# Patient Record
Sex: Male | Born: 1935
Health system: Southern US, Community
[De-identification: ages and names within clinical notes are randomized; demographics above are authoritative.]

## PROBLEM LIST (undated history)

## (undated) DIAGNOSIS — D759 Disease of blood and blood-forming organs, unspecified: Secondary | ICD-10-CM

## (undated) DIAGNOSIS — K219 Gastro-esophageal reflux disease without esophagitis: Secondary | ICD-10-CM

## (undated) DIAGNOSIS — R310 Gross hematuria: Secondary | ICD-10-CM

## (undated) DIAGNOSIS — I370 Nonrheumatic pulmonary valve stenosis: Secondary | ICD-10-CM

## (undated) DIAGNOSIS — I10 Essential (primary) hypertension: Secondary | ICD-10-CM

## (undated) DIAGNOSIS — N529 Male erectile dysfunction, unspecified: Secondary | ICD-10-CM

## (undated) DIAGNOSIS — Z87442 Personal history of urinary calculi: Secondary | ICD-10-CM

## (undated) DIAGNOSIS — N486 Induration penis plastica: Secondary | ICD-10-CM

## (undated) DIAGNOSIS — I480 Paroxysmal atrial fibrillation: Secondary | ICD-10-CM

## (undated) DIAGNOSIS — E785 Hyperlipidemia, unspecified: Secondary | ICD-10-CM

## (undated) HISTORY — PX: OTHER SURGICAL HISTORY: SHX169

## (undated) HISTORY — DX: Paroxysmal atrial fibrillation: I48.0

## (undated) HISTORY — DX: Induration penis plastica: N48.6

## (undated) HISTORY — PX: APPENDECTOMY: SHX54

## (undated) HISTORY — DX: Male erectile dysfunction, unspecified: N52.9

## (undated) HISTORY — DX: Hyperlipidemia, unspecified: E78.5

## (undated) HISTORY — DX: Gross hematuria: R31.0

## (undated) HISTORY — PX: CHOLECYSTECTOMY: SHX55

## (undated) HISTORY — PX: EYE SURGERY: SHX253

## (undated) HISTORY — DX: Nonrheumatic pulmonary valve stenosis: I37.0

---

## 1999-04-10 ENCOUNTER — Ambulatory Visit (HOSPITAL_COMMUNITY): Admission: RE | Admit: 1999-04-10 | Discharge: 1999-04-10 | Payer: Self-pay | Admitting: Internal Medicine

## 2003-12-23 ENCOUNTER — Ambulatory Visit (HOSPITAL_COMMUNITY): Admission: RE | Admit: 2003-12-23 | Discharge: 2003-12-23 | Payer: Self-pay | Admitting: Internal Medicine

## 2004-08-04 ENCOUNTER — Encounter (HOSPITAL_COMMUNITY): Admission: RE | Admit: 2004-08-04 | Discharge: 2004-11-02 | Payer: Self-pay | Admitting: Internal Medicine

## 2005-09-19 ENCOUNTER — Ambulatory Visit: Payer: Self-pay | Admitting: Oncology

## 2006-04-12 ENCOUNTER — Ambulatory Visit: Payer: Self-pay | Admitting: Oncology

## 2006-07-07 ENCOUNTER — Ambulatory Visit: Payer: Self-pay | Admitting: Oncology

## 2007-08-03 ENCOUNTER — Ambulatory Visit: Payer: Self-pay | Admitting: Oncology

## 2008-03-15 ENCOUNTER — Ambulatory Visit: Payer: Self-pay | Admitting: Oncology

## 2008-05-23 ENCOUNTER — Ambulatory Visit: Payer: Self-pay | Admitting: Oncology

## 2009-08-07 ENCOUNTER — Ambulatory Visit: Payer: Self-pay | Admitting: Oncology

## 2010-01-08 ENCOUNTER — Ambulatory Visit: Payer: Self-pay | Admitting: Oncology

## 2010-06-24 ENCOUNTER — Encounter (HOSPITAL_COMMUNITY): Payer: Self-pay

## 2010-06-25 ENCOUNTER — Encounter (HOSPITAL_COMMUNITY): Payer: Self-pay

## 2010-06-25 ENCOUNTER — Ambulatory Visit (HOSPITAL_COMMUNITY): Payer: Medicare Other | Attending: Internal Medicine

## 2010-07-07 ENCOUNTER — Ambulatory Visit (HOSPITAL_COMMUNITY): Payer: Medicare Other | Attending: Internal Medicine

## 2010-11-24 ENCOUNTER — Ambulatory Visit (HOSPITAL_COMMUNITY): Payer: Medicare Other | Attending: Internal Medicine

## 2011-06-14 DIAGNOSIS — H401390 Pigmentary glaucoma, unspecified eye, stage unspecified: Secondary | ICD-10-CM | POA: Diagnosis not present

## 2011-06-23 DIAGNOSIS — Z125 Encounter for screening for malignant neoplasm of prostate: Secondary | ICD-10-CM | POA: Diagnosis not present

## 2011-06-23 DIAGNOSIS — K4090 Unilateral inguinal hernia, without obstruction or gangrene, not specified as recurrent: Secondary | ICD-10-CM | POA: Diagnosis not present

## 2011-06-23 DIAGNOSIS — I10 Essential (primary) hypertension: Secondary | ICD-10-CM | POA: Diagnosis not present

## 2011-06-23 DIAGNOSIS — Z0000 Encounter for general adult medical examination without abnormal findings: Secondary | ICD-10-CM | POA: Diagnosis not present

## 2011-06-23 DIAGNOSIS — E785 Hyperlipidemia, unspecified: Secondary | ICD-10-CM | POA: Diagnosis not present

## 2011-07-07 ENCOUNTER — Other Ambulatory Visit (HOSPITAL_COMMUNITY): Payer: Self-pay | Admitting: *Deleted

## 2011-07-09 ENCOUNTER — Encounter (HOSPITAL_COMMUNITY)
Admission: RE | Admit: 2011-07-09 | Discharge: 2011-07-09 | Disposition: A | Discharge status: Home / Self Care | Payer: Medicare Other | Source: Ambulatory Visit | Attending: Internal Medicine | Admitting: Internal Medicine

## 2011-07-09 ENCOUNTER — Encounter (HOSPITAL_COMMUNITY): Payer: Self-pay

## 2011-07-09 HISTORY — DX: Essential (primary) hypertension: I10

## 2011-07-09 HISTORY — DX: Disease of blood and blood-forming organs, unspecified: D75.9

## 2011-07-09 NOTE — Procedures (Signed)
Phlebotomized for 500cc whole blood over 15 minutes. Pt tolerated well.

## 2011-07-09 NOTE — Discharge Instructions (Signed)

## 2011-07-20 ENCOUNTER — Encounter (INDEPENDENT_AMBULATORY_CARE_PROVIDER_SITE_OTHER): Payer: Self-pay

## 2011-07-20 DIAGNOSIS — K4090 Unilateral inguinal hernia, without obstruction or gangrene, not specified as recurrent: Secondary | ICD-10-CM | POA: Insufficient documentation

## 2011-07-21 ENCOUNTER — Ambulatory Visit (INDEPENDENT_AMBULATORY_CARE_PROVIDER_SITE_OTHER): Payer: Medicare Other | Admitting: Surgery

## 2011-07-21 ENCOUNTER — Encounter (INDEPENDENT_AMBULATORY_CARE_PROVIDER_SITE_OTHER): Payer: Self-pay | Admitting: Surgery

## 2011-07-21 DIAGNOSIS — K4090 Unilateral inguinal hernia, without obstruction or gangrene, not specified as recurrent: Secondary | ICD-10-CM

## 2011-07-21 NOTE — Progress Notes (Signed)
Re:   [name] DOB:   [birth year] MRN:   [id number]  ASSESSMENT AND PLAN: 1.  Left inguinal hernia.  Reducible.  I discussed the indications and complications of hernia surgery with the patient.  I discussed both the laparoscopic and open approach to hernia repair..  The potential risks of hernia surgery include, but are not limited to, bleeding, infection, open surgery, nerve injury, and recurrence of the hernia.  I provided the patient literature about hernia surgery.  He is undecided about laparoscopic vs open hernia repair - he has literature for both.  He is accompanied with his wife.  He is [place] trip [place] in about one month and he is trying to decide the timing of the hernia repair.  2.  HTN 3.  Hemochromatosis.  He gets phlebotomies every 6 months. 4.  Hyperlipidemia. 5.  Nephrolithiasis.  But has not had trouble in some time. 6.  Left groin mass - lipoma vs node - benign 7.  Small hernia at RLQ incision site.  Chief Complaint  Patient presents with  . Inguinal Hernia    left   REFERRING PHYSICIAN: Lillia Mountain, MD, MD  HISTORY OF PRESENT ILLNESS: Brian Horn is a 76 y.o. (DOB: [birth year])  white male whose primary care physician is Lillia Mountain, MD, MD and comes to me today for left inguinal hernia.  He noticed the hernia about 6 months ago.  It bothers him a little, but not a lot.  It is reducible.  It does not influence his activity. He has had no prior hernia repair.  No history of stomach disease.  No history of liver disease.  Open cholecystectomy - [id number].  Open appendectomy -  1970's. No pancreas disease.  No history of colon disease.  Had colonoscopy about 5 years ago, but cannot remember the GI doctor who did the procedure.   Past Medical History  Diagnosis Date  . Blood dyscrasia     hemachromatosis  . Hypertension   . Glaucoma   . ED (erectile dysfunction)   . Hyperlipidemia   . Pulmonic stenosis     mild  .  Paroxysmal atrial fibrillation   . Nephrolithiasis   . Tinnitus   . Hematuria, gross   . Renal cyst, left   . Peyronie's disease   . Inguinal hernia       Past Surgical History  Procedure Date  . Appendectomy   . Cholecystectomy   . Eye surgery       Current Outpatient Prescriptions  Medication Sig Dispense Refill  . amLODipine-benazepril (LOTREL) 10-20 MG per capsule Take 1 capsule by mouth daily.      . Aspirin 81 MG EC tablet Take 81 mg by mouth daily.      . lansoprazole (PREVACID) 15 MG capsule Take 15 mg by mouth daily before breakfast.      . latanoprost (XALATAN) 0.005 % ophthalmic solution 1 drop at bedtime.      . Multiple Vitamins-Minerals (MULTIVITAMIN PO) Take by mouth as directed.      . simvastatin (ZOCOR) 40 MG tablet Take 40 mg by mouth every evening.      . vardenafil (LEVITRA) 20 MG tablet Take 20 mg by mouth daily as needed.          Allergies  Allergen Reactions  . Lipitor (Atorvastatin Calcium)     cramping    REVIEW OF SYSTEMS: Skin:  No history of rash.  No history of abnormal moles.  Infection:  No history of hepatitis or HIV.  No history of MRSA. Neurologic:  No history of stroke.  No history of seizure.  No history of headaches. Cardiac:  Hpertension. No history of heart disease.  No history of prior cardiac catheterization.  Pulmonary:  Does not smoke cigarettes.  No asthma or bronchitis.  No OSA/CPAP.  Endocrine:  No diabetes. No thyroid disease.  Hypercholesterolemia. Gastrointestinal:  See HPI. Urologic:  Nephrolithiasis. Musculoskeletal:  No history of joint or back disease. Hematologic:  Hemochromatosis - does phlebotomy about twice a year. Psycho-social:  The patient is oriented.   The patient has no obvious psychologic or social impairment to understanding our conversation and plan.  SOCIAL and FAMILY HISTORY: Married.  Wife, Mitzi Davenport, with patient.  PHYSICAL EXAM: BP 122/82  Pulse 72  Temp(Src) 98 F (36.7 C) (Temporal)  Resp  18  Ht 6' (1.829 m)  Wt 184 lb (83.462 kg)  BMI 24.95 kg/m2  General: WN older WM who is alert and generally healthy appearing.  HEENT: Normal. Pupils equal. Good dentition. Neck: Supple. No mass.  No thyroid mass.  Carotid pulse okay with no bruit. Lymph Nodes:  No supraclavicular or cervical nodes. Lungs: Clear to auscultation and symmetric breath sounds. Heart:  RRR. No murmur or rub.  Abdomen: Soft. No mass. No tenderness. Small to medium left inguinal hernia.  Left inguinal mass he has had for 15 years (lipoma vs lymph node) - feels benign.  RUQ incision and RLQ incision (appendectomy) with small hernia.  Rectal: Not done. Extremities:  Good strength and ROM  in upper and lower extremities. Neurologic:  Grossly intact to motor and sensory function. Psychiatric: Has normal mood and affect. Behavior is normal.   DATA REVIEWED: Dr. Jone Baseman notes.  Ovidio Kin, MD,  [facility] [address]  [address]   [place], [place]    [address] Phone:  [phone] FAX:  [phone]

## 2011-08-06 DIAGNOSIS — K4090 Unilateral inguinal hernia, without obstruction or gangrene, not specified as recurrent: Secondary | ICD-10-CM | POA: Diagnosis not present

## 2011-08-09 ENCOUNTER — Encounter (INDEPENDENT_AMBULATORY_CARE_PROVIDER_SITE_OTHER): Payer: Self-pay | Admitting: Surgery

## 2011-08-19 ENCOUNTER — Ambulatory Visit (INDEPENDENT_AMBULATORY_CARE_PROVIDER_SITE_OTHER): Payer: Medicare Other | Admitting: Surgery

## 2011-08-19 DIAGNOSIS — K4090 Unilateral inguinal hernia, without obstruction or gangrene, not specified as recurrent: Secondary | ICD-10-CM

## 2011-08-19 NOTE — Progress Notes (Signed)
Post op visit  Laparoscopic left inguinal hernia repair at [facility] on 08/06/2011.  BP 130/82  Pulse 72  Temp(Src) 97.1 F (36.2 C) (Temporal)  Resp 12  Ht 6' (1.829 m)  Wt 181 lb 8 oz (82.328 kg)  BMI 24.62 kg/m2  Had bruising of scrotum/penis early.  That is better.  He is other wise doing well.  Return to office PRN.  Ovidio Kin, MD, [facility] Pager: [phone] Office phone:  [phone]

## 2011-11-12 DIAGNOSIS — H401390 Pigmentary glaucoma, unspecified eye, stage unspecified: Secondary | ICD-10-CM | POA: Diagnosis not present

## 2011-11-12 DIAGNOSIS — Z961 Presence of intraocular lens: Secondary | ICD-10-CM | POA: Diagnosis not present

## 2011-11-24 DIAGNOSIS — N486 Induration penis plastica: Secondary | ICD-10-CM | POA: Diagnosis not present

## 2011-11-24 DIAGNOSIS — N529 Male erectile dysfunction, unspecified: Secondary | ICD-10-CM | POA: Diagnosis not present

## 2011-12-21 DIAGNOSIS — IMO0001 Reserved for inherently not codable concepts without codable children: Secondary | ICD-10-CM | POA: Diagnosis not present

## 2011-12-21 DIAGNOSIS — I10 Essential (primary) hypertension: Secondary | ICD-10-CM | POA: Diagnosis not present

## 2011-12-21 DIAGNOSIS — K219 Gastro-esophageal reflux disease without esophagitis: Secondary | ICD-10-CM | POA: Diagnosis not present

## 2011-12-21 DIAGNOSIS — N529 Male erectile dysfunction, unspecified: Secondary | ICD-10-CM | POA: Diagnosis not present

## 2012-02-05 DIAGNOSIS — Z23 Encounter for immunization: Secondary | ICD-10-CM | POA: Diagnosis not present

## 2012-02-29 DIAGNOSIS — M25569 Pain in unspecified knee: Secondary | ICD-10-CM | POA: Diagnosis not present

## 2012-04-14 ENCOUNTER — Encounter (HOSPITAL_COMMUNITY): Payer: Medicare Other

## 2012-04-27 ENCOUNTER — Encounter (HOSPITAL_COMMUNITY): Payer: Medicare Other

## 2012-05-02 ENCOUNTER — Other Ambulatory Visit (HOSPITAL_COMMUNITY): Payer: Self-pay | Admitting: Internal Medicine

## 2012-05-04 ENCOUNTER — Encounter (HOSPITAL_COMMUNITY)
Admission: RE | Admit: 2012-05-04 | Discharge: 2012-05-04 | Disposition: A | Discharge status: Home / Self Care | Payer: Medicare Other | Source: Ambulatory Visit | Attending: Internal Medicine | Admitting: Internal Medicine

## 2012-05-04 NOTE — Procedures (Addendum)
After 2 attempts at peripheral access was able to only withdraw 200 cc of blood. Pt wishes to leave at this time because he is feeling fine and has an upcomming appointment with Dr Valentina Lucks to have his ferritin level checked . Placed call to Dr Valentina Lucks and is OK for patient to leave with only obtaining 200 cc blood. Peripheral catheter removed with cath intact and dressing placed at site

## 2012-05-10 DIAGNOSIS — H401390 Pigmentary glaucoma, unspecified eye, stage unspecified: Secondary | ICD-10-CM | POA: Diagnosis not present

## 2012-05-10 DIAGNOSIS — H409 Unspecified glaucoma: Secondary | ICD-10-CM | POA: Diagnosis not present

## 2012-06-20 DIAGNOSIS — E785 Hyperlipidemia, unspecified: Secondary | ICD-10-CM | POA: Diagnosis not present

## 2012-06-20 DIAGNOSIS — I10 Essential (primary) hypertension: Secondary | ICD-10-CM | POA: Diagnosis not present

## 2012-06-20 DIAGNOSIS — N529 Male erectile dysfunction, unspecified: Secondary | ICD-10-CM | POA: Diagnosis not present

## 2012-06-20 DIAGNOSIS — K219 Gastro-esophageal reflux disease without esophagitis: Secondary | ICD-10-CM | POA: Diagnosis not present

## 2012-07-18 DIAGNOSIS — I10 Essential (primary) hypertension: Secondary | ICD-10-CM | POA: Diagnosis not present

## 2012-11-14 DIAGNOSIS — H409 Unspecified glaucoma: Secondary | ICD-10-CM | POA: Diagnosis not present

## 2012-11-14 DIAGNOSIS — H26499 Other secondary cataract, unspecified eye: Secondary | ICD-10-CM | POA: Diagnosis not present

## 2012-11-14 DIAGNOSIS — H401390 Pigmentary glaucoma, unspecified eye, stage unspecified: Secondary | ICD-10-CM | POA: Diagnosis not present

## 2012-11-21 DIAGNOSIS — H26499 Other secondary cataract, unspecified eye: Secondary | ICD-10-CM | POA: Diagnosis not present

## 2012-11-21 DIAGNOSIS — H2640 Unspecified secondary cataract: Secondary | ICD-10-CM | POA: Diagnosis not present

## 2012-12-07 DIAGNOSIS — Z1331 Encounter for screening for depression: Secondary | ICD-10-CM | POA: Diagnosis not present

## 2012-12-07 DIAGNOSIS — I10 Essential (primary) hypertension: Secondary | ICD-10-CM | POA: Diagnosis not present

## 2013-02-12 DIAGNOSIS — Z23 Encounter for immunization: Secondary | ICD-10-CM | POA: Diagnosis not present

## 2013-02-13 DIAGNOSIS — N401 Enlarged prostate with lower urinary tract symptoms: Secondary | ICD-10-CM | POA: Diagnosis not present

## 2013-02-13 DIAGNOSIS — R3129 Other microscopic hematuria: Secondary | ICD-10-CM | POA: Diagnosis not present

## 2013-02-13 DIAGNOSIS — N529 Male erectile dysfunction, unspecified: Secondary | ICD-10-CM | POA: Diagnosis not present

## 2013-02-13 DIAGNOSIS — N138 Other obstructive and reflux uropathy: Secondary | ICD-10-CM | POA: Diagnosis not present

## 2013-03-28 DIAGNOSIS — N139 Obstructive and reflux uropathy, unspecified: Secondary | ICD-10-CM | POA: Diagnosis not present

## 2013-03-28 DIAGNOSIS — N529 Male erectile dysfunction, unspecified: Secondary | ICD-10-CM | POA: Diagnosis not present

## 2013-03-28 DIAGNOSIS — N401 Enlarged prostate with lower urinary tract symptoms: Secondary | ICD-10-CM | POA: Diagnosis not present

## 2013-03-28 DIAGNOSIS — N138 Other obstructive and reflux uropathy: Secondary | ICD-10-CM | POA: Diagnosis not present

## 2013-06-29 DIAGNOSIS — I10 Essential (primary) hypertension: Secondary | ICD-10-CM | POA: Diagnosis not present

## 2013-06-29 DIAGNOSIS — L989 Disorder of the skin and subcutaneous tissue, unspecified: Secondary | ICD-10-CM | POA: Diagnosis not present

## 2013-06-29 DIAGNOSIS — Z125 Encounter for screening for malignant neoplasm of prostate: Secondary | ICD-10-CM | POA: Diagnosis not present

## 2013-06-29 DIAGNOSIS — E785 Hyperlipidemia, unspecified: Secondary | ICD-10-CM | POA: Diagnosis not present

## 2013-06-29 DIAGNOSIS — Z23 Encounter for immunization: Secondary | ICD-10-CM | POA: Diagnosis not present

## 2013-06-29 DIAGNOSIS — Z0000 Encounter for general adult medical examination without abnormal findings: Secondary | ICD-10-CM | POA: Diagnosis not present

## 2013-06-29 DIAGNOSIS — K219 Gastro-esophageal reflux disease without esophagitis: Secondary | ICD-10-CM | POA: Diagnosis not present

## 2013-06-29 DIAGNOSIS — R972 Elevated prostate specific antigen [PSA]: Secondary | ICD-10-CM | POA: Diagnosis not present

## 2013-06-29 DIAGNOSIS — Z1331 Encounter for screening for depression: Secondary | ICD-10-CM | POA: Diagnosis not present

## 2013-07-02 DIAGNOSIS — D239 Other benign neoplasm of skin, unspecified: Secondary | ICD-10-CM | POA: Diagnosis not present

## 2013-07-02 DIAGNOSIS — D1801 Hemangioma of skin and subcutaneous tissue: Secondary | ICD-10-CM | POA: Diagnosis not present

## 2013-07-02 DIAGNOSIS — L821 Other seborrheic keratosis: Secondary | ICD-10-CM | POA: Diagnosis not present

## 2013-07-02 DIAGNOSIS — C4359 Malignant melanoma of other part of trunk: Secondary | ICD-10-CM | POA: Diagnosis not present

## 2013-07-10 DIAGNOSIS — H409 Unspecified glaucoma: Secondary | ICD-10-CM | POA: Diagnosis not present

## 2013-07-10 DIAGNOSIS — Z961 Presence of intraocular lens: Secondary | ICD-10-CM | POA: Diagnosis not present

## 2013-07-10 DIAGNOSIS — H2640 Unspecified secondary cataract: Secondary | ICD-10-CM | POA: Diagnosis not present

## 2013-07-10 DIAGNOSIS — H401390 Pigmentary glaucoma, unspecified eye, stage unspecified: Secondary | ICD-10-CM | POA: Diagnosis not present

## 2013-07-17 DIAGNOSIS — C4359 Malignant melanoma of other part of trunk: Secondary | ICD-10-CM | POA: Diagnosis not present

## 2013-07-24 ENCOUNTER — Other Ambulatory Visit (HOSPITAL_COMMUNITY): Payer: Self-pay | Admitting: *Deleted

## 2013-07-24 ENCOUNTER — Encounter (HOSPITAL_COMMUNITY)
Admission: RE | Admit: 2013-07-24 | Discharge: 2013-07-24 | Disposition: A | Discharge status: Home / Self Care | Payer: Medicare Other | Source: Ambulatory Visit | Attending: Internal Medicine | Admitting: Internal Medicine

## 2013-07-24 ENCOUNTER — Encounter (HOSPITAL_COMMUNITY): Payer: Self-pay

## 2013-07-24 ENCOUNTER — Other Ambulatory Visit (HOSPITAL_COMMUNITY): Payer: Self-pay | Admitting: Internal Medicine

## 2013-07-24 NOTE — Procedures (Signed)
Therapeutic phlebotomy started at 1306, 500 cc of blood removed, completed at 1320, pt. Tolerated procedure well.

## 2013-07-24 NOTE — Discharge Instructions (Signed)
Therapeutic Phlebotomy Therapeutic phlebotomy is the controlled removal of blood from your body for the purpose of treating a medical condition. It is similar to donating blood. Usually, about a pint (470 mL) of blood is removed. The average adult has 9 to 12 pints (4.3 to 5.7 L) of blood. Therapeutic phlebotomy may be used to treat the following medical conditions:  Hemochromatosis. This is a condition in which there is too much iron in the blood.  Polycythemia vera. This is a condition in which there are too many red cells in the blood.  Porphyria cutanea tarda. This is a disease usually passed from one generation to the next (inherited). It is a condition in which an important part of hemoglobin is not made properly. This results in the build up of abnormal amounts of porphyrins in the body.  Sickle cell disease. This is an inherited disease. It is a condition in which the red blood cells form an abnormal crescent shape rather than a round shape. LET YOUR CAREGIVER KNOW ABOUT:  Allergies.  Medicines taken including herbs, eyedrops, over-the-counter medicines, and creams.  Use of steroids (by mouth or creams).  Previous problems with anesthetics or numbing medicine.  History of blood clots.  History of bleeding or blood problems.  Previous surgery.  Possibility of pregnancy, if this applies. RISKS AND COMPLICATIONS This is a simple and safe procedure. Problems are unlikely. However, problems can occur and may include:  Nausea or lightheadedness.  Low blood pressure.  Soreness, bleeding, swelling, or bruising at the needle insertion site.  Infection. BEFORE THE PROCEDURE  This is a procedure that can be done as an outpatient. Confirm the time that you need to arrive for your procedure. Confirm whether there is a need to fast or withhold any medications. It is helpful to wear clothing with sleeves that can be raised above the elbow. A blood sample may be done to determine the  amount of red blood cells or iron in your blood. Plan ahead of time to have someone drive you home after the procedure. PROCEDURE The entire procedure from preparation through recovery takes about 1 hour. The actual collection takes about 10 to 15 minutes.  A needle will be inserted into your vein.  Tubing and a collection bag will be attached to that needle.  Blood will flow through the needle and tubing into the collection bag.  You may be asked to open and close your hand slowly and continuously during the entire collection.  Once the specified amount of blood has been removed from your body, the collection bag and tubing will be clamped.  The needle will be removed.  Pressure will be held on the site of the needle insertion to stop the bleeding. Then a bandage will be placed over the needle insertion site. AFTER THE PROCEDURE  Your recovery will be assessed and monitored. If there are no problems, as an outpatient, you should be able to go home shortly after the procedure.  Document Released: 09/14/2010 Document Revised: 07/05/2011 Document Reviewed: 09/14/2010 ExitCare Patient Information 2014 [employer].  

## 2013-08-05 ENCOUNTER — Encounter: Payer: Self-pay | Admitting: *Deleted

## 2013-10-22 ENCOUNTER — Other Ambulatory Visit (HOSPITAL_COMMUNITY): Payer: Self-pay | Admitting: Internal Medicine

## 2013-10-23 ENCOUNTER — Encounter (HOSPITAL_COMMUNITY)
Admission: RE | Admit: 2013-10-23 | Discharge: 2013-10-23 | Disposition: A | Discharge status: Home / Self Care | Payer: Medicare Other | Source: Ambulatory Visit | Attending: Internal Medicine | Admitting: Internal Medicine

## 2013-10-23 ENCOUNTER — Encounter (HOSPITAL_COMMUNITY): Payer: Self-pay

## 2013-10-23 NOTE — Discharge Instructions (Signed)
Therapeutic Phlebotomy Care After Refer to this sheet in the next few weeks. These instructions provide you with information on caring for yourself after your procedure. Your caregiver may also give you more specific instructions. Your treatment has been planned according to current medical practices, but problems sometimes occur. Call your caregiver if you have any problems or questions after your procedure. HOME CARE INSTRUCTIONS Most people can go back to their normal activities right away. Before you leave, be sure to ask if there is anything you should or should not do. In general, it would be wise to:  Keep the bandage dry. You can remove the bandage after about 5 hours.  Eat well-balanced meals for the next 24 hours.  Drink enough fluids to keep your urine clear or pale yellow.  Avoid drinking alcohol minimally until after eating.  Avoid smoking for at least 30 minutes after the procedure.  Avoid strenous physical activity or heavy lifting or pulling for about 5 hours after the procedure.  Athletes should avoid strenous exercise for 12 hours or more.  Change positions slowly for the remainder of the day to prevent lightheadedness or fainting.  If you feel lightheaded, lie down until the feeling subsides.  If you have bleeding from the needle insertion site, elevate your arm and press firmly on the site until the bleeding stops.  If bruising or bleeding appears under the skin, apply ice to the area for 15 to 20 minutes, 3 to 4 times per day. Put the ice in a plastic bag and place a towel between the bag of ice and your skin. Do this while you are awake for the first 24 hours. The ice packs can be stopped before 24 hours if the swelling goes away. If swelling persists after 24 hours, a warm, moist washcloth can be applied to the area for 15 to 20 minutes, 3 to 4 times per day. The warm, moist treatments can be stopped when the swelling goes away.  It is important to continue further  therapeutic phlebotomy as directed by your caregiver. SEEK MEDICAL CARE IF:  There is bleeding or fluid leaking from the needle insertion site.  The needle insertion site becomes swollen, red, or sore.  You feel lightheaded, dizzy or nauseated, and the feeling does not go away.  You notice new bruising at the needle insertion site.  You feel more weak or tired than normal.  You develop a fever. SEEK IMMEDIATE MEDICAL CARE IF:   There is increased bleeding, pain, or swelling from the needle insertion site.  You have severe nausea or vomiting.  You have chest pain.  You have trouble breathing. MAKE SURE YOU:  Understand these instructions.  Will watch your condition.  Will get help right away if you are not doing well or get worse. Document Released: 09/14/2010 Document Revised: 07/05/2011 Document Reviewed: 09/14/2010 ExitCare Patient Information 2015 [employer]. This information is not intended to replace advice given to you by your health care provider. Make sure you discuss any questions you have with your health care provider.  

## 2013-10-23 NOTE — Procedures (Signed)
Therapeutic Phlebotomy start at 1307, 545ml of blood removed, completed at 1331. Pt. Tolerated procedure well.

## 2014-01-02 DIAGNOSIS — I10 Essential (primary) hypertension: Secondary | ICD-10-CM | POA: Diagnosis not present

## 2014-01-02 DIAGNOSIS — K4090 Unilateral inguinal hernia, without obstruction or gangrene, not specified as recurrent: Secondary | ICD-10-CM | POA: Diagnosis not present

## 2014-01-02 DIAGNOSIS — K219 Gastro-esophageal reflux disease without esophagitis: Secondary | ICD-10-CM | POA: Diagnosis not present

## 2014-01-02 DIAGNOSIS — R7301 Impaired fasting glucose: Secondary | ICD-10-CM | POA: Diagnosis not present

## 2014-01-02 DIAGNOSIS — R972 Elevated prostate specific antigen [PSA]: Secondary | ICD-10-CM | POA: Diagnosis not present

## 2014-01-02 DIAGNOSIS — Z23 Encounter for immunization: Secondary | ICD-10-CM | POA: Diagnosis not present

## 2014-01-09 DIAGNOSIS — L821 Other seborrheic keratosis: Secondary | ICD-10-CM | POA: Diagnosis not present

## 2014-01-09 DIAGNOSIS — D1801 Hemangioma of skin and subcutaneous tissue: Secondary | ICD-10-CM | POA: Diagnosis not present

## 2014-01-09 DIAGNOSIS — L819 Disorder of pigmentation, unspecified: Secondary | ICD-10-CM | POA: Diagnosis not present

## 2014-01-09 DIAGNOSIS — L570 Actinic keratosis: Secondary | ICD-10-CM | POA: Diagnosis not present

## 2014-01-09 DIAGNOSIS — Z85820 Personal history of malignant melanoma of skin: Secondary | ICD-10-CM | POA: Diagnosis not present

## 2014-01-09 DIAGNOSIS — D235 Other benign neoplasm of skin of trunk: Secondary | ICD-10-CM | POA: Diagnosis not present

## 2014-01-09 DIAGNOSIS — D239 Other benign neoplasm of skin, unspecified: Secondary | ICD-10-CM | POA: Diagnosis not present

## 2014-01-14 DIAGNOSIS — H401390 Pigmentary glaucoma, unspecified eye, stage unspecified: Secondary | ICD-10-CM | POA: Diagnosis not present

## 2014-01-14 DIAGNOSIS — H409 Unspecified glaucoma: Secondary | ICD-10-CM | POA: Diagnosis not present

## 2014-01-14 DIAGNOSIS — Z961 Presence of intraocular lens: Secondary | ICD-10-CM | POA: Diagnosis not present

## 2014-03-07 DIAGNOSIS — K4090 Unilateral inguinal hernia, without obstruction or gangrene, not specified as recurrent: Secondary | ICD-10-CM | POA: Diagnosis not present

## 2014-04-01 DIAGNOSIS — K4090 Unilateral inguinal hernia, without obstruction or gangrene, not specified as recurrent: Secondary | ICD-10-CM | POA: Diagnosis not present

## 2014-04-01 DIAGNOSIS — K469 Unspecified abdominal hernia without obstruction or gangrene: Secondary | ICD-10-CM | POA: Diagnosis not present

## 2014-07-10 DIAGNOSIS — K219 Gastro-esophageal reflux disease without esophagitis: Secondary | ICD-10-CM | POA: Diagnosis not present

## 2014-07-10 DIAGNOSIS — N529 Male erectile dysfunction, unspecified: Secondary | ICD-10-CM | POA: Diagnosis not present

## 2014-07-10 DIAGNOSIS — I10 Essential (primary) hypertension: Secondary | ICD-10-CM | POA: Diagnosis not present

## 2014-07-10 DIAGNOSIS — Z1389 Encounter for screening for other disorder: Secondary | ICD-10-CM | POA: Diagnosis not present

## 2014-07-10 DIAGNOSIS — R972 Elevated prostate specific antigen [PSA]: Secondary | ICD-10-CM | POA: Diagnosis not present

## 2014-07-10 DIAGNOSIS — E780 Pure hypercholesterolemia: Secondary | ICD-10-CM | POA: Diagnosis not present

## 2014-07-25 DIAGNOSIS — L821 Other seborrheic keratosis: Secondary | ICD-10-CM | POA: Diagnosis not present

## 2014-07-25 DIAGNOSIS — D1801 Hemangioma of skin and subcutaneous tissue: Secondary | ICD-10-CM | POA: Diagnosis not present

## 2014-07-25 DIAGNOSIS — Z85820 Personal history of malignant melanoma of skin: Secondary | ICD-10-CM | POA: Diagnosis not present

## 2014-07-29 DIAGNOSIS — H26492 Other secondary cataract, left eye: Secondary | ICD-10-CM | POA: Diagnosis not present

## 2014-07-29 DIAGNOSIS — H401333 Pigmentary glaucoma, bilateral, severe stage: Secondary | ICD-10-CM | POA: Diagnosis not present

## 2014-08-22 DIAGNOSIS — H26492 Other secondary cataract, left eye: Secondary | ICD-10-CM | POA: Diagnosis not present

## 2014-09-03 DIAGNOSIS — N138 Other obstructive and reflux uropathy: Secondary | ICD-10-CM | POA: Diagnosis not present

## 2014-09-03 DIAGNOSIS — N401 Enlarged prostate with lower urinary tract symptoms: Secondary | ICD-10-CM | POA: Diagnosis not present

## 2014-09-03 DIAGNOSIS — N5201 Erectile dysfunction due to arterial insufficiency: Secondary | ICD-10-CM | POA: Diagnosis not present

## 2015-01-21 DIAGNOSIS — I10 Essential (primary) hypertension: Secondary | ICD-10-CM | POA: Diagnosis not present

## 2015-01-21 DIAGNOSIS — Z23 Encounter for immunization: Secondary | ICD-10-CM | POA: Diagnosis not present

## 2015-01-30 DIAGNOSIS — Q828 Other specified congenital malformations of skin: Secondary | ICD-10-CM | POA: Diagnosis not present

## 2015-01-30 DIAGNOSIS — L821 Other seborrheic keratosis: Secondary | ICD-10-CM | POA: Diagnosis not present

## 2015-01-30 DIAGNOSIS — D1801 Hemangioma of skin and subcutaneous tissue: Secondary | ICD-10-CM | POA: Diagnosis not present

## 2015-01-30 DIAGNOSIS — D225 Melanocytic nevi of trunk: Secondary | ICD-10-CM | POA: Diagnosis not present

## 2015-01-30 DIAGNOSIS — L814 Other melanin hyperpigmentation: Secondary | ICD-10-CM | POA: Diagnosis not present

## 2015-01-30 DIAGNOSIS — Z85820 Personal history of malignant melanoma of skin: Secondary | ICD-10-CM | POA: Diagnosis not present

## 2015-05-05 DIAGNOSIS — H401313 Pigmentary glaucoma, right eye, severe stage: Secondary | ICD-10-CM | POA: Diagnosis not present

## 2015-05-05 DIAGNOSIS — H401323 Pigmentary glaucoma, left eye, severe stage: Secondary | ICD-10-CM | POA: Diagnosis not present

## 2015-08-01 DIAGNOSIS — Z125 Encounter for screening for malignant neoplasm of prostate: Secondary | ICD-10-CM | POA: Diagnosis not present

## 2015-08-01 DIAGNOSIS — N529 Male erectile dysfunction, unspecified: Secondary | ICD-10-CM | POA: Diagnosis not present

## 2015-08-01 DIAGNOSIS — I10 Essential (primary) hypertension: Secondary | ICD-10-CM | POA: Diagnosis not present

## 2015-08-01 DIAGNOSIS — N401 Enlarged prostate with lower urinary tract symptoms: Secondary | ICD-10-CM | POA: Diagnosis not present

## 2015-08-01 DIAGNOSIS — K219 Gastro-esophageal reflux disease without esophagitis: Secondary | ICD-10-CM | POA: Diagnosis not present

## 2015-08-01 DIAGNOSIS — E7800 Pure hypercholesterolemia, unspecified: Secondary | ICD-10-CM | POA: Diagnosis not present

## 2015-08-01 DIAGNOSIS — Z0000 Encounter for general adult medical examination without abnormal findings: Secondary | ICD-10-CM | POA: Diagnosis not present

## 2015-08-01 DIAGNOSIS — I370 Nonrheumatic pulmonary valve stenosis: Secondary | ICD-10-CM | POA: Diagnosis not present

## 2015-08-01 DIAGNOSIS — Z1389 Encounter for screening for other disorder: Secondary | ICD-10-CM | POA: Diagnosis not present

## 2015-08-04 ENCOUNTER — Other Ambulatory Visit (HOSPITAL_COMMUNITY): Payer: Self-pay | Admitting: Internal Medicine

## 2015-08-04 DIAGNOSIS — I370 Nonrheumatic pulmonary valve stenosis: Secondary | ICD-10-CM

## 2015-08-27 ENCOUNTER — Ambulatory Visit (HOSPITAL_COMMUNITY): Payer: Medicare Other | Attending: Internal Medicine

## 2015-08-27 ENCOUNTER — Other Ambulatory Visit: Payer: Self-pay

## 2015-08-27 DIAGNOSIS — I5189 Other ill-defined heart diseases: Secondary | ICD-10-CM | POA: Diagnosis not present

## 2015-08-27 DIAGNOSIS — I517 Cardiomegaly: Secondary | ICD-10-CM | POA: Insufficient documentation

## 2015-08-27 DIAGNOSIS — I370 Nonrheumatic pulmonary valve stenosis: Secondary | ICD-10-CM

## 2015-08-27 DIAGNOSIS — I071 Rheumatic tricuspid insufficiency: Secondary | ICD-10-CM | POA: Diagnosis not present

## 2015-08-27 DIAGNOSIS — I358 Other nonrheumatic aortic valve disorders: Secondary | ICD-10-CM | POA: Insufficient documentation

## 2015-09-02 DIAGNOSIS — H401323 Pigmentary glaucoma, left eye, severe stage: Secondary | ICD-10-CM | POA: Diagnosis not present

## 2015-09-02 DIAGNOSIS — H401313 Pigmentary glaucoma, right eye, severe stage: Secondary | ICD-10-CM | POA: Diagnosis not present

## 2015-09-02 DIAGNOSIS — H52203 Unspecified astigmatism, bilateral: Secondary | ICD-10-CM | POA: Diagnosis not present

## 2015-09-10 DIAGNOSIS — H401113 Primary open-angle glaucoma, right eye, severe stage: Secondary | ICD-10-CM | POA: Diagnosis not present

## 2015-09-17 DIAGNOSIS — H401113 Primary open-angle glaucoma, right eye, severe stage: Secondary | ICD-10-CM | POA: Diagnosis not present

## 2015-09-18 ENCOUNTER — Encounter (HOSPITAL_COMMUNITY)
Admission: RE | Admit: 2015-09-18 | Discharge: 2015-09-18 | Disposition: A | Discharge status: Home / Self Care | Payer: Medicare Other | Source: Ambulatory Visit | Attending: Internal Medicine | Admitting: Internal Medicine

## 2015-09-18 ENCOUNTER — Encounter (HOSPITAL_COMMUNITY): Payer: Self-pay

## 2015-09-18 NOTE — Progress Notes (Signed)
Brian Horn presents today for phlebotomy per MD orders.  Phlebotomy procedure started at 1310 and ended at 1345.  500 cc removed. Patient tolerated procedure well. IV removed intact. Informed Pt of August appointment.

## 2015-12-18 ENCOUNTER — Encounter (HOSPITAL_COMMUNITY): Payer: Self-pay

## 2015-12-18 ENCOUNTER — Encounter (HOSPITAL_COMMUNITY)
Admission: RE | Admit: 2015-12-18 | Discharge: 2015-12-18 | Disposition: A | Discharge status: Home / Self Care | Payer: Medicare Other | Source: Ambulatory Visit | Attending: Internal Medicine | Admitting: Internal Medicine

## 2015-12-18 NOTE — Discharge Instructions (Signed)

## 2015-12-18 NOTE — Progress Notes (Signed)
Brian Horn presents today for phlebotomy per MD orders.  Phlebotomy procedure started at 1310 and ended at 1340.  500cc removed per order.  Patient tolerated procedure well. IV needle removed intact. Observed patient for 30 minutes afterwards.  VSS. D/c to home.

## 2016-01-01 DIAGNOSIS — R351 Nocturia: Secondary | ICD-10-CM | POA: Diagnosis not present

## 2016-01-01 DIAGNOSIS — N5201 Erectile dysfunction due to arterial insufficiency: Secondary | ICD-10-CM | POA: Diagnosis not present

## 2016-02-04 DIAGNOSIS — L570 Actinic keratosis: Secondary | ICD-10-CM | POA: Diagnosis not present

## 2016-02-04 DIAGNOSIS — Z85820 Personal history of malignant melanoma of skin: Secondary | ICD-10-CM | POA: Diagnosis not present

## 2016-02-04 DIAGNOSIS — K219 Gastro-esophageal reflux disease without esophagitis: Secondary | ICD-10-CM | POA: Diagnosis not present

## 2016-02-04 DIAGNOSIS — L820 Inflamed seborrheic keratosis: Secondary | ICD-10-CM | POA: Diagnosis not present

## 2016-02-04 DIAGNOSIS — L821 Other seborrheic keratosis: Secondary | ICD-10-CM | POA: Diagnosis not present

## 2016-02-04 DIAGNOSIS — D225 Melanocytic nevi of trunk: Secondary | ICD-10-CM | POA: Diagnosis not present

## 2016-02-04 DIAGNOSIS — N401 Enlarged prostate with lower urinary tract symptoms: Secondary | ICD-10-CM | POA: Diagnosis not present

## 2016-02-04 DIAGNOSIS — Z23 Encounter for immunization: Secondary | ICD-10-CM | POA: Diagnosis not present

## 2016-02-04 DIAGNOSIS — L814 Other melanin hyperpigmentation: Secondary | ICD-10-CM | POA: Diagnosis not present

## 2016-02-04 DIAGNOSIS — I10 Essential (primary) hypertension: Secondary | ICD-10-CM | POA: Diagnosis not present

## 2016-02-04 DIAGNOSIS — D1801 Hemangioma of skin and subcutaneous tissue: Secondary | ICD-10-CM | POA: Diagnosis not present

## 2016-03-31 DIAGNOSIS — H401113 Primary open-angle glaucoma, right eye, severe stage: Secondary | ICD-10-CM | POA: Diagnosis not present

## 2016-03-31 DIAGNOSIS — H401123 Primary open-angle glaucoma, left eye, severe stage: Secondary | ICD-10-CM | POA: Diagnosis not present

## 2016-07-28 DIAGNOSIS — J018 Other acute sinusitis: Secondary | ICD-10-CM | POA: Diagnosis not present

## 2016-08-03 DIAGNOSIS — I10 Essential (primary) hypertension: Secondary | ICD-10-CM | POA: Diagnosis not present

## 2016-08-03 DIAGNOSIS — Z1389 Encounter for screening for other disorder: Secondary | ICD-10-CM | POA: Diagnosis not present

## 2016-08-03 DIAGNOSIS — Z0000 Encounter for general adult medical examination without abnormal findings: Secondary | ICD-10-CM | POA: Diagnosis not present

## 2016-08-03 DIAGNOSIS — E7800 Pure hypercholesterolemia, unspecified: Secondary | ICD-10-CM | POA: Diagnosis not present

## 2016-08-03 DIAGNOSIS — K219 Gastro-esophageal reflux disease without esophagitis: Secondary | ICD-10-CM | POA: Diagnosis not present

## 2016-08-03 DIAGNOSIS — N401 Enlarged prostate with lower urinary tract symptoms: Secondary | ICD-10-CM | POA: Diagnosis not present

## 2016-08-03 DIAGNOSIS — I480 Paroxysmal atrial fibrillation: Secondary | ICD-10-CM | POA: Diagnosis not present

## 2016-08-18 DIAGNOSIS — H401133 Primary open-angle glaucoma, bilateral, severe stage: Secondary | ICD-10-CM | POA: Diagnosis not present

## 2016-08-18 DIAGNOSIS — Z961 Presence of intraocular lens: Secondary | ICD-10-CM | POA: Diagnosis not present

## 2016-08-18 DIAGNOSIS — H52203 Unspecified astigmatism, bilateral: Secondary | ICD-10-CM | POA: Diagnosis not present

## 2016-10-04 DIAGNOSIS — T1512XA Foreign body in conjunctival sac, left eye, initial encounter: Secondary | ICD-10-CM | POA: Diagnosis not present

## 2017-01-02 DIAGNOSIS — N390 Urinary tract infection, site not specified: Secondary | ICD-10-CM | POA: Diagnosis not present

## 2017-01-02 DIAGNOSIS — S20471A Other superficial bite of right back wall of thorax, initial encounter: Secondary | ICD-10-CM | POA: Diagnosis not present

## 2017-01-02 DIAGNOSIS — R21 Rash and other nonspecific skin eruption: Secondary | ICD-10-CM | POA: Diagnosis not present

## 2017-01-03 DIAGNOSIS — R338 Other retention of urine: Secondary | ICD-10-CM | POA: Diagnosis not present

## 2017-01-03 DIAGNOSIS — R339 Retention of urine, unspecified: Secondary | ICD-10-CM | POA: Diagnosis not present

## 2017-01-03 DIAGNOSIS — N3001 Acute cystitis with hematuria: Secondary | ICD-10-CM | POA: Diagnosis not present

## 2017-01-03 DIAGNOSIS — N410 Acute prostatitis: Secondary | ICD-10-CM | POA: Diagnosis not present

## 2017-01-03 DIAGNOSIS — N401 Enlarged prostate with lower urinary tract symptoms: Secondary | ICD-10-CM | POA: Diagnosis not present

## 2017-01-03 DIAGNOSIS — R3912 Poor urinary stream: Secondary | ICD-10-CM | POA: Diagnosis not present

## 2017-01-05 DIAGNOSIS — R3982 Chronic bladder pain: Secondary | ICD-10-CM | POA: Diagnosis not present

## 2017-01-10 DIAGNOSIS — N410 Acute prostatitis: Secondary | ICD-10-CM | POA: Diagnosis not present

## 2017-01-10 DIAGNOSIS — R3982 Chronic bladder pain: Secondary | ICD-10-CM | POA: Diagnosis not present

## 2017-01-10 DIAGNOSIS — R338 Other retention of urine: Secondary | ICD-10-CM | POA: Diagnosis not present

## 2017-01-24 DIAGNOSIS — R338 Other retention of urine: Secondary | ICD-10-CM | POA: Diagnosis not present

## 2017-01-25 ENCOUNTER — Encounter (HOSPITAL_COMMUNITY): Payer: Medicare Other

## 2017-01-25 ENCOUNTER — Encounter: Payer: Self-pay | Admitting: Emergency Medicine

## 2017-01-25 DIAGNOSIS — Z7982 Long term (current) use of aspirin: Secondary | ICD-10-CM | POA: Diagnosis not present

## 2017-01-25 DIAGNOSIS — R339 Retention of urine, unspecified: Secondary | ICD-10-CM | POA: Diagnosis not present

## 2017-01-25 DIAGNOSIS — Z87891 Personal history of nicotine dependence: Secondary | ICD-10-CM | POA: Diagnosis not present

## 2017-01-25 DIAGNOSIS — Z87438 Personal history of other diseases of male genital organs: Secondary | ICD-10-CM | POA: Insufficient documentation

## 2017-01-25 DIAGNOSIS — I10 Essential (primary) hypertension: Secondary | ICD-10-CM | POA: Insufficient documentation

## 2017-01-25 DIAGNOSIS — Z79899 Other long term (current) drug therapy: Secondary | ICD-10-CM | POA: Insufficient documentation

## 2017-01-25 DIAGNOSIS — R338 Other retention of urine: Secondary | ICD-10-CM | POA: Diagnosis not present

## 2017-01-25 LAB — URINALYSIS, COMPLETE (UACMP) WITH MICROSCOPIC
Bilirubin Urine: NEGATIVE
Glucose, UA: NEGATIVE mg/dL
Ketones, ur: NEGATIVE mg/dL
Nitrite: POSITIVE — AB
Protein, ur: NEGATIVE mg/dL
Specific Gravity, Urine: 1.011 (ref 1.005–1.030)
Squamous Epithelial / LPF: NONE SEEN
pH: 6 (ref 5.0–8.0)

## 2017-01-25 NOTE — ED Triage Notes (Signed)
Patient states that he had a foley catheter removed this morning. Patient states that he was able to void some this afternoon but has not been able to void any since.

## 2017-01-25 NOTE — ED Triage Notes (Signed)
Bladder scan 317 ml 

## 2017-01-26 ENCOUNTER — Emergency Department
Admission: EM | Admit: 2017-01-26 | Discharge: 2017-01-26 | Disposition: A | Discharge status: Home / Self Care | Payer: Medicare Other | Attending: Student in an Organized Health Care Education/Training Program | Admitting: Student in an Organized Health Care Education/Training Program

## 2017-01-26 DIAGNOSIS — R339 Retention of urine, unspecified: Secondary | ICD-10-CM

## 2017-01-26 MED ORDER — LEVOFLOXACIN 500 MG PO TABS: 500.0000 mg | ORAL_TABLET | Freq: Every day | ORAL | 0 refills | Status: AC

## 2017-01-26 NOTE — ED Provider Notes (Signed)
[facility] Emergency Department Provider Note    First MD Initiated Contact with Patient 01/26/17 0115     (approximate)  I have reviewed the triage vital signs and the nursing notes.   HISTORY  Chief Complaint Urinary Retention    HPI Brian Horn is a 81 y.o. male history of enlarged prostate presents with urinary retention today after having a Foley catheter removed this morning. Patient was having significant discomfort and inability to empty his bladder therefore told catheter was inserted after evaluation in triage. Patient feels much improved. Denies any fevers. No nausea or vomiting. Not currently on any antibiotics. He did just recently start Levitra. Is not on a blood thinners.   Past Medical History:  Diagnosis Date  . Blood dyscrasia    hemachromatosis  . ED (erectile dysfunction)   . Glaucoma   . Hematuria, gross   . Hyperlipidemia   . Hypertension   . Inguinal hernia   . Nephrolithiasis   . Paroxysmal atrial fibrillation (HCC)   . Peyronie's disease   . Pulmonic stenosis    mild  . Renal cyst, left   . Tinnitus    Family History  Problem Relation Age of Onset  . Heart disease Mother   . Heart disease Father    Past Surgical History:  Procedure Laterality Date  . APPENDECTOMY    . CHOLECYSTECTOMY    . EYE SURGERY     Patient Active Problem List   Diagnosis Date Noted  . Inguinal hernia, left. 07/20/2011      Prior to Admission medications   Medication Sig Start Date End Date Taking? Authorizing Provider  amLODipine-benazepril (LOTREL) 10-20 MG per capsule Take 1 capsule by mouth daily.    [provider]  Aspirin 81 MG EC tablet Take 81 mg by mouth daily.    [provider]  Calcium-Vitamin D-Vitamin K (CHEWABLE CALCIUM PO) Take 1 tablet by mouth daily.    [provider]  Cinnamon 500 MG TABS Take 1 tablet by mouth daily.    [provider]  Coenzyme Q10 (CO Q-10) 100 MG CAPS  Take 2 tablets by mouth daily. chewable    [provider]  lansoprazole (PREVACID) 15 MG capsule Take 15 mg by mouth daily before breakfast.    [provider]  latanoprost (XALATAN) 0.005 % ophthalmic solution 1 drop at bedtime.    [provider]  levofloxacin (LEVAQUIN) 500 MG tablet Take 1 tablet (500 mg total) by mouth daily. 01/26/17 02/05/17  Merlyn Lot, MD  Multiple Vitamins-Minerals (MULTIVITAMIN PO) Take by mouth as directed.    [provider]  simvastatin (ZOCOR) 40 MG tablet Take 40 mg by mouth every evening.    [provider]  vardenafil (LEVITRA) 20 MG tablet Take 20 mg by mouth daily as needed.    [provider]    Allergies Lipitor [atorvastatin calcium]    Social History Social History  Substance Use Topics  . Smoking status: Former Smoker    Quit date: 07/21/1971  . Smokeless tobacco: Never Used  . Alcohol use Yes     Comment: occ    Review of Systems Patient denies headaches, rhinorrhea, blurry vision, numbness, shortness of breath, chest pain, edema, cough, abdominal pain, nausea, vomiting, diarrhea, dysuria, fevers, rashes or hallucinations unless otherwise stated above in HPI. ____________________________________________   PHYSICAL EXAM:  VITAL SIGNS: Vitals:   01/25/17 2228  BP: (!) 141/93  Pulse: (!) 56  Resp: 18  Temp: (!) 97.5 F (36.4 C)  SpO2: 96%    Constitutional: Alert and oriented. Well appearing and in no acute distress. Eyes: Conjunctivae are normal.  Head: Atraumatic. Nose: No congestion/rhinnorhea. Mouth/Throat: Mucous membranes are moist.   Neck: No stridor. Painless ROM.  Cardiovascular: Normal rate, regular rhythm. Grossly normal heart sounds.  Good peripheral circulation. Respiratory: Normal respiratory effort.  No retractions.  Gastrointestinal: Soft and nontender. No distention. No abdominal bruits. No CVA tenderness. Musculoskeletal: No lower extremity  tenderness nor edema.  No joint effusions. Neurologic:  Normal speech and language. No gross focal neurologic deficits are appreciated. No facial droop Skin:  Skin is warm, dry and intact. No rash noted. Psychiatric: Mood and affect are normal. Speech and behavior are normal.  ____________________________________________   LABS (all labs ordered are listed, but only abnormal results are displayed)  Results for orders placed or performed during the hospital encounter of 01/26/17 (from the past 24 hour(s))  Urinalysis, Complete w Microscopic     Status: Abnormal   Collection Time: 01/25/17 11:02 PM  Result Value Ref Range   Color, Urine YELLOW (A) YELLOW   APPearance CLEAR (A) CLEAR   Specific Gravity, Urine 1.011 1.005 - 1.030   pH 6.0 5.0 - 8.0   Glucose, UA NEGATIVE NEGATIVE mg/dL   Hgb urine dipstick MODERATE (A) NEGATIVE   Bilirubin Urine NEGATIVE NEGATIVE   Ketones, ur NEGATIVE NEGATIVE mg/dL   Protein, ur NEGATIVE NEGATIVE mg/dL   Nitrite POSITIVE (A) NEGATIVE   Leukocytes, UA LARGE (A) NEGATIVE   RBC / HPF TOO NUMEROUS TO COUNT 0 - 5 RBC/hpf   WBC, UA TOO NUMEROUS TO COUNT 0 - 5 WBC/hpf   Bacteria, UA RARE (A) NONE SEEN   Squamous Epithelial / LPF NONE SEEN NONE SEEN   WBC Clumps PRESENT    ____________________________________________  EKG____________________________________________  RADIOLOGY  ____________________________________________   PROCEDURES  Procedure(s) performed:  Procedures    Critical Care performed: no ____________________________________________   INITIAL IMPRESSION / ASSESSMENT AND PLAN / ED COURSE  Pertinent labs & imaging results that were available during my care of the patient were reviewed by me and considered in my medical decision making (see chart for details).  DDX: bladder obstruction, uti, prostatitis, urethral spasm  Brian Horn is a 81 y.o. who presents to the ED with acute urinary retention as described above now much  better after Foley catheter insertion. Denies any fevers or signs or symptoms of UTI just recently completed antibiotics. Urinalysis showed nitrate positive and leukocyte positive but with rare bacteria. I will send for culture. These findings may be secondary to recent trauma from in and out catheterization but I will go ahead and order prescription for Levaquin to cover for prostatitis as well. Patient will follow up with Dr. Junious Silk in urology clinic. He denies any other symptoms or discomfort at this time.      ____________________________________________   FINAL CLINICAL IMPRESSION(S) / ED DIAGNOSES  Final diagnoses:  Urinary retention      NEW MEDICATIONS STARTED DURING THIS VISIT:  New Prescriptions   LEVOFLOXACIN (LEVAQUIN) 500 MG TABLET    Take 1 tablet (500 mg total) by mouth daily.     Note:  This document was prepared using Dragon voice recognition software and may include unintentional dictation errors.    Merlyn Lot, MD 01/26/17 [phone]

## 2017-01-26 NOTE — ED Notes (Signed)

## 2017-01-28 LAB — URINE CULTURE: Culture: >=100000 — AB

## 2017-02-03 DIAGNOSIS — D72829 Elevated white blood cell count, unspecified: Secondary | ICD-10-CM | POA: Diagnosis not present

## 2017-02-03 DIAGNOSIS — K219 Gastro-esophageal reflux disease without esophagitis: Secondary | ICD-10-CM | POA: Diagnosis not present

## 2017-02-03 DIAGNOSIS — Z23 Encounter for immunization: Secondary | ICD-10-CM | POA: Diagnosis not present

## 2017-02-03 DIAGNOSIS — N401 Enlarged prostate with lower urinary tract symptoms: Secondary | ICD-10-CM | POA: Diagnosis not present

## 2017-02-03 DIAGNOSIS — I480 Paroxysmal atrial fibrillation: Secondary | ICD-10-CM | POA: Diagnosis not present

## 2017-02-03 DIAGNOSIS — I10 Essential (primary) hypertension: Secondary | ICD-10-CM | POA: Diagnosis not present

## 2017-02-08 DIAGNOSIS — I480 Paroxysmal atrial fibrillation: Secondary | ICD-10-CM | POA: Diagnosis not present

## 2017-02-08 DIAGNOSIS — R790 Abnormal level of blood mineral: Secondary | ICD-10-CM | POA: Diagnosis not present

## 2017-02-11 ENCOUNTER — Telehealth: Payer: Self-pay | Admitting: *Deleted

## 2017-02-11 NOTE — Telephone Encounter (Signed)
NOTES SENT TO SCHEDULING.  °

## 2017-02-16 DIAGNOSIS — R338 Other retention of urine: Secondary | ICD-10-CM | POA: Diagnosis not present

## 2017-02-16 DIAGNOSIS — N401 Enlarged prostate with lower urinary tract symptoms: Secondary | ICD-10-CM | POA: Diagnosis not present

## 2017-02-16 DIAGNOSIS — N5201 Erectile dysfunction due to arterial insufficiency: Secondary | ICD-10-CM | POA: Diagnosis not present

## 2017-02-16 DIAGNOSIS — R310 Gross hematuria: Secondary | ICD-10-CM | POA: Diagnosis not present

## 2017-02-21 ENCOUNTER — Ambulatory Visit (HOSPITAL_COMMUNITY): Payer: Medicare Other

## 2017-02-22 ENCOUNTER — Other Ambulatory Visit: Payer: Self-pay | Admitting: Urology

## 2017-02-22 ENCOUNTER — Telehealth: Payer: Self-pay

## 2017-02-22 NOTE — Telephone Encounter (Signed)
   [facility] Pre-operative Risk Assessment    Request for surgical clearance:  1. What type of surgery is being performed? urolift   2. When is this surgery scheduled? 03/15/2017  3. Are there any medications that need to be held prior to surgery and how long? Please advise regarding discontinuation of Aspirin or any blood thinners prior to surgery. We request an average of 5 days discontinuation of for aspirin prior to surgery. Discontinuation time for prescription blood thinners. Please list any precautions you feel necessary before, during or after surgery.  4. Practice name and name of physician performing surgery? [facility]. Dr. Leonidas Romberg   5. What is your office phone and fax number? Phone: [phone] Fax: [phone]  6. Anesthesia type (None, local, MAC, general) ? Not specified.   Brian Horn 02/22/2017, 4:00 PM  _________________________________________________________________   (provider comments below)

## 2017-02-23 DIAGNOSIS — H401133 Primary open-angle glaucoma, bilateral, severe stage: Secondary | ICD-10-CM | POA: Diagnosis not present

## 2017-02-23 NOTE — Telephone Encounter (Signed)
Pt has appt 02/24/17 for pre-op clearance, I do not see any records for this pt.

## 2017-02-24 ENCOUNTER — Ambulatory Visit (INDEPENDENT_AMBULATORY_CARE_PROVIDER_SITE_OTHER): Payer: Medicare Other | Admitting: Cardiology

## 2017-02-24 ENCOUNTER — Encounter: Payer: Self-pay | Admitting: Cardiology

## 2017-02-24 VITALS — BP 122/80 | HR 90 | Ht 72.0 in | Wt 175.6 lb

## 2017-02-24 DIAGNOSIS — I481 Persistent atrial fibrillation: Secondary | ICD-10-CM | POA: Diagnosis not present

## 2017-02-24 DIAGNOSIS — E785 Hyperlipidemia, unspecified: Secondary | ICD-10-CM | POA: Diagnosis not present

## 2017-02-24 DIAGNOSIS — I4819 Other persistent atrial fibrillation: Secondary | ICD-10-CM

## 2017-02-24 DIAGNOSIS — I10 Essential (primary) hypertension: Secondary | ICD-10-CM

## 2017-02-24 NOTE — Progress Notes (Signed)
Electrophysiology Office Note   Date:  02/24/2017   ID:  Brian Horn, DOB 10-16-1935, MRN [id number]  PCP:  [name], MD  Cardiologist:   Primary Electrophysiologist:  Caroll Cunnington Meredith [name], MD    Chief Complaint  Patient presents with  . Advice Only    Afib     History of Present Illness: Brian Horn is a 81 y.o. male who is being seen today for the evaluation of atrial fibrillation at the request of [name], MD. Presenting today for electrophysiology evaluation.  He has a history of persistent atrial fibrillation, hypertension, hyperlipidemia.  He presents today for workup of his atrial fibrillation and as a preop visit.  He is currently feeling well.  When he was initially diagnosed with atrial fibrillation, he had fatigue weakness and shortness of breath.  Currently, he says that he is asymptomatic.  Today, he denies symptoms of palpitations, chest pain, shortness of breath, orthopnea, PND, lower extremity edema, claudication, dizziness, presyncope, syncope, bleeding, or neurologic sequela. The patient is tolerating medications without difficulties.    Past Medical History:  Diagnosis Date  . Blood dyscrasia    hemachromatosis  . ED (erectile dysfunction)   . Glaucoma   . Hematuria, gross   . Hyperlipidemia   . Hypertension   . Inguinal hernia   . Nephrolithiasis   . Paroxysmal atrial fibrillation (HCC)   . Peyronie's disease   . Pulmonic stenosis    mild  . Renal cyst, left   . Tinnitus    Past Surgical History:  Procedure Laterality Date  . APPENDECTOMY    . CHOLECYSTECTOMY    . EYE SURGERY       Current Outpatient Prescriptions  Medication Sig Dispense Refill  . apixaban (ELIQUIS) 5 MG TABS tablet Take 5 mg by mouth 2 (two) times daily.    . benazepril (LOTENSIN) 20 MG tablet Take 20 mg by mouth daily.    . Cholecalciferol (VITAMIN D3) 1000 units CAPS Take 1 capsule by mouth daily.    . Cinnamon 500 MG TABS Take 1 tablet by mouth daily.     [name] DILT-XR 180 MG 24 hr capsule Take 180 mg by mouth daily.    . finasteride (PROSCAR) 5 MG tablet Take 5 mg by mouth daily.    . lansoprazole (PREVACID) 15 MG capsule Take 15 mg by mouth daily before breakfast.    . latanoprost (XALATAN) 0.005 % ophthalmic solution 1 drop at bedtime.    . Multiple Vitamins-Minerals (MULTIVITAMIN PO) Take by mouth as directed.    . simvastatin (ZOCOR) 20 MG tablet Take 20 mg by mouth daily.    . tamsulosin (FLOMAX) 0.4 MG CAPS capsule Take 0.4 mg by mouth daily.     No current facility-administered medications for this visit.     Allergies:   Lipitor [atorvastatin calcium]   Social History:  The patient  reports that he quit smoking about 45 years ago. He has never used smokeless tobacco. He reports that he drinks alcohol. He reports that he does not use drugs.   Family History:  The patient's family history includes Heart disease in his father and mother.    ROS:  Please see the history of present illness.   Otherwise, review of systems is positive for palpitations.   All other systems are reviewed and negative.    PHYSICAL EXAM: VS:  BP 122/80   Pulse 90   Ht 6' (1.829 m)   Wt 175 lb 9.6 oz (79.7  kg)   BMI 23.82 kg/m  , BMI Body mass index is 23.82 kg/m. GEN: Well nourished, well developed, in no acute distress  HEENT: normal  Neck: no JVD, carotid bruits, or masses Cardiac: iRRR; no murmurs, rubs, or gallops,no edema  Respiratory:  clear to auscultation bilaterally, normal work of breathing GI: soft, nontender, nondistended, + BS MS: no deformity or atrophy  Skin: warm and dry Neuro:  Strength and sensation are intact Psych: euthymic mood, full affect  EKG:  EKG is ordered today. Personal review of the ekg ordered shows atrial fibrillation, rate 90  Recent Labs: No results found for requested labs within last 8760 hours.    Lipid Panel  No results found for: CHOL, TRIG, HDL, CHOLHDL, VLDL, LDLCALC, LDLDIRECT   Wt Readings from  Last 3 Encounters:  02/24/17 175 lb 9.6 oz (79.7 kg)  01/25/17 175 lb (79.4 kg)  12/18/15 179 lb 9.6 oz (81.5 kg)      Other studies Reviewed: Additional studies/ records that were reviewed today include: TTE 08/27/15  Review of the above records today demonstrates:  - Left ventricle: The cavity size was normal. Wall thickness was   increased in a pattern of mild LVH. Systolic function was   vigorous. The estimated ejection fraction was in the range of 65%   to 70%. Wall motion was normal; there were no regional wall   motion abnormalities. Doppler parameters are consistent with   abnormal left ventricular relaxation (grade 1 diastolic   dysfunction). The E/e&' ratio is <8, suggesting normal LV filling   pressure. - Aortic valve: Sclerosis without stenosis. Transvalvular velocity   was minimally increased. Mean gradient (S): 8 mm Hg. Peak   gradient (S): 17 mm Hg. - Left atrium: The atrium was normal in size. - Atrial septum: No defect or patent foramen ovale was identified. - Tricuspid valve: There was trivial regurgitation. - Pulmonic valve: Poorly visualized. Mild pulmonic stenosis. Peak   gradient of 32 mmHg. - Pulmonary arteries: Poorly visualized. PA peak pressure: 11 mm Hg   (S). - Inferior vena cava: The vessel was normal in size. The   respirophasic diameter changes were in the normal range (= 50%),   consistent with normal central venous pressure.   ASSESSMENT AND PLAN:  1.  Persistent atrial fibrillation: Currently on Eliquis.  He is rate controlled on 180 of diltiazem currently.  He was previously having symptoms of fatigue and weakness, but those have improved.  It is unclear if he is simply gotten used to those symptoms.  I discussed with him the possibility of rhythm control as opposed to rate control.  He is planning to have a urologic surgery on November 20.  We Martie Muhlbauer plan to wait until after the surgery to discuss further rhythm control.  This patients CHA2DS2-VASc  Score and unadjusted Ischemic Stroke Rate (% per year) is equal to 3.2 % stroke rate/year from a score of 3  Above score calculated as 1 point each if present [CHF, HTN, DM, Vascular=MI/PAD/Aortic Plaque, Age if 65-74, or Male] Above score calculated as 2 points each if present [Age > 75, or Stroke/TIA/TE]  2.  Hypertension: Well-controlled on amlodipine and benazepril  3.  Hyperlipidemia: Continue simvastatin  4.  Preop evaluation: Currently not having chest pain or shortness of breath.  He is in atrial fibrillation.  He had an echo in 2017 that showed no major abnormalities.  Due to that, he would be at intermediate risk for intermediate risk procedure.  Would  hold Eliquis for 2 days prior to surgery. Would restart his Eliquis as soon as possible postop.  Current medicines are reviewed at length with the patient today.   The patient does not have concerns regarding his medicines.  The following changes were made today:  none  Labs/ tests ordered today include:  Orders Placed This Encounter  Procedures  . EKG 12-Lead     Disposition:   FU with Yanai Hobson 6 weeks  Signed, Kiyona Mcnall Meredith [name], MD  02/24/2017 2:32 PM     [facility] [address] [place] [address] [phone] (office) [phone] (fax)

## 2017-02-24 NOTE — Telephone Encounter (Signed)
See below. You are seeing today for pre-op clearance.  This request will be cleared out of pre-op box (never seen in our clinic before), so please forward your clearance decision to requesting MD. Thanks. Jameal Razzano PA-C

## 2017-02-24 NOTE — Patient Instructions (Signed)
Medication Instructions:  Your physician recommends that you continue on your current medications as directed. Please refer to the Current Medication list given to you today.  -- If you need a refill on your cardiac medications before your next appointment, please call your pharmacy. --  Labwork: None ordered  Testing/Procedures: None ordered  Follow-Up: Your physician recommends that you schedule a follow-up appointment in: 6 weeks with Dr. Curt Bears.  Thank you for choosing [facility]!!   Trinidad Curet, RN [phone]

## 2017-03-10 ENCOUNTER — Encounter (HOSPITAL_BASED_OUTPATIENT_CLINIC_OR_DEPARTMENT_OTHER): Payer: Self-pay | Admitting: *Deleted

## 2017-03-10 ENCOUNTER — Other Ambulatory Visit: Payer: Self-pay

## 2017-03-10 NOTE — Progress Notes (Addendum)
Npo after midnight take diltiazem xr and lansoprazole sip of water in am, patient instructed per dr Curt Bears to stop eliquis 2 days prior to surgery. Cardiac clearance note dr Curt Bears 02-24-17 epic and on chart  ekg 02-25-17 epic and on chartecho 08-27-15 epic and on chart, needs I stat 4

## 2017-03-14 NOTE — H&P (Signed)
Office Visit Report     02/16/2017   --------------------------------------------------------------------------------   Brian Horn  MRN: [id number]  PRIMARY CARE:  Delanna Ahmadi, MD  DOB: 1935-11-26, 81 year old Male  REFERRING:  Ardis Hughs, MD  SSN: [id number]  PROVIDER:  [name], M.D.    LOCATION:  [facility] [phone]   --------------------------------------------------------------------------------   CC: I have urinary retention.  HPI: Brian Horn is a 81 year-old male established patient who is here for urinary retention.  His problem was diagnosed 01/03/2017. His current symptoms did not begin after he had a surgical procedure. His urinary retention is being treated with foley catheter. Patient denies suprapubic tube, intemittent catheterization, flomax, hytrin, cardura, uroxatrol, rapaflo, avodart, and proscar.   He does have an abnormal sensation when needing to urinate. He does have to strain or bear down to start his urinary stream.   Sep 2018 pt developed urine odor, urgency, frequency, weak stream. He noted blood in urine. Foley placed today and drained 575 ml. Urine bloody (they tried at PCP and couldn't get a foley in).   He failed a voiding trial 2 and continues to Foley catheter today.     CC: I have symptoms of an enlarged prostate.  HPI: He first noticed the symptoms approximately 12/26/2006. His symptoms have gotten worse over the last year. He has not been treated with medications in the past for his lower urinary tract symptoms. The patient has never had a surgical procedure for bladder outlet obstruction to his prostate.   Prostate was about 60 g on CT in 2007. On surveillance since then. PSA was 4 in Apr 2017.   Prostate was about 80 g on recent imaging. He was started on finasteride and tamsulosin September 2018. This was during an episode of urinary retention.     CC: I have blood in my urine.  HPI: He did see the  blood in his urine. He first noticed the symptoms approximately 12/29/2016. He has seen blood clots.   He does have a burning sensation when he urinates. He is currently having trouble urinating.   He returns for continued management of gross hematuria associated with UTI and retention. Renal ultrasound today showed benign findings. Postvoid is 0 as he has a Foley catheter.   He had some gross hematuria after he walked the other day with the catheter in place.     CC: I am having trouble with my erections.  HPI: He first stated noticing pain on approximately 01/24/1997. His symptoms did begin gradually. His symptoms have been stable over the last year.   He does have difficulties achieving an erection. He does have problems maintaining his erections. He has tried Viagra. It did not work. He has tried penile injection therapy. He used Trimix.[name]   impotence and Peyronie's disease. Tried 10 mcg Prostin which worked well. He started Viagra in 1998.   -Oct 2014 - prostin not as effective, tried Trimix and sildenafil 20 mg.   He needs a refill of tri-mix. It is working well.     ALLERGIES: Lipitor TABS - Other Reaction, muscle pain Pecan Extract - Other Reaction, mouth sores    MEDICATIONS: Finasteride 5 mg tablet 1 tablet PO Daily  Levitra 20 mg tablet  Tamsulosin Hcl 0.4 mg capsule, ext release 24 hr 1 capsule PO Q HS  Aspir 81 81 mg tablet, delayed release  Cinnamon 500 mg capsule  Coenzyme Q-10  Diltiazem 24Hr Cd 180 mg  capsule, ext release 24 hr  Eliquis  Levsin-Sl 0.125 mg tablet, sublingual 1 tablet Sublingual Q 6 H PRN  Multivitamin  Prevacid 15 mg capsule,delayed release  Xalatan 0.005 % drops  Zocor 40 mg tablet     GU PSH: None   NON-GU PSH: Appendectomy - 2008 Cholecystectomy (laparoscopic) Hernia Repair - 2016    GU PMH: Urinary Retention - 01/10/2017, - 01/03/2017 Suprapubic pain (Acute), Ketorolac 30 mg IM today. May continue with PRN Tylenol/NSAID. Instructed to  avoid bladder irritants and push po fluids. May use heat for bladder pain. Instructed to resolve constipation with LOC - 01/05/2017 Acute prostatitis - 01/03/2017 BPH w/LUTS - 01/03/2017, Benign prostatic hyperplasia with urinary obstruction, - 2016 Gross hematuria - 01/03/2017 Weak Urinary Stream - 01/03/2017 Nocturia - 01/01/2016 ED due to arterial insufficiency, Erectile dysfunction due to arterial insufficiency - 2016 Other microscopic hematuria, Microscopic hematuria - 2014 Peyronies Disease, Peyronie's disease - 2014 Renal calculus, Bilateral kidney stones - 2014 Renal cyst, Renal cyst, acquired - 2014 Urinary Tract Inf, Unspec site, Pyuria - 2014      PMH Notes:  2006-06-03 10:18:31 - Note: Hemochromatosis   NON-GU PMH: Encounter for general adult medical examination without abnormal findings, Encounter for preventive health examination - 2014 Atrial Fibrillation Cardiac murmur, unspecified Glaucoma Hypercholesterolemia Hypertension    FAMILY HISTORY: None   SOCIAL HISTORY: Marital Status: Married Preferred Language: English; Ethnicity: Not Hispanic Or Latino; Race: White Current Smoking Status: Patient does not smoke anymore. Has not smoked since 12/25/1965.  Drinks 1 drink per day. Types of alcohol consumed: Wine. Light Drinker.  Drinks 1 caffeinated drink per day.    REVIEW OF SYSTEMS:    GU Review Male:   gross hematuria. Patient denies frequent urination, hard to postpone urination, burning/ pain with urination, get up at night to urinate, leakage of urine, stream starts and stops, trouble starting your stream, have to strain to urinate , erection problems, and penile pain.  Gastrointestinal (Upper):   Patient denies nausea, vomiting, and indigestion/ heartburn.  Gastrointestinal (Lower):   Patient denies diarrhea and constipation.  Constitutional:   Patient denies fatigue, night sweats, weight loss, and fever.  Skin:   Patient denies skin rash/ lesion and itching.  Eyes:    Patient denies blurred vision and double vision.  Ears/ Nose/ Throat:   Patient denies sore throat and sinus problems.  Hematologic/Lymphatic:   Patient denies swollen glands and easy bruising.  Cardiovascular:   Patient denies leg swelling and chest pains.  Respiratory:   Patient denies cough and shortness of breath.  Endocrine:   Patient denies excessive thirst.  Musculoskeletal:   Patient reports back pain. Patient denies joint pain.  Neurological:   Patient denies headaches and dizziness.  Psychologic:   Patient denies depression and anxiety.   VITAL SIGNS:      02/16/2017 10:10 AM  BP 123/80 mmHg  Pulse 118 /min  Temperature 97.9 F / 36.6 C   GU PHYSICAL EXAMINATION:    Scrotum: No lesions. No edema. No cysts. No warts.  Urethral Meatus: Normal size. No lesion, no wart, no discharge, no polyp. Normal location.  Penis: Penis uncircumcised. No foreskin warts, no cracks. No dorsal peyronie's plaques, no left corporal peyronie's plaques, no right corporal peyronie's plaques, no scarring, no shaft warts. No balanitis, no meatal stenosis.    MULTI-SYSTEM PHYSICAL EXAMINATION:    Constitutional: Well-nourished. No physical deformities. Normally developed. Good grooming.  Neck: Neck symmetrical, not swollen. Normal tracheal position.  Respiratory: No labored  breathing, no use of accessory muscles.   Cardiovascular: Normal temperature, normal extremity pulses, no swelling, no varicosities.  Skin: No paleness, no jaundice, no cyanosis. No lesion, no ulcer, no rash.  Neurologic / Psychiatric: Oriented to time, oriented to place, oriented to person. No depression, no anxiety, no agitation.  Gastrointestinal: No mass, no tenderness, no rigidity, non obese abdomen.    PAST DATA REVIEWED:  Source Of History:  Patient  X-Ray Review: C.T. Abdomen/Pelvis: Reviewed Films. 2007    PROCEDURES:         Flexible Cystoscopy - 52000  Risks, benefits, and some of the potential complications of the  procedure were discussed with the patient. All questions were answered. Informed consent was obtained. Antibiotic prophylaxis was given -- Cephalexin. Sterile technique and intraurethral analgesia were used.  Meatus:  Normal size. Normal location. Normal condition.  Urethra:  No strictures.  External Sphincter:  Normal.  Verumontanum:  Normal.  Prostate:  Borderline obstructing. Moderate hyperplasia - lateral lobes.   Bladder Neck:  Non-obstructing.  Ureteral Orifices:  Normal location. Normal size. Normal shape. Effluxed clear urine.  Bladder:  Moderate trabeculation. Erythematous mucosa - posteriorly. No tumors. No stones.     The lower urinary tract was carefully examined. Filled to 300 ml but voided around scope. Voided about 50 ml pink urine. The procedure was well-tolerated and without complications. Antibiotic instructions were given. Instructions were given to call the office immediately for bloody urine, difficulty urinating, painful urination, fever, chills, nausea, vomiting or other illness. The patient stated that he understood these instructions and would comply with them.        Renal Ultrasound - 33295  Right Kidney: Length: 11.2 cm Depth:5.4 cm Cortical Width: 1.3 cm Width:5.9 cm  Left Kidney: Length: 11.5 cm Depth: 5.5 cm Cortical Width: 1.7 cm Width: 5.4 cm  Left Kidney/Ureter:  ? Multiple echogenic foci , largest= 0.27cm. Multiple cystic areas, largest UP= 3.6x3.6x3.8cm  Right Kidney/Ureter:  ? Multiple echogenic foci, largest= 0.30cm MP  Bladder:  Not seen. ( foley)     I reviewed all the images. Comparison made to CT scan from 2007.   ASSESSMENT:      ICD-10 Details  1 GU:   BPH w/LUTS - N40.1   2   Urinary Retention - R33.8   3   Gross hematuria - R31.0   4   ED due to arterial insufficiency - N52.01    PLAN:           Medications Refill Meds: Trimix (PGE 20, PAP 30, PHE 0.5); USE AS DIRECTED use as directed. inject no more than 3 x per week   #10  5  Refill(s)          Schedule Return Visit/Planned Activity: Return PRN - Office Visit, Follow up MD        Document Letter(s):  Created for Patient: Clinical Summary        Notes:   Gross hematuria-benign evaluation. He has some posterior erythema from the catheter being present. No worrisome findings. This area could be biopsied if he goes to the operating room. We discussed the role of UDS.  BPH-he'll continue combination therapy. Prostate with moderate hyperplasia, he would do well with a TURP, thulium or urolift. We discussed with procedures typically flow rate and retention resolve with relatively good success. We discussed risks of bleeding, infection, iRGE, ncontinence and stricture among others. Also, discussed UDS. Also failure to reach treatment goal and continued retention.  Urinary retention-void trial  today. We discussed other options such as learning CIC.  cc: Dr. Laurann Montana    * Signed by [name], M.D. on 02/16/17 at 4:57 PM (EDT)*    The information contained in this medical record document is considered private and confidential patient information. This information can only be used for the medical diagnosis and/or medical services that are being provided by the patient's selected caregivers. This information can only be distributed outside of the patient's care if the patient agrees and signs waivers of authorization for this information to be sent to an outside source or route.

## 2017-03-15 ENCOUNTER — Encounter (HOSPITAL_BASED_OUTPATIENT_CLINIC_OR_DEPARTMENT_OTHER): Payer: Self-pay | Admitting: *Deleted

## 2017-03-15 ENCOUNTER — Ambulatory Visit (HOSPITAL_BASED_OUTPATIENT_CLINIC_OR_DEPARTMENT_OTHER)
Admission: RE | Admit: 2017-03-15 | Discharge: 2017-03-15 | Disposition: A | Discharge status: Home / Self Care | Payer: Medicare Other | Source: Ambulatory Visit | Attending: Urology | Admitting: Urology

## 2017-03-15 ENCOUNTER — Ambulatory Visit (HOSPITAL_BASED_OUTPATIENT_CLINIC_OR_DEPARTMENT_OTHER): Payer: Medicare Other | Admitting: Anesthesiology

## 2017-03-15 ENCOUNTER — Encounter (HOSPITAL_BASED_OUTPATIENT_CLINIC_OR_DEPARTMENT_OTHER)
Admission: RE | Disposition: A | Discharge status: Home / Self Care | Payer: Self-pay | Source: Ambulatory Visit | Attending: Urology

## 2017-03-15 ENCOUNTER — Other Ambulatory Visit: Payer: Self-pay

## 2017-03-15 DIAGNOSIS — Z79899 Other long term (current) drug therapy: Secondary | ICD-10-CM | POA: Insufficient documentation

## 2017-03-15 DIAGNOSIS — I10 Essential (primary) hypertension: Secondary | ICD-10-CM | POA: Insufficient documentation

## 2017-03-15 DIAGNOSIS — N529 Male erectile dysfunction, unspecified: Secondary | ICD-10-CM | POA: Insufficient documentation

## 2017-03-15 DIAGNOSIS — K219 Gastro-esophageal reflux disease without esophagitis: Secondary | ICD-10-CM | POA: Insufficient documentation

## 2017-03-15 DIAGNOSIS — I4891 Unspecified atrial fibrillation: Secondary | ICD-10-CM | POA: Diagnosis not present

## 2017-03-15 DIAGNOSIS — N401 Enlarged prostate with lower urinary tract symptoms: Secondary | ICD-10-CM | POA: Insufficient documentation

## 2017-03-15 DIAGNOSIS — R310 Gross hematuria: Secondary | ICD-10-CM | POA: Insufficient documentation

## 2017-03-15 DIAGNOSIS — I481 Persistent atrial fibrillation: Secondary | ICD-10-CM | POA: Diagnosis not present

## 2017-03-15 DIAGNOSIS — Z7901 Long term (current) use of anticoagulants: Secondary | ICD-10-CM | POA: Insufficient documentation

## 2017-03-15 DIAGNOSIS — R3912 Poor urinary stream: Secondary | ICD-10-CM | POA: Diagnosis not present

## 2017-03-15 DIAGNOSIS — R338 Other retention of urine: Secondary | ICD-10-CM | POA: Diagnosis not present

## 2017-03-15 DIAGNOSIS — Z87891 Personal history of nicotine dependence: Secondary | ICD-10-CM | POA: Diagnosis not present

## 2017-03-15 DIAGNOSIS — N138 Other obstructive and reflux uropathy: Secondary | ICD-10-CM

## 2017-03-15 HISTORY — DX: Gastro-esophageal reflux disease without esophagitis: K21.9

## 2017-03-15 HISTORY — PX: CYSTOSCOPY WITH INSERTION OF UROLIFT: SHX6678

## 2017-03-15 HISTORY — DX: Personal history of urinary calculi: Z87.442

## 2017-03-15 LAB — POCT I-STAT, CHEM 8
BUN: 13 mg/dL (ref 6–20)
CALCIUM ION: 1.28 mmol/L (ref 1.15–1.40)
CREATININE: 0.9 mg/dL (ref 0.61–1.24)
Chloride: 107 mmol/L (ref 101–111)
Glucose, Bld: 121 mg/dL — ABNORMAL HIGH (ref 65–99)
HEMATOCRIT: 39 % (ref 39.0–52.0)
Hemoglobin: 13.3 g/dL (ref 13.0–17.0)
Potassium: 3.9 mmol/L (ref 3.5–5.1)
Sodium: 143 mmol/L (ref 135–145)
TCO2: 23 mmol/L (ref 22–32)

## 2017-03-15 SURGERY — CYSTOSCOPY WITH INSERTION OF UROLIFT
Anesthesia: General | Site: Prostate

## 2017-03-15 MED ORDER — FENTANYL CITRATE (PF) 100 MCG/2ML IJ SOLN
25.0000 ug | INTRAMUSCULAR | Status: DC | PRN
  Filled 2017-03-15: qty 1

## 2017-03-15 MED ORDER — CEFAZOLIN SODIUM-DEXTROSE 2-4 GM/100ML-% IV SOLN
INTRAVENOUS | Status: AC
  Filled 2017-03-15: qty 100

## 2017-03-15 MED ORDER — EPHEDRINE SULFATE 50 MG/ML IJ SOLN
INTRAMUSCULAR | Status: DC | PRN
  Administered 2017-03-15 (×4): 10 mg via INTRAVENOUS

## 2017-03-15 MED ORDER — PROPOFOL 10 MG/ML IV BOLUS
INTRAVENOUS | Status: DC | PRN
  Administered 2017-03-15: 180 mg via INTRAVENOUS
  Administered 2017-03-15: 20 mg via INTRAVENOUS
  Administered 2017-03-15: 50 mg via INTRAVENOUS

## 2017-03-15 MED ORDER — ONDANSETRON HCL 4 MG/2ML IJ SOLN
INTRAMUSCULAR | Status: DC | PRN
  Administered 2017-03-15: 4 mg via INTRAVENOUS

## 2017-03-15 MED ORDER — ONDANSETRON HCL 4 MG/2ML IJ SOLN
INTRAMUSCULAR | Status: AC
  Filled 2017-03-15: qty 2

## 2017-03-15 MED ORDER — CEPHALEXIN 500 MG PO CAPS: 500.0000 mg | ORAL_CAPSULE | Freq: Two times a day (BID) | ORAL | 0 refills | Status: AC

## 2017-03-15 MED ORDER — LIDOCAINE 2% (20 MG/ML) 5 ML SYRINGE
INTRAMUSCULAR | Status: AC
  Filled 2017-03-15: qty 5

## 2017-03-15 MED ORDER — DEXAMETHASONE SODIUM PHOSPHATE 10 MG/ML IJ SOLN
INTRAMUSCULAR | Status: DC | PRN
  Administered 2017-03-15: 10 mg via INTRAVENOUS

## 2017-03-15 MED ORDER — DEXAMETHASONE SODIUM PHOSPHATE 10 MG/ML IJ SOLN
INTRAMUSCULAR | Status: AC
  Filled 2017-03-15: qty 1

## 2017-03-15 MED ORDER — CEFAZOLIN SODIUM-DEXTROSE 2-4 GM/100ML-% IV SOLN
2.0000 g | Freq: Once | INTRAVENOUS | Status: AC
  Administered 2017-03-15: 2 g via INTRAVENOUS
  Filled 2017-03-15: qty 100

## 2017-03-15 MED ORDER — PROPOFOL 10 MG/ML IV BOLUS
INTRAVENOUS | Status: AC
  Filled 2017-03-15: qty 20

## 2017-03-15 MED ORDER — STERILE WATER FOR IRRIGATION IR SOLN
Status: DC | PRN
  Administered 2017-03-15: 6000 mL

## 2017-03-15 MED ORDER — FENTANYL CITRATE (PF) 100 MCG/2ML IJ SOLN
INTRAMUSCULAR | Status: DC | PRN
  Administered 2017-03-15 (×2): 50 ug via INTRAVENOUS

## 2017-03-15 MED ORDER — CEPHALEXIN 500 MG PO CAPS: 500.0000 mg | ORAL_CAPSULE | Freq: Two times a day (BID) | ORAL | 0 refills | Status: DC

## 2017-03-15 MED ORDER — LIDOCAINE 2% (20 MG/ML) 5 ML SYRINGE
INTRAMUSCULAR | Status: DC | PRN
  Administered 2017-03-15: 100 mg via INTRAVENOUS

## 2017-03-15 MED ORDER — LACTATED RINGERS IV SOLN
INTRAVENOUS | Status: DC
  Administered 2017-03-15: 08:00:00 via INTRAVENOUS
  Filled 2017-03-15: qty 1000

## 2017-03-15 MED ORDER — FENTANYL CITRATE (PF) 100 MCG/2ML IJ SOLN
INTRAMUSCULAR | Status: AC
  Filled 2017-03-15: qty 4

## 2017-03-15 MED ORDER — EPHEDRINE 5 MG/ML INJ
INTRAVENOUS | Status: AC
  Filled 2017-03-15: qty 10

## 2017-03-15 SURGICAL SUPPLY — 29 items
BAG DRAIN URO-CYSTO SKYTR STRL (DRAIN) ×2 IMPLANT
BAG DRN ANRFLXCHMBR STRAP LEK (BAG)
BAG DRN UROCATH (DRAIN) ×1
BAG URINE DRAINAGE (UROLOGICAL SUPPLIES) IMPLANT
BAG URINE LEG 19OZ MD ST LTX (BAG) IMPLANT
BAG URINE LEG 500ML (DRAIN) IMPLANT
CATH COUDE FOLEY 2W 5CC 18FR (CATHETERS) ×1 IMPLANT
CATH FOLEY 2WAY SLVR  5CC 16FR (CATHETERS)
CATH FOLEY 2WAY SLVR 5CC 16FR (CATHETERS) IMPLANT
CLOTH BEACON ORANGE TIMEOUT ST (SAFETY) ×2 IMPLANT
ELECT REM PT RETURN 9FT ADLT (ELECTROSURGICAL)
ELECTRODE REM PT RTRN 9FT ADLT (ELECTROSURGICAL) ×1 IMPLANT
GLOVE BIO SURGEON STRL SZ 6.5 (GLOVE) ×1 IMPLANT
GLOVE BIO SURGEON STRL SZ7.5 (GLOVE) ×2 IMPLANT
GLOVE BIOGEL PI IND STRL 6.5 (GLOVE) IMPLANT
GLOVE BIOGEL PI IND STRL 7.5 (GLOVE) IMPLANT
GLOVE BIOGEL PI INDICATOR 6.5 (GLOVE) ×1
GLOVE BIOGEL PI INDICATOR 7.5 (GLOVE) ×1
GOWN STRL REUS W/ TWL XL LVL3 (GOWN DISPOSABLE) ×1 IMPLANT
GOWN STRL REUS W/TWL LRG LVL3 (GOWN DISPOSABLE) ×2 IMPLANT
GOWN STRL REUS W/TWL XL LVL3 (GOWN DISPOSABLE) ×2
KIT RM TURNOVER CYSTO AR (KITS) ×2 IMPLANT
MANIFOLD NEPTUNE II (INSTRUMENTS) IMPLANT
NEEDLE HYPO 22GX1.5 SAFETY (NEEDLE) IMPLANT
NS IRRIG 500ML POUR BTL (IV SOLUTION) IMPLANT
PACK CYSTO (CUSTOM PROCEDURE TRAY) ×2 IMPLANT
SYSTEM UROLIFT (Male Continence) ×7 IMPLANT
TUBE CONNECTING 12X1/4 (SUCTIONS) ×1 IMPLANT
WATER STERILE IRR 3000ML UROMA (IV SOLUTION) ×3 IMPLANT

## 2017-03-15 NOTE — Discharge Instructions (Signed)
REMOVAL OF THE CATHETER: Remove the Foley catheter as instructed Wednesday morning March 16, 2017.    Prostatic Urethral Lift, Care After This sheet gives you information about how to care for yourself after your procedure. Your health care provider may also give you more specific instructions. If you have problems or questions, contact your health care provider. What can I expect after the procedure? After the procedure, it is common to have:  Discomfort or burning when urinating.  An increased urge to urinate.  More frequent urination.  Urine that is slightly blood-tinged.  These symptoms should go away after a few days. Follow these instructions at home:  Take over-the-counter and prescription medicines only as told by your health care provider.  Do not drive for 24 hours if you were given a medicine to help you relax (sedative).  Do not drive or use heavy machinery while taking prescription pain medicine.  Do not lift anything that is heavier than 10 lb (4.5 kg) until your health care provider says that this is safe.  Return to your normal activities as told by your health care provider. Ask your health care provider what activities are safe for you. Ask when you can return to sexual activity.  Drink enough fluid to keep your urine clear or pale yellow.  Keep all follow-up visits as told by your health care provider. This is important. Contact a health care provider if:  You have chills or a fever.  You have pain when passing urine.  You have bright red blood or blood clots in your urine.  You have difficulty passing urine.  You have leaking of urine (incontinence). Get help right away if:  You have chest pain or shortness of breath.  You have leg pain or swelling.  You cannot pass urine. Summary  After the procedure, it is common to have discomfort or burning when urinating, an increased urge to urinate, more frequent urination, and urine that is slightly  blood-tinged.  Do not drive for 24 hours if you were given a medicine to help you relax (sedative). Do not drive or use heavy machinery while taking prescription pain medicine.  Do not lift anything that is heavier than 10 lb (4.5 kg) until your health care provider says that this is safe.  Return to your normal activities as told by your health care provider. This information is not intended to replace advice given to you by your health care provider. Make sure you discuss any questions you have with your health care provider. Document Released: 06/01/2016 Document Revised: 06/01/2016 Document Reviewed: 06/01/2016 Elsevier Interactive Patient Education  2018 [place], Adult Take good care of your catheter to keep it working and to prevent problems. How to wear your catheter Attach your catheter to your leg with tape (adhesive tape) or a leg strap. Make sure it is not too tight. If you use tape, remove any bits of tape that are already on the catheter. How to wear a drainage bag You should have:  A large overnight bag.  A small leg bag.  Overnight Bag You may wear the overnight bag at any time. Always keep the bag below the level of your bladder but off the floor. When you sleep, put a clean plastic bag in a wastebasket. Then hang the bag inside the wastebasket. Leg Bag Never wear the leg bag at night. Always wear the leg bag below your knee. Keep the leg bag secure with a leg  strap or tape. How to care for your skin  Clean the skin around the catheter at least once every day.  Shower every day. Do not take baths.  Put creams, lotions, or ointments on your genital area only as told by your doctor.  Do not use powders, sprays, or lotions on your genital area. How to clean your catheter and your skin 1. Wash your hands with soap and water. 2. Wet a washcloth in warm water and gentle (mild) soap. 3. Use the washcloth to clean the skin where  the catheter enters your body. Clean downward and wipe away from the catheter in small circles. Do not wipe toward the catheter. 4. Pat the area dry with a clean towel. Make sure to clean off all soap. How to care for your drainage bags Empty your drainage bag when it is ?- full or at least 2-3 times a day. Replace your drainage bag once a month or sooner if it starts to smell bad or look dirty. Do not clean your drainage bag unless told by your doctor. Emptying a drainage bag  Supplies Needed  Rubbing alcohol.  Gauze pad or cotton ball.  Tape or a leg strap.  Steps 1. Wash your hands with soap and water. 2. Separate (detach) the bag from your leg. 3. Hold the bag over the toilet or a clean container. Keep the bag below your hips and bladder. This stops pee (urine) from going back into the tube. 4. Open the pour spout at the bottom of the bag. 5. Empty the pee into the toilet or container. Do not let the pour spout touch any surface. 6. Put rubbing alcohol on a gauze pad or cotton ball. 7. Use the gauze pad or cotton ball to clean the pour spout. 8. Close the pour spout. 9. Attach the bag to your leg with tape or a leg strap. 10. Wash your hands.  Changing a drainage bag Supplies Needed  Alcohol wipes.  A clean drainage bag.  Adhesive tape or a leg strap.  Steps 1. Wash your hands with soap and water. 2. Separate the dirty bag from your leg. 3. Pinch the rubber catheter with your fingers so that pee does not spill out. 4. Separate the catheter tube from the drainage tube where these tubes connect (at the connection valve). Do not let the tubes touch any surface. 5. Clean the end of the catheter tube with an alcohol wipe. Use a different alcohol wipe to clean the end of the drainage tube. 6. Connect the catheter tube to the drainage tube of the clean bag. 7. Attach the new bag to the leg with adhesive tape or a leg strap. 8. Wash your hands.  How to prevent infection and  other problems  Never pull on your catheter or try to remove it. Pulling can damage tissue in your body.  Always wash your hands before and after touching your catheter.  If a leg strap gets wet, replace it with a dry one.  Drink enough fluids to keep your pee clear or pale yellow, or as told by your doctor.  Do not let the drainage bag or tubing touch the floor.  Wear cotton underwear.  If you are male, wipe from front to back after you poop (have a bowel movement).  Check on the catheter often to make sure it works and the tubing is not twisted. Get help if:  Your pee is cloudy.  Your pee smells unusually bad.  Your  pee is not draining into the bag.  Your tube gets clogged.  Your catheter starts to leak.  Your bladder feels full. Get help right away if:  You have redness, swelling, or pain where the catheter enters your body.  You have fluid, pus, or a bad smell coming from the area where the catheter enters your body.  The area where the catheter enters your body feels warm.  You have a fever.  You have pain in your: ? Stomach (abdomen). ? Legs. ? Lower back. ? Bladder.  You see blood fill the catheter.  Your pee is pink or red.  You feel sick to your stomach (nauseous).  You throw up (vomit).  You have chills.  Your catheter gets pulled out. This information is not intended to replace advice given to you by your health care provider. Make sure you discuss any questions you have with your health care provider. Document Released: 08/07/2012 Document Revised: 03/10/2016 Document Reviewed: 09/25/2013 Elsevier Interactive Patient Education  2018 [facility] Instructions  Activity: Get plenty of rest for the remainder of the day. A responsible individual must stay with you for 24 hours following the procedure.  For the next 24 hours, DO NOT: -Drive a car -Paediatric nurse -Drink alcoholic beverages -Take any  medication unless instructed by your physician -Make any legal decisions or sign important papers.  Meals: Start with liquid foods such as gelatin or soup. Progress to regular foods as tolerated. Avoid greasy, spicy, heavy foods. If nausea and/or vomiting occur, drink only clear liquids until the nausea and/or vomiting subsides. Call your physician if vomiting continues.  Special Instructions/Symptoms: Your throat may feel dry or sore from the anesthesia or the breathing tube placed in your throat during surgery. If this causes discomfort, gargle with warm salt water. The discomfort should disappear within 24 hours.  If you had a scopolamine patch placed behind your ear for the management of post- operative nausea and/or vomiting:  1. The medication in the patch is effective for 72 hours, after which it should be removed.  Wrap patch in a tissue and discard in the trash. Wash hands thoroughly with soap and water. 2. You may remove the patch earlier than 72 hours if you experience unpleasant side effects which may include dry mouth, dizziness or visual disturbances. 3. Avoid touching the patch. Wash your hands with soap and water after contact with the patch.

## 2017-03-15 NOTE — Anesthesia Preprocedure Evaluation (Addendum)
Anesthesia Evaluation  Patient identified by MRN, date of birth, ID band Patient awake    Reviewed: Allergy & Precautions, NPO status , Patient's Chart, lab work & pertinent test results  Airway Mallampati: II  TM Distance: >3 FB     Dental  (+) Teeth Intact, Dental Advisory Given   Pulmonary neg pulmonary ROS, former smoker,    breath sounds clear to auscultation       Cardiovascular hypertension, Pt. on medications + dysrhythmias Atrial Fibrillation  Rhythm:Regular Rate:Normal  History noted CG   Neuro/Psych    GI/Hepatic Neg liver ROS, GERD  Controlled and Medicated,  Endo/Other  negative endocrine ROS  Renal/GU negative Renal ROS     Musculoskeletal   Abdominal   Peds  Hematology   Anesthesia Other Findings   Reproductive/Obstetrics                           Anesthesia Physical Anesthesia Plan  ASA: III  Anesthesia Plan: General   Post-op Pain Management:    Induction: Intravenous  PONV Risk Score and Plan: 2 and Treatment may vary due to age or medical condition, Ondansetron, Dexamethasone and Midazolam  Airway Management Planned: LMA  Additional Equipment:   Intra-op Plan:   Post-operative Plan: Extubation in OR  Informed Consent: I have reviewed the patients History and Physical, chart, labs and discussed the procedure including the risks, benefits and alternatives for the proposed anesthesia with the patient or authorized representative who has indicated his/her understanding and acceptance.   Dental advisory given  Plan Discussed with: CRNA and Anesthesiologist  Anesthesia Plan Comments:         Anesthesia Quick Evaluation

## 2017-03-15 NOTE — Op Note (Signed)
Preoperative diagnosis: BPH with lower urinary tract symptoms, weak stream Postoperative diagnosis: Same  Procedure: Cystoscopy with prostatic urethral lift x7  Surgeon: Junious Silk  Anesthesia: General  Indication for procedure: 81 year old white male with visually obstructing lateral prostatic lobes on cystoscopy.  He continued to have urinary symptoms after a bout of retention and elected to proceed with UroLift.  Findings: On cystoscopy the urethra was normal without stricture, the prostatic urethra was slightly elongated but no significant median lobe nor high bladder neck.  There were obstructing lateral lobes.  There was mild to moderate trabeculation of the bladder.  The previous findings from the Foley had near completely resolved.  There was possibly to slightest areas of erythema posteriorly about 5 mm each but no papillary tumors or significant areas of erythema. There was no stone or foreign body in the bladder.   Description of procedure: After consent was obtained patient brought to the operating room.  After adequate anesthesia he was placed in lithotomy position and prepped and draped in the usual sterile fashion.  A timeout was performed to confirm the patient and procedure.  The cystoscope was passed per urethra and the bladder inspected.  The UroLift device was passed and I placed a left proximal lift implant.  I then placed a second one in the left mid.  I noticed the tab the first 1 was protruding proximal to the bladder neck into the bladder and therefore with rigid graspers it was removed.  It did not need to be replaced as the second 1 was holding the tissue back appropriately.  I placed a third lift implant on the left slightly more distal and then a final fourth lift implant left distal just proximal to the veru.  This allowed 3 to remain in place on the left side attention was turned to the right and they placed a right proximal, right mid and right distal lift implant.  This  created an excellent channel.  At the apex the channel narrowed due to some anterior tissue but the overall channel was good and I stopped at a total of 6 implants. Hemostasis was good with typical mucosal bleeding noted. The scope was removed and an 18Fr foley catheter placed.  He was awakened and taken to the recovery room in stable condition.  Complications: None  Blood loss: Minimal  Specimens: None  Drains: 18Fr coude catheter

## 2017-03-15 NOTE — Anesthesia Procedure Notes (Signed)
Procedure Name: LMA Insertion Date/Time: 03/15/2017 9:52 AM Performed by: Wanita Chamberlain, CRNA Pre-anesthesia Checklist: Patient identified, Emergency Drugs available, Suction available, Patient being monitored and Timeout performed Patient Re-evaluated:Patient Re-evaluated prior to induction Oxygen Delivery Method: Circle system utilized Preoxygenation: Pre-oxygenation with 100% oxygen Induction Type: IV induction Ventilation: Mask ventilation without difficulty LMA: LMA inserted LMA Size: 4.0 Number of attempts: 1 Placement Confirmation: breath sounds checked- equal and bilateral and positive ETCO2 Tube secured with: Tape Dental Injury: Teeth and Oropharynx as per pre-operative assessment

## 2017-03-15 NOTE — Interval H&P Note (Signed)
History and Physical Interval Note:  03/15/2017 8:26 AM  Brian Horn  has presented today for surgery, with the diagnosis of BENIGN PROSTATICK HYPERPLASIA WITH URINARY RETENTION  The various methods of treatment have been discussed with the patient and family. After consideration of risks, benefits and other options for treatment, the patient has consented to  Procedure(s): CYSTOSCOPY WITH INSERTION OF UROLIFT (N/A) as a surgical intervention .  The patient's history has been reviewed, patient examined, no change in status, stable for surgery.  I have reviewed the patient's chart and labs.  Questions were answered to the patient's satisfaction.  He's been voiding without the foley. No dysuria or gross hematuria. No fever. Weak stream present with feeling of incomplete emptying, double voiding and frequency.   [name]

## 2017-03-15 NOTE — Anesthesia Postprocedure Evaluation (Signed)
Anesthesia Post Note  Patient: Brian Horn  Procedure(s) Performed: CYSTOSCOPY WITH INSERTION OF UROLIFT (N/A Prostate)     Patient location during evaluation: PACU Anesthesia Type: General Level of consciousness: awake Pain management: pain level controlled Vital Signs Assessment: post-procedure vital signs reviewed and stable Respiratory status: spontaneous breathing Cardiovascular status: stable Anesthetic complications: no    Last Vitals:  Vitals:   03/15/17 1115 03/15/17 1126  BP: 110/74 117/73  Pulse: 91 92  Resp: 18 13  Temp:    SpO2: 96% 96%    Last Pain:  Vitals:   03/15/17 0748  TempSrc: Oral                 Ladonne Sharples

## 2017-03-15 NOTE — Transfer of Care (Signed)
Immediate Anesthesia Transfer of Care Note  Patient: Brian Horn  Procedure(s) Performed: Procedure(s) (LRB): CYSTOSCOPY WITH INSERTION OF UROLIFT (N/A)  Patient Location: PACU  Anesthesia Type: General  Level of Consciousness: awake, sedated, patient cooperative and responds to stimulation  Airway & Oxygen Therapy: Patient Spontanous Breathing and Patient connected to [place] oxygen  Post-op Assessment: Report given to PACU RN, Post -op Vital signs reviewed and stable and Patient moving all extremities  Post vital signs: Reviewed and stable  Complications: No apparent anesthesia complications

## 2017-03-21 ENCOUNTER — Encounter (HOSPITAL_BASED_OUTPATIENT_CLINIC_OR_DEPARTMENT_OTHER): Payer: Self-pay | Admitting: Urology

## 2017-03-21 DIAGNOSIS — N401 Enlarged prostate with lower urinary tract symptoms: Secondary | ICD-10-CM | POA: Diagnosis not present

## 2017-03-21 DIAGNOSIS — R338 Other retention of urine: Secondary | ICD-10-CM | POA: Diagnosis not present

## 2017-03-29 NOTE — H&P (View-Only) (Signed)
Electrophysiology Office Note   Date:  03/30/2017   ID:  HARMAN LANGHANS, DOB 15-May-1935, MRN [id number]  PCP:  [name], MD  Cardiologist:   Primary Electrophysiologist:  Ethan Clayburn Meredith [name], MD    Chief Complaint  Patient presents with  . Follow-up    Persistent Afib     History of Present Illness: Brian Horn is a 81 y.o. male who is being seen today for the evaluation of atrial fibrillation at the request of [name], MD. Presenting today for electrophysiology evaluation.  He has a history of persistent atrial fibrillation, hypertension, hyperlipidemia.  He presents today for workup of his atrial fibrillation and as a preop visit.  He is currently feeling well.  When he was initially diagnosed with atrial fibrillation, he had fatigue weakness and shortness of breath.  Currently, he says that he is asymptomatic.  Today, denies symptoms of palpitations, chest pain, shortness of breath, orthopnea, PND, lower extremity edema, claudication, dizziness, presyncope, syncope, bleeding, or neurologic sequela. The patient is tolerating medications without difficulties.  His main symptoms today are of weakness and fatigue.  This has improved since he has been in atrial fibrillation, though he is certainly having symptoms.  He had urologic surgery on November 20 with restarting his Eliquis on November 21.  He has not missed any doses of this medication.  Past Medical History:  Diagnosis Date  . Blood dyscrasia    hemachromatosis, takes tx q year  . ED (erectile dysfunction)   . GERD (gastroesophageal reflux disease)   . Glaucoma    right eye  . Hematuria, gross   . History of kidney stones   . Hyperlipidemia   . Hypertension   . Inguinal hernia   . Paroxysmal atrial fibrillation (HCC)   . Peyronie's disease   . Pulmonic stenosis    mild  . Tinnitus    Past Surgical History:  Procedure Laterality Date  . APPENDECTOMY    . CHOLECYSTECTOMY    . colonscopy    .  CYSTOSCOPY WITH INSERTION OF UROLIFT N/A 03/15/2017   Procedure: CYSTOSCOPY WITH INSERTION OF UROLIFT;  Surgeon: [name], MD;  Location: [facility];  Service: Urology;  Laterality: N/A;  . EYE SURGERY     ioc for cataracts     Current Outpatient Medications  Medication Sig Dispense Refill  . apixaban (ELIQUIS) 5 MG TABS tablet Take 5 mg by mouth 2 (two) times daily.    . benazepril (LOTENSIN) 20 MG tablet Take 20 mg every evening by mouth.     . Cholecalciferol (VITAMIN D3) 1000 units CAPS Take 1 capsule every evening by mouth.     . Cinnamon 500 MG TABS Take 1 tablet by mouth daily.    [name] DILT-XR 180 MG 24 hr capsule Take 180 mg by mouth daily.    . finasteride (PROSCAR) 5 MG tablet Take 5 mg every evening by mouth.     . lansoprazole (PREVACID) 15 MG capsule Take 15 mg by mouth daily before breakfast.    . latanoprost (XALATAN) 0.005 % ophthalmic solution 1 drop at bedtime.    . Multiple Vitamins-Minerals (MULTIVITAMIN PO) Take every evening by mouth.     . simvastatin (ZOCOR) 20 MG tablet Take 20 mg by mouth daily.     No current facility-administered medications for this visit.     Allergies:   Lipitor [atorvastatin calcium]   Social History:  The patient  reports that he quit smoking about 45 years  ago. His smoking use included cigarettes. He has a 7.50 pack-year smoking history. he has never used smokeless tobacco. He reports that he drinks alcohol. He reports that he does not use drugs.   Family History:  The patient's family history includes Heart disease in his father and mother.    ROS:  Please see the history of present illness.   Otherwise, review of systems is positive for fatigue, weakness.   All other systems are reviewed and negative.   PHYSICAL EXAM: VS:  BP 120/72   Pulse 88   Ht 6' (1.829 m)   Wt 175 lb (79.4 kg)   BMI 23.73 kg/m  , BMI Body mass index is 23.73 kg/m. GEN: Well nourished, well developed, in no acute distress  HEENT:  normal  Neck: no JVD, carotid bruits, or masses Cardiac: iRRR; no murmurs, rubs, or gallops,no edema  Respiratory:  clear to auscultation bilaterally, normal work of breathing GI: soft, nontender, nondistended, + BS MS: no deformity or atrophy  Skin: warm and dry Neuro:  Strength and sensation are intact Psych: euthymic mood, full affect  EKG:  EKG is not ordered today. Personal review of the ekg ordered 02/24/17 shows atrial fibrillation   Recent Labs: 03/15/2017: BUN 13; Creatinine, Ser 0.90; Hemoglobin 13.3; Potassium 3.9; Sodium 143    Lipid Panel  No results found for: CHOL, TRIG, HDL, CHOLHDL, VLDL, LDLCALC, LDLDIRECT   Wt Readings from Last 3 Encounters:  03/30/17 175 lb (79.4 kg)  03/15/17 179 lb (81.2 kg)  02/24/17 175 lb 9.6 oz (79.7 kg)      Other studies Reviewed: Additional studies/ records that were reviewed today include: TTE 08/27/15  Review of the above records today demonstrates:  - Left ventricle: The cavity size was normal. Wall thickness was   increased in a pattern of mild LVH. Systolic function was   vigorous. The estimated ejection fraction was in the range of 65%   to 70%. Wall motion was normal; there were no regional wall   motion abnormalities. Doppler parameters are consistent with   abnormal left ventricular relaxation (grade 1 diastolic   dysfunction). The E/e&' ratio is <8, suggesting normal LV filling   pressure. - Aortic valve: Sclerosis without stenosis. Transvalvular velocity   was minimally increased. Mean gradient (S): 8 mm Hg. Peak   gradient (S): 17 mm Hg. - Left atrium: The atrium was normal in size. - Atrial septum: No defect or patent foramen ovale was identified. - Tricuspid valve: There was trivial regurgitation. - Pulmonic valve: Poorly visualized. Mild pulmonic stenosis. Peak   gradient of 32 mmHg. - Pulmonary arteries: Poorly visualized. PA peak pressure: 11 mm Hg   (S). - Inferior vena cava: The vessel was normal in size.  The   respirophasic diameter changes were in the normal range (= 50%),   consistent with normal central venous pressure.   ASSESSMENT AND PLAN:  1.  Persistent atrial fibrillation: Currently on Eliquis.  He continues to be in atrial fibrillation by auscultation today.  He is having weakness and fatigue, though this has improved since he has been in atrial fibrillation.  He would like a trial of sinus rhythm.  He had urologic surgery and started his Eliquis on November 21.  We Cordarrel Stiefel plan to start him on flecainide 100 mg twice a day 3 weeks after his dose of flecainide started.  We Dejana Pugsley plan for cardioversion 3 days later  This patients CHA2DS2-VASc Score and unadjusted Ischemic Stroke Rate (% per year)  is equal to 3.2 % stroke rate/year from a score of 3  Above score calculated as 1 point each if present [CHF, HTN, DM, Vascular=MI/PAD/Aortic Plaque, Age if 65-74, or Male] Above score calculated as 2 points each if present [Age > 75, or Stroke/TIA/TE]  2.  Hypertension: Well-controlled on amlodipine and benazepril.  3.  Hyperlipidemia: Continue simvastatin  Current medicines are reviewed at length with the patient today.   The patient does not have concerns regarding his medicines.  The following changes were made today: Start flecainide  Labs/ tests ordered today include:  No orders of the defined types were placed in this encounter.    Disposition:   FU with Zorian Gunderman 3 months  Signed, Younis Mathey Meredith [name], MD  03/30/2017 8:57 AM     [facility] [address] [place]  [address] [phone] (office) [phone] (fax)

## 2017-03-29 NOTE — Progress Notes (Signed)
Electrophysiology Office Note   Date:  03/30/2017   ID:  Brian Horn, DOB 05/01/1935, MRN [id number]  PCP:  [name], MD  Cardiologist:   Primary Electrophysiologist:  Kateena Degroote Meredith [name], MD    Chief Complaint  Patient presents with  . Follow-up    Persistent Afib     History of Present Illness: Brian Horn is a 81 y.o. male who is being seen today for the evaluation of atrial fibrillation at the request of [name], MD. Presenting today for electrophysiology evaluation.  He has a history of persistent atrial fibrillation, hypertension, hyperlipidemia.  He presents today for workup of his atrial fibrillation and as a preop visit.  He is currently feeling well.  When he was initially diagnosed with atrial fibrillation, he had fatigue weakness and shortness of breath.  Currently, he says that he is asymptomatic.  Today, denies symptoms of palpitations, chest pain, shortness of breath, orthopnea, PND, lower extremity edema, claudication, dizziness, presyncope, syncope, bleeding, or neurologic sequela. The patient is tolerating medications without difficulties.  His main symptoms today are of weakness and fatigue.  This has improved since he has been in atrial fibrillation, though he is certainly having symptoms.  He had urologic surgery on November 20 with restarting his Eliquis on November 21.  He has not missed any doses of this medication.  Past Medical History:  Diagnosis Date  . Blood dyscrasia    hemachromatosis, takes tx q year  . ED (erectile dysfunction)   . GERD (gastroesophageal reflux disease)   . Glaucoma    right eye  . Hematuria, gross   . History of kidney stones   . Hyperlipidemia   . Hypertension   . Inguinal hernia   . Paroxysmal atrial fibrillation (HCC)   . Peyronie's disease   . Pulmonic stenosis    mild  . Tinnitus    Past Surgical History:  Procedure Laterality Date  . APPENDECTOMY    . CHOLECYSTECTOMY    . colonscopy    .  CYSTOSCOPY WITH INSERTION OF UROLIFT N/A 03/15/2017   Procedure: CYSTOSCOPY WITH INSERTION OF UROLIFT;  Surgeon: [name], MD;  Location: [facility];  Service: Urology;  Laterality: N/A;  . EYE SURGERY     ioc for cataracts     Current Outpatient Medications  Medication Sig Dispense Refill  . apixaban (ELIQUIS) 5 MG TABS tablet Take 5 mg by mouth 2 (two) times daily.    . benazepril (LOTENSIN) 20 MG tablet Take 20 mg every evening by mouth.     . Cholecalciferol (VITAMIN D3) 1000 units CAPS Take 1 capsule every evening by mouth.     . Cinnamon 500 MG TABS Take 1 tablet by mouth daily.    [name] DILT-XR 180 MG 24 hr capsule Take 180 mg by mouth daily.    . finasteride (PROSCAR) 5 MG tablet Take 5 mg every evening by mouth.     . lansoprazole (PREVACID) 15 MG capsule Take 15 mg by mouth daily before breakfast.    . latanoprost (XALATAN) 0.005 % ophthalmic solution 1 drop at bedtime.    . Multiple Vitamins-Minerals (MULTIVITAMIN PO) Take every evening by mouth.     . simvastatin (ZOCOR) 20 MG tablet Take 20 mg by mouth daily.     No current facility-administered medications for this visit.     Allergies:   Lipitor [atorvastatin calcium]   Social History:  The patient  reports that he quit smoking about 45 years  ago. His smoking use included cigarettes. He has a 7.50 pack-year smoking history. he has never used smokeless tobacco. He reports that he drinks alcohol. He reports that he does not use drugs.   Family History:  The patient's family history includes Heart disease in his father and mother.    ROS:  Please see the history of present illness.   Otherwise, review of systems is positive for fatigue, weakness.   All other systems are reviewed and negative.   PHYSICAL EXAM: VS:  BP 120/72   Pulse 88   Ht 6' (1.829 m)   Wt 175 lb (79.4 kg)   BMI 23.73 kg/m  , BMI Body mass index is 23.73 kg/m. GEN: Well nourished, well developed, in no acute distress  HEENT:  normal  Neck: no JVD, carotid bruits, or masses Cardiac: iRRR; no murmurs, rubs, or gallops,no edema  Respiratory:  clear to auscultation bilaterally, normal work of breathing GI: soft, nontender, nondistended, + BS MS: no deformity or atrophy  Skin: warm and dry Neuro:  Strength and sensation are intact Psych: euthymic mood, full affect  EKG:  EKG is not ordered today. Personal review of the ekg ordered 02/24/17 shows atrial fibrillation   Recent Labs: 03/15/2017: BUN 13; Creatinine, Ser 0.90; Hemoglobin 13.3; Potassium 3.9; Sodium 143    Lipid Panel  No results found for: CHOL, TRIG, HDL, CHOLHDL, VLDL, LDLCALC, LDLDIRECT   Wt Readings from Last 3 Encounters:  03/30/17 175 lb (79.4 kg)  03/15/17 179 lb (81.2 kg)  02/24/17 175 lb 9.6 oz (79.7 kg)      Other studies Reviewed: Additional studies/ records that were reviewed today include: TTE 08/27/15  Review of the above records today demonstrates:  - Left ventricle: The cavity size was normal. Wall thickness was   increased in a pattern of mild LVH. Systolic function was   vigorous. The estimated ejection fraction was in the range of 65%   to 70%. Wall motion was normal; there were no regional wall   motion abnormalities. Doppler parameters are consistent with   abnormal left ventricular relaxation (grade 1 diastolic   dysfunction). The E/e&' ratio is <8, suggesting normal LV filling   pressure. - Aortic valve: Sclerosis without stenosis. Transvalvular velocity   was minimally increased. Mean gradient (S): 8 mm Hg. Peak   gradient (S): 17 mm Hg. - Left atrium: The atrium was normal in size. - Atrial septum: No defect or patent foramen ovale was identified. - Tricuspid valve: There was trivial regurgitation. - Pulmonic valve: Poorly visualized. Mild pulmonic stenosis. Peak   gradient of 32 mmHg. - Pulmonary arteries: Poorly visualized. PA peak pressure: 11 mm Hg   (S). - Inferior vena cava: The vessel was normal in size.  The   respirophasic diameter changes were in the normal range (= 50%),   consistent with normal central venous pressure.   ASSESSMENT AND PLAN:  1.  Persistent atrial fibrillation: Currently on Eliquis.  He continues to be in atrial fibrillation by auscultation today.  He is having weakness and fatigue, though this has improved since he has been in atrial fibrillation.  He would like a trial of sinus rhythm.  He had urologic surgery and started his Eliquis on November 21.  We Dedrick Heffner plan to start him on flecainide 100 mg twice a day 3 weeks after his dose of flecainide started.  We Lonya Johannesen plan for cardioversion 3 days later  This patients CHA2DS2-VASc Score and unadjusted Ischemic Stroke Rate (% per year)  is equal to 3.2 % stroke rate/year from a score of 3  Above score calculated as 1 point each if present [CHF, HTN, DM, Vascular=MI/PAD/Aortic Plaque, Age if 65-74, or Male] Above score calculated as 2 points each if present [Age > 75, or Stroke/TIA/TE]  2.  Hypertension: Well-controlled on amlodipine and benazepril.  3.  Hyperlipidemia: Continue simvastatin  Current medicines are reviewed at length with the patient today.   The patient does not have concerns regarding his medicines.  The following changes were made today: Start flecainide  Labs/ tests ordered today include:  No orders of the defined types were placed in this encounter.    Disposition:   FU with [place] Severs 3 months  Signed, Kambrey Hagger Meredith [name], MD  03/30/2017 8:57 AM     [facility] [address] [place] [address] [phone] (office) [phone] (fax)

## 2017-03-30 ENCOUNTER — Other Ambulatory Visit: Payer: Self-pay | Admitting: Cardiology

## 2017-03-30 ENCOUNTER — Encounter: Payer: Self-pay | Admitting: Cardiology

## 2017-03-30 ENCOUNTER — Ambulatory Visit (INDEPENDENT_AMBULATORY_CARE_PROVIDER_SITE_OTHER): Payer: Medicare Other | Admitting: Cardiology

## 2017-03-30 VITALS — BP 120/72 | HR 88 | Ht 72.0 in | Wt 175.0 lb

## 2017-03-30 DIAGNOSIS — I10 Essential (primary) hypertension: Secondary | ICD-10-CM | POA: Diagnosis not present

## 2017-03-30 DIAGNOSIS — E785 Hyperlipidemia, unspecified: Secondary | ICD-10-CM | POA: Diagnosis not present

## 2017-03-30 DIAGNOSIS — I4819 Other persistent atrial fibrillation: Secondary | ICD-10-CM

## 2017-03-30 DIAGNOSIS — I481 Persistent atrial fibrillation: Secondary | ICD-10-CM

## 2017-03-30 DIAGNOSIS — Z79899 Other long term (current) drug therapy: Secondary | ICD-10-CM | POA: Diagnosis not present

## 2017-03-30 NOTE — Patient Instructions (Addendum)
Medication Instructions:  Your physician recommends that you continue on your current medications as directed. Please refer to the Current Medication list given to you today.  * If you need a refill on your cardiac medications before your next appointment, please call your pharmacy. *  Labwork: None ordered  Testing/Procedures: Your physician has recommended that you have a Cardioversion (DCCV). Electrical Cardioversion uses a jolt of electricity to your heart either through paddles or wired patches attached to your chest. This is a controlled, usually prescheduled, procedure. Defibrillation is done under light anesthesia in the hospital, and you usually go home the day of the procedure. This is done to get your heart back into a normal rhythm. You are not awake for the procedure.   Nurse will call you to arrange this procedure.  MAKE SURE TO CONTINUE TAKING YOUR ELIQUIS TWICE DAILY.  IT IS   IMPORTANT NOT TO MISS A DOSE OF THIS MEDICATION.  Follow-Up: Your physician recommends that you schedule a follow-up appointment in: 3 months with Dr. Curt Bears.  Thank you for choosing [facility]!!   Trinidad Curet, RN [phone]  Any Other Special Instructions Will Be Listed Below (If Applicable).   Electrical Cardioversion Electrical cardioversion is the delivery of a jolt of electricity to restore a normal rhythm to the heart. A rhythm that is too fast or is not regular keeps the heart from pumping well. In this procedure, sticky patches or metal paddles are placed on the chest to deliver electricity to the heart from a device. This procedure may be done in an emergency if:  There is low or no blood pressure as a result of the heart rhythm.  Normal rhythm must be restored as fast as possible to protect the brain and heart from further damage.  It may save a life.  This procedure may also be done for irregular or fast heart rhythms that are not immediately life-threatening. Tell a  health care provider about:  Any allergies you have.  All medicines you are taking, including vitamins, herbs, eye drops, creams, and over-the-counter medicines.  Any problems you or family members have had with anesthetic medicines.  Any blood disorders you have.  Any surgeries you have had.  Any medical conditions you have.  Whether you are pregnant or may be pregnant. What are the risks? Generally, this is a safe procedure. However, problems may occur, including:  Allergic reactions to medicines.  A blood clot that breaks free and travels to other parts of your body.  The possible return of an abnormal heart rhythm within hours or days after the procedure.  Your heart stopping (cardiac arrest). This is rare.  What happens before the procedure? Medicines  Your health care provider may have you start taking: ? Blood-thinning medicines (anticoagulants) so your blood does not clot as easily. ? Medicines may be given to help stabilize your heart rate and rhythm.  Ask your health care provider about changing or stopping your regular medicines. This is especially important if you are taking diabetes medicines or blood thinners. General instructions  Plan to have someone take you home from the hospital or clinic.  If you will be going home right after the procedure, plan to have someone with you for 24 hours.  Follow instructions from your health care provider about eating or drinking restrictions. What happens during the procedure?  To lower your risk of infection: ? Your health care team will wash or sanitize their hands. ? Your skin will be  washed with soap.  An IV tube will be inserted into one of your veins.  You will be given a medicine to help you relax (sedative).  Sticky patches (electrodes) or metal paddles may be placed on your chest.  An electrical shock will be delivered. The procedure may vary among health care providers and hospitals. What happens  after the procedure?  Your blood pressure, heart rate, breathing rate, and blood oxygen level will be monitored until the medicines you were given have worn off.  Do not drive for 24 hours if you were given a sedative.  Your heart rhythm will be watched to make sure it does not change. This information is not intended to replace advice given to you by your health care provider. Make sure you discuss any questions you have with your health care provider. Document Released: 04/02/2002 Document Revised: 12/10/2015 Document Reviewed: 10/17/2015 Elsevier Interactive Patient Education  2017 [employer].

## 2017-04-12 NOTE — Addendum Note (Signed)
Addended by: Stanton Kidney on: 04/12/2017 04:01 PM   Modules accepted: Orders

## 2017-04-15 ENCOUNTER — Encounter: Payer: Self-pay | Admitting: *Deleted

## 2017-04-15 ENCOUNTER — Other Ambulatory Visit: Payer: Self-pay | Admitting: Cardiology

## 2017-04-15 ENCOUNTER — Telehealth: Payer: Self-pay | Admitting: *Deleted

## 2017-04-15 MED ORDER — FLECAINIDE ACETATE 100 MG PO TABS: 100.0000 mg | ORAL_TABLET | Freq: Two times a day (BID) | ORAL | 3 refills | Status: DC

## 2017-04-15 NOTE — Telephone Encounter (Signed)
Advised pt to start Flecainide 100 mg BID on January 1st, 2019. Advised of DCCV on 04/29/17.  Instructions reviewed w/ patient and letter mailed to home address.  Pt aware that if he is in NSR day of DCCV that he will be sent home.  Also reminded of importance of continuing taking Eliquis BID. Post Flecainide start GXT scheduled for 05/03/17. 45mo f/u w/ Camnitz scheduled for 07/01/17. Patient verbalized understanding and agreeable to plan.

## 2017-04-25 DIAGNOSIS — Z1211 Encounter for screening for malignant neoplasm of colon: Secondary | ICD-10-CM | POA: Diagnosis not present

## 2017-04-29 ENCOUNTER — Encounter (HOSPITAL_COMMUNITY): Payer: Self-pay | Admitting: Anesthesiology

## 2017-04-29 ENCOUNTER — Encounter (HOSPITAL_COMMUNITY)
Admission: RE | Disposition: A | Discharge status: Home / Self Care | Payer: Self-pay | Source: Ambulatory Visit | Attending: Cardiovascular Disease

## 2017-04-29 ENCOUNTER — Other Ambulatory Visit: Payer: Self-pay

## 2017-04-29 ENCOUNTER — Ambulatory Visit (HOSPITAL_COMMUNITY)
Admission: RE | Admit: 2017-04-29 | Discharge: 2017-04-29 | Disposition: A | Discharge status: Home / Self Care | Payer: Medicare Other | Source: Ambulatory Visit | Attending: Cardiovascular Disease | Admitting: Cardiovascular Disease

## 2017-04-29 ENCOUNTER — Ambulatory Visit (HOSPITAL_COMMUNITY): Payer: Medicare Other | Admitting: Anesthesiology

## 2017-04-29 DIAGNOSIS — Z79899 Other long term (current) drug therapy: Secondary | ICD-10-CM | POA: Diagnosis not present

## 2017-04-29 DIAGNOSIS — I10 Essential (primary) hypertension: Secondary | ICD-10-CM | POA: Diagnosis not present

## 2017-04-29 DIAGNOSIS — E785 Hyperlipidemia, unspecified: Secondary | ICD-10-CM | POA: Diagnosis not present

## 2017-04-29 DIAGNOSIS — Z87891 Personal history of nicotine dependence: Secondary | ICD-10-CM | POA: Diagnosis not present

## 2017-04-29 DIAGNOSIS — H409 Unspecified glaucoma: Secondary | ICD-10-CM | POA: Insufficient documentation

## 2017-04-29 DIAGNOSIS — Z7901 Long term (current) use of anticoagulants: Secondary | ICD-10-CM | POA: Diagnosis not present

## 2017-04-29 DIAGNOSIS — I480 Paroxysmal atrial fibrillation: Secondary | ICD-10-CM | POA: Diagnosis not present

## 2017-04-29 DIAGNOSIS — K4090 Unilateral inguinal hernia, without obstruction or gangrene, not specified as recurrent: Secondary | ICD-10-CM | POA: Diagnosis not present

## 2017-04-29 DIAGNOSIS — K219 Gastro-esophageal reflux disease without esophagitis: Secondary | ICD-10-CM | POA: Diagnosis not present

## 2017-04-29 DIAGNOSIS — I4819 Other persistent atrial fibrillation: Secondary | ICD-10-CM

## 2017-04-29 DIAGNOSIS — I481 Persistent atrial fibrillation: Secondary | ICD-10-CM | POA: Diagnosis not present

## 2017-04-29 HISTORY — PX: CARDIOVERSION: SHX1299

## 2017-04-29 LAB — POCT I-STAT, CHEM 8
BUN: 15 mg/dL (ref 6–20)
CREATININE: 0.9 mg/dL (ref 0.61–1.24)
Calcium, Ion: 1.04 mmol/L — ABNORMAL LOW (ref 1.15–1.40)
Chloride: 111 mmol/L (ref 101–111)
GLUCOSE: 99 mg/dL (ref 65–99)
HCT: 36 % — ABNORMAL LOW (ref 39.0–52.0)
Hemoglobin: 12.2 g/dL — ABNORMAL LOW (ref 13.0–17.0)
Potassium: 3.8 mmol/L (ref 3.5–5.1)
Sodium: 143 mmol/L (ref 135–145)
TCO2: 21 mmol/L — AB (ref 22–32)

## 2017-04-29 SURGERY — CARDIOVERSION
Anesthesia: General

## 2017-04-29 MED ORDER — PROPOFOL 10 MG/ML IV BOLUS
INTRAVENOUS | Status: DC | PRN
  Administered 2017-04-29: 30 mg via INTRAVENOUS
  Administered 2017-04-29: 50 mg via INTRAVENOUS
  Administered 2017-04-29: 20 mg via INTRAVENOUS
  Administered 2017-04-29: 30 mg via INTRAVENOUS

## 2017-04-29 MED ORDER — SODIUM CHLORIDE 0.9 % IV SOLN
INTRAVENOUS | Status: DC
Start: 2017-04-29 — End: 2017-04-29
  Administered 2017-04-29: 11:00:00 via INTRAVENOUS

## 2017-04-29 NOTE — Discharge Instructions (Signed)
Electrical Cardioversion, Care After °This sheet gives you information about how to care for yourself after your procedure. Your health care provider may also give you more specific instructions. If you have problems or questions, contact your health care provider. °What can I expect after the procedure? °After the procedure, it is common to have: °· Some redness on the skin where the shocks were given. ° °Follow these instructions at home: °· Do not drive for 24 hours if you were given a medicine to help you relax (sedative). °· Take over-the-counter and prescription medicines only as told by your health care provider. °· Ask your health care provider how to check your pulse. Check it often. °· Rest for 48 hours after the procedure or as told by your health care provider. °· Avoid or limit your caffeine use as told by your health care provider. °Contact a health care provider if: °· You feel like your heart is beating too quickly or your pulse is not regular. °· You have a serious muscle cramp that does not go away. °Get help right away if: °· You have discomfort in your chest. °· You are dizzy or you feel faint. °· You have trouble breathing or you are short of breath. °· Your speech is slurred. °· You have trouble moving an arm or leg on one side of your body. °· Your fingers or toes turn cold or blue. °This information is not intended to replace advice given to you by your health care provider. Make sure you discuss any questions you have with your health care provider. °Document Released: 01/31/2013 Document Revised: 11/14/2015 Document Reviewed: 10/17/2015 °Elsevier Interactive Patient Education © 2018 Elsevier Inc. ° °

## 2017-04-29 NOTE — Interval H&P Note (Signed)
History and Physical Interval Note:  04/29/2017 9:07 AM  Brian Horn  has presented today for surgery, with the diagnosis of afib  The various methods of treatment have been discussed with the patient and family. After consideration of risks, benefits and other options for treatment, the patient has consented to  Procedure(s): CARDIOVERSION (N/A) as a surgical intervention .  The patient's history has been reviewed, patient examined, no change in status, stable for surgery.  I have reviewed the patient's chart and labs.  Questions were answered to the patient's satisfaction.     Mell Mellott

## 2017-04-29 NOTE — Transfer of Care (Signed)
Immediate Anesthesia Transfer of Care Note  Patient: Brian Horn  Procedure(s) Performed: CARDIOVERSION (N/A )  Patient Location: Endoscopy Unit  Anesthesia Type:General  Level of Consciousness: awake, oriented and patient cooperative  Airway & Oxygen Therapy: Patient Spontanous Breathing and Patient connected to nasal cannula oxygen  Post-op Assessment: Report given to RN and Post -op Vital signs reviewed and stable  Post vital signs: Reviewed  Last Vitals:  Vitals:   04/29/17 0958  BP: (!) 166/100  Pulse: 83  Resp: 17  Temp: 36.4 C    Last Pain:  Vitals:   04/29/17 0958  TempSrc: Oral         Complications: No apparent anesthesia complications

## 2017-04-29 NOTE — Op Note (Signed)
Procedure: Electrical Cardioversion Indications:  Atrial Fibrillation  Procedure Details:  Consent: Risks of procedure as well as the alternatives and risks of each were explained to the (patient/caregiver).  Consent for procedure obtained.  Time Out: Verified patient identification, verified procedure, site/side was marked, verified correct patient position, special equipment/implants available, medications/allergies/relevent history reviewed, required imaging and test results available.  Performed  Patient placed on cardiac monitor, pulse oximetry, supplemental oxygen as necessary.  Sedation given: propofol 120 mg IV, Dr. Ermalene Postin Pacer pads placed anterior and posterior chest.  Cardioverted 1 time(s).  Cardioversion with synchronized biphasic 120J shock.  Evaluation: Findings: Post procedure EKG shows: NSR Complications: None Patient did tolerate procedure well.  Time Spent Directly with the Patient:  30 minutes   Brian Horn 04/29/2017, 11:06 AM

## 2017-04-29 NOTE — Anesthesia Preprocedure Evaluation (Signed)
Anesthesia Evaluation  Patient identified by MRN, date of birth, ID band Patient awake    Reviewed: Allergy & Precautions, NPO status , Patient's Chart, lab work & pertinent test results  Airway Mallampati: II  TM Distance: >3 FB Neck ROM: Full    Dental  (+) Teeth Intact, Dental Advisory Given   Pulmonary former smoker,    breath sounds clear to auscultation- rhonchi       Cardiovascular hypertension, Pt. on medications  Rhythm:Irregular Rate:Normal     Neuro/Psych negative neurological ROS  negative psych ROS   GI/Hepatic GERD  Medicated and Controlled,  Endo/Other    Renal/GU      Musculoskeletal   Abdominal   Peds  Hematology   Anesthesia Other Findings   Reproductive/Obstetrics                             Anesthesia Physical Anesthesia Plan  ASA: II  Anesthesia Plan: General   Post-op Pain Management:    Induction: Intravenous  PONV Risk Score and Plan: Treatment may vary due to age or medical condition  Airway Management Planned: Mask and Natural Airway  Additional Equipment:   Intra-op Plan:   Post-operative Plan:   Informed Consent: I have reviewed the patients History and Physical, chart, labs and discussed the procedure including the risks, benefits and alternatives for the proposed anesthesia with the patient or authorized representative who has indicated his/her understanding and acceptance.   Dental advisory given  Plan Discussed with: Anesthesiologist and CRNA  Anesthesia Plan Comments:         Anesthesia Quick Evaluation

## 2017-04-29 NOTE — Anesthesia Procedure Notes (Signed)
Procedure Name: General with mask airway Date/Time: 04/29/2017 10:50 AM Performed by: Jenne Campus, CRNA Pre-anesthesia Checklist: Patient identified, Emergency Drugs available, Timeout performed, Patient being monitored and Suction available Patient Re-evaluated:Patient Re-evaluated prior to induction Oxygen Delivery Method: Ambu bag Preoxygenation: Pre-oxygenation with 100% oxygen Induction Type: IV induction Ventilation: Mask ventilation without difficulty

## 2017-04-30 ENCOUNTER — Encounter (HOSPITAL_COMMUNITY): Payer: Self-pay | Admitting: Cardiovascular Disease

## 2017-05-02 DIAGNOSIS — D649 Anemia, unspecified: Secondary | ICD-10-CM | POA: Diagnosis not present

## 2017-05-02 DIAGNOSIS — R195 Other fecal abnormalities: Secondary | ICD-10-CM | POA: Diagnosis not present

## 2017-05-02 DIAGNOSIS — I480 Paroxysmal atrial fibrillation: Secondary | ICD-10-CM | POA: Diagnosis not present

## 2017-05-02 NOTE — Anesthesia Postprocedure Evaluation (Signed)
Anesthesia Post Note  Patient: Brian Horn  Procedure(s) Performed: CARDIOVERSION (N/A )     Patient location during evaluation: Endoscopy Anesthesia Type: General Level of consciousness: awake and alert Pain management: pain level controlled Vital Signs Assessment: post-procedure vital signs reviewed and stable Respiratory status: spontaneous breathing, nonlabored ventilation, respiratory function stable and patient connected to nasal cannula oxygen Cardiovascular status: blood pressure returned to baseline and stable Postop Assessment: no apparent nausea or vomiting Anesthetic complications: no    Last Vitals:  Vitals:   04/29/17 1120 04/29/17 1125  BP: 131/76 (!) 149/97  Pulse: 62 62  Resp: 12 18  Temp:    SpO2: 98% 99%    Last Pain:  Vitals:   04/29/17 1110  TempSrc: Oral                 Patric Buckhalter

## 2017-05-03 ENCOUNTER — Ambulatory Visit: Payer: Medicare Other

## 2017-05-03 ENCOUNTER — Telehealth: Payer: Self-pay | Admitting: *Deleted

## 2017-05-03 MED ORDER — FLECAINIDE ACETATE 50 MG PO TABS: 50.0000 mg | ORAL_TABLET | Freq: Two times a day (BID) | ORAL | 3 refills | Status: DC

## 2017-05-03 NOTE — Telephone Encounter (Signed)
Pt came in to office today for GXT post Flecainide initiation.  EKG abnormal, reviewed w/ Dr. Curt Bears.  Orders to decrease Flecainide to 50 mg BID and f/u GXT in a week/two. GXT scheduled for 1/22

## 2017-05-04 DIAGNOSIS — R3915 Urgency of urination: Secondary | ICD-10-CM | POA: Diagnosis not present

## 2017-05-04 DIAGNOSIS — N401 Enlarged prostate with lower urinary tract symptoms: Secondary | ICD-10-CM | POA: Diagnosis not present

## 2017-05-04 DIAGNOSIS — R972 Elevated prostate specific antigen [PSA]: Secondary | ICD-10-CM | POA: Diagnosis not present

## 2017-05-16 DIAGNOSIS — R195 Other fecal abnormalities: Secondary | ICD-10-CM | POA: Diagnosis not present

## 2017-05-17 ENCOUNTER — Ambulatory Visit (INDEPENDENT_AMBULATORY_CARE_PROVIDER_SITE_OTHER): Payer: Medicare Other

## 2017-05-17 DIAGNOSIS — D225 Melanocytic nevi of trunk: Secondary | ICD-10-CM | POA: Diagnosis not present

## 2017-05-17 DIAGNOSIS — L821 Other seborrheic keratosis: Secondary | ICD-10-CM | POA: Diagnosis not present

## 2017-05-17 DIAGNOSIS — Z85820 Personal history of malignant melanoma of skin: Secondary | ICD-10-CM | POA: Diagnosis not present

## 2017-05-17 DIAGNOSIS — I481 Persistent atrial fibrillation: Secondary | ICD-10-CM

## 2017-05-17 DIAGNOSIS — D1801 Hemangioma of skin and subcutaneous tissue: Secondary | ICD-10-CM | POA: Diagnosis not present

## 2017-05-17 DIAGNOSIS — Z79899 Other long term (current) drug therapy: Secondary | ICD-10-CM

## 2017-05-17 DIAGNOSIS — L570 Actinic keratosis: Secondary | ICD-10-CM | POA: Diagnosis not present

## 2017-05-17 LAB — EXERCISE TOLERANCE TEST
CHL RATE OF PERCEIVED EXERTION: 17
CSEPED: 6 min
CSEPEW: 7 METS
Exercise duration (sec): 0 s
MPHR: 141 {beats}/min
Peak HR: 122 {beats}/min
Percent HR: 86 %
Rest HR: 64 {beats}/min

## 2017-05-26 DIAGNOSIS — N401 Enlarged prostate with lower urinary tract symptoms: Secondary | ICD-10-CM | POA: Diagnosis not present

## 2017-05-26 DIAGNOSIS — I480 Paroxysmal atrial fibrillation: Secondary | ICD-10-CM | POA: Diagnosis not present

## 2017-05-26 DIAGNOSIS — I10 Essential (primary) hypertension: Secondary | ICD-10-CM | POA: Diagnosis not present

## 2017-05-26 DIAGNOSIS — H409 Unspecified glaucoma: Secondary | ICD-10-CM | POA: Diagnosis not present

## 2017-05-27 DIAGNOSIS — C180 Malignant neoplasm of cecum: Secondary | ICD-10-CM | POA: Diagnosis not present

## 2017-05-30 DIAGNOSIS — R195 Other fecal abnormalities: Secondary | ICD-10-CM | POA: Diagnosis not present

## 2017-05-30 DIAGNOSIS — D126 Benign neoplasm of colon, unspecified: Secondary | ICD-10-CM | POA: Diagnosis not present

## 2017-05-30 DIAGNOSIS — C189 Malignant neoplasm of colon, unspecified: Secondary | ICD-10-CM | POA: Diagnosis not present

## 2017-05-30 DIAGNOSIS — D509 Iron deficiency anemia, unspecified: Secondary | ICD-10-CM | POA: Diagnosis not present

## 2017-05-31 ENCOUNTER — Encounter: Payer: Self-pay | Admitting: Cardiology

## 2017-05-31 DIAGNOSIS — C189 Malignant neoplasm of colon, unspecified: Secondary | ICD-10-CM | POA: Diagnosis not present

## 2017-05-31 DIAGNOSIS — D126 Benign neoplasm of colon, unspecified: Secondary | ICD-10-CM | POA: Diagnosis not present

## 2017-06-06 DIAGNOSIS — C189 Malignant neoplasm of colon, unspecified: Secondary | ICD-10-CM | POA: Diagnosis not present

## 2017-06-06 DIAGNOSIS — D126 Benign neoplasm of colon, unspecified: Secondary | ICD-10-CM | POA: Diagnosis not present

## 2017-06-07 ENCOUNTER — Ambulatory Visit (INDEPENDENT_AMBULATORY_CARE_PROVIDER_SITE_OTHER): Payer: Medicare Other | Admitting: Cardiology

## 2017-06-07 ENCOUNTER — Encounter: Payer: Self-pay | Admitting: Cardiology

## 2017-06-07 VITALS — BP 140/82 | HR 64 | Ht 72.0 in | Wt 177.0 lb

## 2017-06-07 DIAGNOSIS — I481 Persistent atrial fibrillation: Secondary | ICD-10-CM | POA: Diagnosis not present

## 2017-06-07 DIAGNOSIS — E785 Hyperlipidemia, unspecified: Secondary | ICD-10-CM | POA: Diagnosis not present

## 2017-06-07 DIAGNOSIS — I10 Essential (primary) hypertension: Secondary | ICD-10-CM

## 2017-06-07 DIAGNOSIS — I4819 Other persistent atrial fibrillation: Secondary | ICD-10-CM

## 2017-06-07 NOTE — Progress Notes (Signed)
Electrophysiology Office Note   Date:  06/07/2017   ID:  Brian Horn, DOB 02/28/1936, MRN [id number]  PCP:  Brian Orn, MD  Cardiologist:   Primary Electrophysiologist:  Brian Boule Meredith [name], MD    Chief Complaint  Patient presents with  . Follow-up    Persistent Afib     History of Present Illness: Brian Horn is a 82 y.o. male who is being seen today for the evaluation of atrial fibrillation at the request of Brian Orn, MD. Presenting today for electrophysiology evaluation.  He has a history of persistent atrial fibrillation, hypertension, hyperlipidemia.  He was put on flecainide at his last visit.  He had QRS widening a left bundle branch pattern and thus flecainide was stopped.  He did have a cardioversion while on flecainide and remains in sinus rhythm.  He feels well without major complaint today.  Fortunately, he was recently diagnosed with cancer and is planning to have primary follow-up appointment coming up.  Today, denies symptoms of palpitations, chest pain, shortness of breath, orthopnea, PND, lower extremity edema, claudication, dizziness, presyncope, syncope, bleeding, or neurologic sequela. The patient is tolerating medications without difficulties.    Past Medical History:  Diagnosis Date  . Blood dyscrasia    hemachromatosis, takes tx q year  . ED (erectile dysfunction)   . GERD (gastroesophageal reflux disease)   . Glaucoma    right eye  . Hematuria, gross   . History of kidney stones   . Hyperlipidemia   . Hypertension   . Inguinal hernia   . Paroxysmal atrial fibrillation (HCC)   . Peyronie's disease   . Pulmonic stenosis    mild  . Tinnitus    Past Surgical History:  Procedure Laterality Date  . APPENDECTOMY    . CARDIOVERSION N/A 04/29/2017   Procedure: CARDIOVERSION;  Surgeon: [name], MD;  Location: [facility];  Service: Cardiovascular;  Laterality: N/A;  . CHOLECYSTECTOMY    . colonscopy    . CYSTOSCOPY WITH INSERTION  OF UROLIFT N/A 03/15/2017   Procedure: CYSTOSCOPY WITH INSERTION OF UROLIFT;  Surgeon: [name], MD;  Location: [facility];  Service: Urology;  Laterality: N/A;  . EYE SURGERY     ioc for cataracts     Current Outpatient Medications  Medication Sig Dispense Refill  . apixaban (ELIQUIS) 5 MG TABS tablet Take 5 mg by mouth 2 (two) times daily.    . benazepril (LOTENSIN) 20 MG tablet Take 20 mg every evening by mouth.     . Cholecalciferol (VITAMIN D3) 1000 units CAPS Take 1,000 Units by mouth every evening.     . Cinnamon 500 MG TABS Take 500 mg by mouth daily.     [name] DILT-XR 180 MG 24 hr capsule Take 180 mg by mouth daily.    . finasteride (PROSCAR) 5 MG tablet Take 5 mg every evening by mouth.     . lansoprazole (PREVACID) 15 MG capsule Take 15 mg by mouth daily before breakfast.    . latanoprost (XALATAN) 0.005 % ophthalmic solution Place 1 drop into both eyes at bedtime.     . Multiple Vitamins-Minerals (MULTIVITAMIN PO) Take 1 tablet by mouth every evening.     . simvastatin (ZOCOR) 10 MG tablet Take 10 mg by mouth every evening.      No current facility-administered medications for this visit.     Allergies:   Lipitor [atorvastatin calcium]   Social History:  The patient  reports that he quit smoking  about 45 years ago. His smoking use included cigarettes. He has a 7.50 pack-year smoking history. he has never used smokeless tobacco. He reports that he drinks alcohol. He reports that he does not use drugs.   Family History:  The patient's family history includes Heart disease in his father and mother.   ROS:  Please see the history of present illness.   Otherwise, review of systems is positive for none.   All other systems are reviewed and negative.   PHYSICAL EXAM: VS:  BP 140/82   Pulse 64   Ht 6' (1.829 m)   Wt 177 lb (80.3 kg)   BMI 24.01 kg/m  , BMI Body mass index is 24.01 kg/m. GEN: Well nourished, well developed, in no acute distress  HEENT:  normal  Neck: no JVD, carotid bruits, or masses Cardiac: RRR; no murmurs, rubs, or gallops,no edema  Respiratory:  clear to auscultation bilaterally, normal work of breathing GI: soft, nontender, nondistended, + BS MS: no deformity or atrophy  Skin: warm and dry Neuro:  Strength and sensation are intact Psych: euthymic mood, full affect  EKG:  EKG is ordered today. Personal review of the ekg ordered  shows sinus rhythm, rate 64   Recent Labs: 04/29/2017: BUN 15; Creatinine, Ser 0.90; Hemoglobin 12.2; Potassium 3.8; Sodium 143    Lipid Panel  No results found for: CHOL, TRIG, HDL, CHOLHDL, VLDL, LDLCALC, LDLDIRECT   Wt Readings from Last 3 Encounters:  06/07/17 177 lb (80.3 kg)  04/29/17 175 lb (79.4 kg)  03/30/17 175 lb (79.4 kg)      Other studies Reviewed: Additional studies/ records that were reviewed today include: TTE 08/27/15  Review of the above records today demonstrates:  - Left ventricle: The cavity size was normal. Wall thickness was   increased in a pattern of mild LVH. Systolic function was   vigorous. The estimated ejection fraction was in the range of 65%   to 70%. Wall motion was normal; there were no regional wall   motion abnormalities. Doppler parameters are consistent with   abnormal left ventricular relaxation (grade 1 diastolic   dysfunction). The E/e&' ratio is <8, suggesting normal LV filling   pressure. - Aortic valve: Sclerosis without stenosis. Transvalvular velocity   was minimally increased. Mean gradient (S): 8 mm Hg. Peak   gradient (S): 17 mm Hg. - Left atrium: The atrium was normal in size. - Atrial septum: No defect or patent foramen ovale was identified. - Tricuspid valve: There was trivial regurgitation. - Pulmonic valve: Poorly visualized. Mild pulmonic stenosis. Peak   gradient of 32 mmHg. - Pulmonary arteries: Poorly visualized. PA peak pressure: 11 mm Hg   (S). - Inferior vena cava: The vessel was normal in size. The    respirophasic diameter changes were in the normal range (= 50%),   consistent with normal central venous pressure.   ASSESSMENT AND PLAN:  1.  Persistent atrial fibrillation: Currently on Eliquis and diltiazem.  He was initially put on flecainide, but had QRS widening and a left bundle branch pattern.  Flecainide was since stopped.  He is not return to atrial fibrillation.  He is done well without major complaint.  No changes.  This patients CHA2DS2-VASc Score and unadjusted Ischemic Stroke Rate (% per year) is equal to 3.2 % stroke rate/year from a score of 3  Above score calculated as 1 point each if present [CHF, HTN, DM, Vascular=MI/PAD/Aortic Plaque, Age if 65-74, or Male] Above score calculated as 2 points  each if present [Age > 75, or Stroke/TIA/TE]  2.  Hypertension: well-controlled.  No changes.  3.  Hyperlipidemia: Continue simvastatin  Current medicines are reviewed at length with the patient today.   The patient does not have concerns regarding his medicines.  The following changes were made today: none  Labs/ tests ordered today include:  Orders Placed This Encounter  Procedures  . EKG 12-Lead     Disposition:   FU with Jadan Rouillard 6 months  Signed, Bashar Milam Meredith [name], MD  06/07/2017 9:03 AM     [facility] [address] [place] [address] [phone] (office) [phone] (fax)

## 2017-06-07 NOTE — Patient Instructions (Signed)
Medication Instructions:  Your physician recommends that you continue on your current medications as directed. Please refer to the Current Medication list given to you today.  If you need a refill on your cardiac medications before your next appointment, please call your pharmacy.   Labwork: None ordered  Testing/Procedures: None ordered  Follow-Up: Your physician wants you to follow-up in: 6 months with Dr. Camnitz.  You will receive a reminder letter in the mail two months in advance. If you don't receive a letter, please call our office to schedule the follow-up appointment.  Thank you for choosing [facility]!!   Lennox Leikam, RN [phone]         

## 2017-06-17 ENCOUNTER — Other Ambulatory Visit: Payer: Self-pay | Admitting: Surgery

## 2017-06-17 DIAGNOSIS — C182 Malignant neoplasm of ascending colon: Secondary | ICD-10-CM

## 2017-06-17 DIAGNOSIS — Z7901 Long term (current) use of anticoagulants: Secondary | ICD-10-CM | POA: Diagnosis not present

## 2017-06-22 ENCOUNTER — Telehealth: Payer: Self-pay

## 2017-06-22 NOTE — Telephone Encounter (Signed)
   [facility] Pre-operative Risk Assessment    Request for surgical clearance:  1. What type of surgery is being performed? Colectomy  2. When is this surgery scheduled? TBD  3. What type of clearance is required (medical clearance vs. Pharmacy clearance to hold med vs. Both)? Both  4. Are there any medications that need to be held prior to surgery and how long? Needs instructions from Dr. Curt Bears as to how the pt should hold Eliquis preoperatively    5. Practice name and name of physician performing surgery? [facility], Dr. Alphonsa Overall  6. What is your office phone and fax number? Phone:[phone] Fax:[phone]  7. Anesthesia type (None, local, MAC, general) ? General    Mendel Ryder 06/22/2017, 4:17 PM  _________________________________________________________________   (provider comments below)

## 2017-06-23 NOTE — Telephone Encounter (Signed)
Pt takes Eliquis for afib with CHADS2VASc score of 3 (age x2, HTN). Pt underwent DCCV on 04/29/17 and has completed 30 days of uninterrupted anticoagulation. Renal function is normal. Ok to hold Eliquis 2-3 days prior to procedure as required.

## 2017-06-23 NOTE — Telephone Encounter (Signed)
   Primary Cardiologist: Will Meredith [name], MD  Chart reviewed as part of pre-operative protocol coverage. You have seen this patient on 06/07/17. Please provide clearance. Will route pharmacy to review anticoagulation.     [place], [place] 06/23/2017, 3:13 PM

## 2017-06-28 NOTE — Telephone Encounter (Signed)
   Primary Cardiologist: Will Meredith [name], MD  Chart reviewed as part of pre-operative protocol coverage. Patient was contacted 06/28/2017 in reference to pre-operative risk assessment for pending surgery as outlined below.  Brian Horn was last seen on 06/07/17 by 06/07/17.  Since that day, Brian Horn has done well. He has not felt any afib. No chest discomfort or dyspnea. He is very active, > 4 Mets.   Therefore, based on ACC/AHA guidelines, the patient would be at acceptable risk for the planned procedure without further cardiovascular testing.   See attached pharmacy recommendations for hold anticoagulation.   I will route this recommendation to the requesting party via Epic fax function and remove from pre-op pool.  Please call with questions.  Daune Perch, NP 06/28/2017, 4:05 PM

## 2017-06-29 ENCOUNTER — Ambulatory Visit
Admission: RE | Admit: 2017-06-29 | Discharge: 2017-06-29 | Disposition: A | Discharge status: Home / Self Care | Payer: Medicare Other | Source: Ambulatory Visit | Attending: Surgery | Admitting: Surgery

## 2017-06-29 DIAGNOSIS — C182 Malignant neoplasm of ascending colon: Secondary | ICD-10-CM

## 2017-06-29 DIAGNOSIS — C189 Malignant neoplasm of colon, unspecified: Secondary | ICD-10-CM | POA: Diagnosis not present

## 2017-06-29 MED ORDER — IOPAMIDOL (ISOVUE-300) INJECTION 61%
100.0000 mL | Freq: Once | INTRAVENOUS | Status: AC | PRN
  Administered 2017-06-29: 100 mL via INTRAVENOUS

## 2017-06-30 ENCOUNTER — Ambulatory Visit: Payer: Self-pay | Admitting: Surgery

## 2017-07-01 ENCOUNTER — Ambulatory Visit: Payer: Medicare Other | Admitting: Cardiology

## 2017-07-07 NOTE — Patient Instructions (Addendum)
Brian Horn  07/07/2017   Your procedure is scheduled on: 07-11-17   Report to [facility] Main  Entrance Report to Admitting at 1200 PM    Call this number if you have problems the morning of surgery [phone]   Remember: DRINK 2 PRESURGERY ENSURE DRINKS THE NIGHT BEFORE SURGERY AT 1000 PM AND 1 PRESURGERY DRINK THE DAY OF THE PROCEDURE 3 HOURS PRIOR TO SCHEDULED SURGERY. NO SOLIDS AFTER MIDNIGHT THE DAY PRIOR TO THE SURGERY. NOTHING BY MOUTH EXCEPT CLEAR LIQUIDS UNTIL THREE HOURS PRIOR TO SCHEDULED SURGERY. PLEASE FINISH PRESURGERY ENSURE DRINK PER SURGEON ORDER 3 HOURS PRIOR TO SCHEDULED SURGERY TIME WHICH NEEDS TO BE COMPLETED AT 11:00 AM.  Please consume a Clear Liquid Diet The Day of Prep  CLEAR LIQUID DIET   Foods Allowed                                                                     Foods Excluded  Coffee and tea, regular and decaf                             liquids that you cannot  Plain Jell-O in any flavor                                             see through such as: Fruit ices (not with fruit pulp)                                     milk, soups, orange juice  Iced Popsicles                                    All solid food Carbonated beverages, regular and diet                                    Cranberry, grape and apple juices Sports drinks like Gatorade Lightly seasoned clear broth or consume(fat free) Sugar, honey syrup  Sample Menu Breakfast                                Lunch                                     Supper Cranberry juice                    Beef broth                            Chicken broth Jell-O  Grape juice                           Apple juice Coffee or tea                        Jell-O                                      Popsicle                                                Coffee or tea                        Coffee or  tea  _____________________________________________________________________    Take these medicines the morning of surgery with A SIP OF WATER:Lansoprazole (Prevacid)                                You may not have any metal on your body including hair pins and              piercings  Do not wear jewelry, lotions, powders or deodorant             Men may shave face and neck.   Do not bring valuables to the hospital. [place].  Contacts, dentures or bridgework may not be worn into surgery.  Leave suitcase in the car. After surgery it may be brought to your room.                 Please read over the following fact sheets you were given: _____________________________________________________________________           [facility] - Preparing for Surgery Before surgery, you can play an important role.  Because skin is not sterile, your skin needs to be as free of germs as possible.  You can reduce the number of germs on your skin by washing with CHG (chlorahexidine gluconate) soap before surgery.  CHG is an antiseptic cleaner which kills germs and bonds with the skin to continue killing germs even after washing. Please DO NOT use if you have an allergy to CHG or antibacterial soaps.  If your skin becomes reddened/irritated stop using the CHG and inform your nurse when you arrive at Short Stay. Do not shave (including legs and underarms) for at least 48 hours prior to the first CHG shower.  You may shave your face/neck. Please follow these instructions carefully:  1.  Shower with CHG Soap the night before surgery and the  morning of Surgery.  2.  If you choose to wash your hair, wash your hair first as usual with your  normal  shampoo.  3.  After you shampoo, rinse your hair and body thoroughly to remove the  shampoo.                           4.  Use CHG as you would any other liquid soap.  You can apply  chg directly  to the skin and wash                        Gently with a scrungie or clean washcloth.  5.  Apply the CHG Soap to your body ONLY FROM THE NECK DOWN.   Do not use on face/ open                           Wound or open sores. Avoid contact with eyes, ears mouth and genitals (private parts).                       Wash face,  Genitals (private parts) with your normal soap.             6.  Wash thoroughly, paying special attention to the area where your surgery  will be performed.  7.  Thoroughly rinse your body with warm water from the neck down.  8.  DO NOT shower/wash with your normal soap after using and rinsing off  the CHG Soap.                9.  Pat yourself dry with a clean towel.            10.  Wear clean pajamas.            11.  Place clean sheets on your bed the night of your first shower and do not  sleep with pets. Day of Surgery : Do not apply any lotions/deodorants the morning of surgery.  Please wear clean clothes to the hospital/surgery center.  FAILURE TO FOLLOW THESE INSTRUCTIONS MAY RESULT IN THE CANCELLATION OF YOUR SURGERY PATIENT SIGNATURE_________________________________  NURSE SIGNATURE__________________________________  ________________________________________________________________________  WHAT IS A BLOOD TRANSFUSION? Blood Transfusion Information  A transfusion is the replacement of blood or some of its parts. Blood is made up of multiple cells which provide different functions.  Red blood cells carry oxygen and are used for blood loss replacement.  White blood cells fight against infection.  Platelets control bleeding.  Plasma helps clot blood.  Other blood products are available for specialized needs, such as hemophilia or other clotting disorders. BEFORE THE TRANSFUSION  Who gives blood for transfusions?   Healthy volunteers who are fully evaluated to make sure their blood is safe. This is blood bank blood. Transfusion therapy is the safest it has ever been in the practice of  medicine. Before blood is taken from a donor, a complete history is taken to make sure that person has no history of diseases nor engages in risky social behavior (examples are intravenous drug use or sexual activity with multiple partners). The donor's travel history is screened to minimize risk of transmitting infections, such as malaria. The donated blood is tested for signs of infectious diseases, such as HIV and hepatitis. The blood is then tested to be sure it is compatible with you in order to minimize the chance of a transfusion reaction. If you or a relative donates blood, this is often done in anticipation of surgery and is not appropriate for emergency situations. It takes many days to process the donated blood. RISKS AND COMPLICATIONS Although transfusion therapy is very safe and saves many lives, the main dangers of transfusion include:   Getting an infectious disease.  Developing a transfusion reaction. This is an allergic reaction to something in the blood you were given. Every precaution  is taken to prevent this. The decision to have a blood transfusion has been considered carefully by your caregiver before blood is given. Blood is not given unless the benefits outweigh the risks. AFTER THE TRANSFUSION  Right after receiving a blood transfusion, you will usually feel much better and more energetic. This is especially true if your red blood cells have gotten low (anemic). The transfusion raises the level of the red blood cells which carry oxygen, and this usually causes an energy increase.  The nurse administering the transfusion will monitor you carefully for complications. HOME CARE INSTRUCTIONS  No special instructions are needed after a transfusion. You may find your energy is better. Speak with your caregiver about any limitations on activity for underlying diseases you may have. SEEK MEDICAL CARE IF:   Your condition is not improving after your transfusion.  You develop  redness or irritation at the intravenous (IV) site. SEEK IMMEDIATE MEDICAL CARE IF:  Any of the following symptoms occur over the next 12 hours:  Shaking chills.  You have a temperature by mouth above 102 F (38.9 C), not controlled by medicine.  Chest, back, or muscle pain.  People around you feel you are not acting correctly or are confused.  Shortness of breath or difficulty breathing.  Dizziness and fainting.  You get a rash or develop hives.  You have a decrease in urine output.  Your urine turns a dark color or changes to pink, red, or brown. Any of the following symptoms occur over the next 10 days:  You have a temperature by mouth above 102 F (38.9 C), not controlled by medicine.  Shortness of breath.  Weakness after normal activity.  The white part of the eye turns yellow (jaundice).  You have a decrease in the amount of urine or are urinating less often.  Your urine turns a dark color or changes to pink, red, or brown. Document Released: 04/09/2000 Document Revised: 07/05/2011 Document Reviewed: 11/27/2007 [facility] Patient Information 2014 [place], [place].  _______________________________________________________________________

## 2017-07-08 ENCOUNTER — Encounter (HOSPITAL_COMMUNITY)
Admission: RE | Admit: 2017-07-08 | Discharge: 2017-07-08 | Disposition: A | Discharge status: Home / Self Care | Payer: Medicare Other | Source: Ambulatory Visit | Attending: Surgery | Admitting: Surgery

## 2017-07-08 ENCOUNTER — Other Ambulatory Visit: Payer: Self-pay

## 2017-07-08 ENCOUNTER — Encounter (HOSPITAL_COMMUNITY): Payer: Self-pay

## 2017-07-08 ENCOUNTER — Encounter (INDEPENDENT_AMBULATORY_CARE_PROVIDER_SITE_OTHER): Payer: Self-pay

## 2017-07-08 LAB — COMPREHENSIVE METABOLIC PANEL
ALT: 15 U/L — AB (ref 17–63)
AST: 19 U/L (ref 15–41)
Albumin: 4.3 g/dL (ref 3.5–5.0)
Alkaline Phosphatase: 76 U/L (ref 38–126)
Anion gap: 10 (ref 5–15)
BILIRUBIN TOTAL: 0.6 mg/dL (ref 0.3–1.2)
BUN: 18 mg/dL (ref 6–20)
CALCIUM: 9.6 mg/dL (ref 8.9–10.3)
CHLORIDE: 107 mmol/L (ref 101–111)
CO2: 24 mmol/L (ref 22–32)
CREATININE: 1.03 mg/dL (ref 0.61–1.24)
Glucose, Bld: 104 mg/dL — ABNORMAL HIGH (ref 65–99)
Potassium: 5 mmol/L (ref 3.5–5.1)
Sodium: 141 mmol/L (ref 135–145)
TOTAL PROTEIN: 7.4 g/dL (ref 6.5–8.1)

## 2017-07-08 LAB — CBC WITH DIFFERENTIAL/PLATELET
Basophils Absolute: 0 10*3/uL (ref 0.0–0.1)
Basophils Relative: 0 %
Eosinophils Absolute: 0.1 10*3/uL (ref 0.0–0.7)
Eosinophils Relative: 1 %
HCT: 37.4 % — ABNORMAL LOW (ref 39.0–52.0)
Hemoglobin: 11.9 g/dL — ABNORMAL LOW (ref 13.0–17.0)
LYMPHS ABS: 1.8 10*3/uL (ref 0.7–4.0)
LYMPHS PCT: 22 %
MCH: 29.8 pg (ref 26.0–34.0)
MCHC: 31.8 g/dL (ref 30.0–36.0)
MCV: 93.5 fL (ref 78.0–100.0)
MONO ABS: 1 10*3/uL (ref 0.1–1.0)
Monocytes Relative: 11 %
Neutro Abs: 5.5 10*3/uL (ref 1.7–7.7)
Neutrophils Relative %: 66 %
Platelets: 231 10*3/uL (ref 150–400)
RBC: 4 MIL/uL — AB (ref 4.22–5.81)
RDW: 14.6 % (ref 11.5–15.5)
WBC: 8.4 10*3/uL (ref 4.0–10.5)

## 2017-07-08 LAB — HEMOGLOBIN A1C
HEMOGLOBIN A1C: 5.6 % (ref 4.8–5.6)
MEAN PLASMA GLUCOSE: 114.02 mg/dL

## 2017-07-08 LAB — ABO/RH: ABO/RH(D): O POS

## 2017-07-08 NOTE — Progress Notes (Signed)
06-22-17 (Epic) Cardiac Clearance  06-07-17 (Epic) EKG  05-17-17 (Epic) Stress  08-27-15 (Epic) ECHO

## 2017-07-10 NOTE — H&P (Signed)
Brian Horn  Location: [facility] Surgery Patient #: [id number] DOB: 1935-12-04 Married / Language: English / Race: White Male  History of Present Illness   The patient is a 82 year old male who presents with a complaint of colon cancer.  The PCP is Dr. Electa Sniff  The patient was referred by Dr. Estell Harpin.  He is accompanied by his wife.  He had guiac positive stools and his ferritin had sagged. His last colonosocpy was about 10 years ago. He has had no other GI symptoms. He underwent a colonscopy on 05/30/2017 which showed a cecal tumor. On biopsy ([id number]) this is an invasive adenoca. He also had a upper endoscopy at the same time. He has had an open cholecystectomy - 1980's and an open appendectomy - 1970's. I have repaired a right inguinal hernia on him in the past.  I reviewed with the patient the findings and need for colon surgery. I discussed both surgical and non surgical options. I discussed the role of laparoscopic and open surgery in colon surgery. I reviewed the risks of surgery, including, but not limited to, infection, bleeding, nerve injury, anastomotic leaks, and possibility of colostomy. I went over the preoperative mechanical and antibiotic bowel prep for colon surgery and provided prescriptions for these. I provided the patient literature on colon surgery. I tried to answer all questions for the patient.  Plan: 1) Cardiac clearance and instructions about stopping Eliquis from Dr. Curt Bears, 2) CT scan of abdomen and pelvis, 3) Laparoscopic right colectomy - we will give him the mechanical and antibiotic prep today.  Past Medical History: 1. A fib Cardioversion - Dr. Jerilynn Mages. Croitoru - 04/29/2017 Followed by Dr. Curt Bears 2. Anticoagulated on Eliquis 3. Lap right inguinal hernia - 08/06/2011 - D. Leveta Wahab at [facility] 4. HTN 5. Hemochromatosis. He gets phlebotomies every 6 months. 6. Hyperlipidemia. 7.  Nephrolithiasis. But has not had trouble in some time. 8. Left groin mass - lipoma vs node - benign 9. Small hernia at RLQ incision site. 39. 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 Prostatic hypertrophy Prostatic urethral lift - 03/15/2017 - [place]  Social History: Married. Wife, Wilburn Cornelia, with patient. 3 children: Kieth Brightly 46 yo, Mickel Baas 80 yo, and Richardson Landry (?) 36 yo. His son has an increased PSA and seeing urologist. He still works. 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 Owens Shark, [place]; 06/17/2017 8:57 AM) Lipitor *ANTIHYPERLIPIDEMICS*  Allergies Reconciled   Medication History (Tanisha A. Owens Shark, RMA; 06/17/2017 9:00 AM) Finasteride (5MG  Tablet, Oral) Active. Prevacid (15MG  Capsule DR, Oral daily) Active. Coenzyme Q10 (100MG  Capsule, Oral) Active. Cinnamon (500MG  Capsule, Oral daily) Active. Multiple Vitamins (Oral daily) Active. Eliquis (5MG  Tablet, Oral) Active. Simvastatin (10MG  Tablet, Oral) Active. Latanoprost (0.005% Solution, Ophthalmic) Active. Benazepril HCl (20MG  Tablet, Oral) Active. Medications Reconciled  Vitals (Tanisha A. Brown RMA; 06/17/2017 8:57 AM) 06/17/2017 8:57 AM Weight: 179.6 lb Height: 72in Body Surface Area: 2.04 m Body Mass Index: 24.36 kg/m  Temp.: 98.19F  Pulse: 88 (Regular)  BP: 136/82 (Sitting, Left Arm, Standard)   Physical Exam  General: WN WM alert and generally healthy appearing. Skin: Inspection and palpation of the skin unremarkable.  Eyes: Conjunctivae white, pupils equal. Face, ears, nose, mouth, and throat: Face - normal. Normal ears and nose. Lips and teeth normal.  Neck: Supple. No mass. Trachea midline. No thyroid mass. Lymph Nodes: No supraclavicular or cervical adenopathy.  No axillary adenopathy.  No inguinal adenopathy.  Lungs: Normal respiratory effort. Clear to auscultation and symmetric breath  sounds. Cardiovascular: Regular rate and rythm. Normal auscultation of the heart. 2/6 systolic murmur  Abdomen: Soft. No mass. Liver and spleen not palpable. No tenderness. Normal bowel sounds.  RUQ scar and RLQ scar with hernia Rectal: Not done.  Musculoskeletal/extremities: Normal gait.  Good strength and ROM in upper and lower extremities.   Neurologic: Grossly intact to motor and sensory function. Psychiatric: Has normal mood and affect. Judgement and insight appear normal.   Assessment & Plan  1.  CANCER OF RIGHT COLON (C18.2)  Plan:   1)    2)  Addendum Note(Jatziri Goffredo H. Madalynn Pickelsimer MD; 06/30/2017 8:41 PM) CT scan 06/29/2017 - 2.2 cm polypoid soft tissue mass in ascending colon, hernia in RLQ, right lower kidney pole stone - no metastatic disease CEA - 2.5 on 06/17/2017 Cardiac clearance by Dr. Curt Bears    3) Laparoscopic right colectomy  2.  ANTICOAGULATED (Z79.01)  Hold the Eliquis for 4 days. 3. A fib  Cardioversion - Dr. Jerilynn Mages. Croitoru - 04/29/2017  Followed by Dr. Curt Bears 4.  Abdominal wall hernia - RLQ  Will evaluate what I can do at the time of surgery 5.  Right lower pole kidney stone 6. Lap right inguinal hernia - 08/06/2011 - D. Alsace Dowd at [facility] 7. HTN 8. Hemochromatosis. He gets phlebotomies every 6 months. 9. Hyperlipidemia. 10. Left groin mass - lipoma vs node - benign  11. 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 Prostatic hypertrophy Prostatic urethral lift - 03/15/2017 - Marcelle Overlie, MD, [facility] Pager: [phone] Office phone:  [phone]

## 2017-07-11 ENCOUNTER — Inpatient Hospital Stay (HOSPITAL_COMMUNITY)
Admission: RE | Admit: 2017-07-11 | Discharge: 2017-07-15 | DRG: 331 | Disposition: A | Discharge status: Home / Self Care | Payer: Medicare Other | Source: Ambulatory Visit | Attending: Surgery | Admitting: Surgery

## 2017-07-11 ENCOUNTER — Encounter (HOSPITAL_COMMUNITY): Payer: Self-pay | Admitting: Certified Registered"

## 2017-07-11 ENCOUNTER — Inpatient Hospital Stay (HOSPITAL_COMMUNITY): Payer: Medicare Other | Admitting: Certified Registered"

## 2017-07-11 ENCOUNTER — Telehealth (HOSPITAL_COMMUNITY): Payer: Self-pay | Admitting: *Deleted

## 2017-07-11 ENCOUNTER — Other Ambulatory Visit: Payer: Self-pay

## 2017-07-11 ENCOUNTER — Encounter (HOSPITAL_COMMUNITY)
Admission: RE | Disposition: A | Discharge status: Home / Self Care | Payer: Self-pay | Source: Ambulatory Visit | Attending: Surgery

## 2017-07-11 DIAGNOSIS — N200 Calculus of kidney: Secondary | ICD-10-CM | POA: Diagnosis present

## 2017-07-11 DIAGNOSIS — E785 Hyperlipidemia, unspecified: Secondary | ICD-10-CM | POA: Diagnosis present

## 2017-07-11 DIAGNOSIS — Z9842 Cataract extraction status, left eye: Secondary | ICD-10-CM | POA: Diagnosis not present

## 2017-07-11 DIAGNOSIS — K439 Ventral hernia without obstruction or gangrene: Secondary | ICD-10-CM | POA: Diagnosis present

## 2017-07-11 DIAGNOSIS — H409 Unspecified glaucoma: Secondary | ICD-10-CM | POA: Diagnosis present

## 2017-07-11 DIAGNOSIS — N400 Enlarged prostate without lower urinary tract symptoms: Secondary | ICD-10-CM | POA: Diagnosis present

## 2017-07-11 DIAGNOSIS — Z9841 Cataract extraction status, right eye: Secondary | ICD-10-CM | POA: Diagnosis not present

## 2017-07-11 DIAGNOSIS — C182 Malignant neoplasm of ascending colon: Secondary | ICD-10-CM | POA: Diagnosis present

## 2017-07-11 DIAGNOSIS — I10 Essential (primary) hypertension: Secondary | ICD-10-CM | POA: Diagnosis present

## 2017-07-11 DIAGNOSIS — I481 Persistent atrial fibrillation: Secondary | ICD-10-CM | POA: Diagnosis not present

## 2017-07-11 DIAGNOSIS — C180 Malignant neoplasm of cecum: Secondary | ICD-10-CM | POA: Diagnosis not present

## 2017-07-11 DIAGNOSIS — Z7901 Long term (current) use of anticoagulants: Secondary | ICD-10-CM | POA: Diagnosis not present

## 2017-07-11 DIAGNOSIS — I4891 Unspecified atrial fibrillation: Secondary | ICD-10-CM | POA: Diagnosis present

## 2017-07-11 DIAGNOSIS — Z87891 Personal history of nicotine dependence: Secondary | ICD-10-CM | POA: Diagnosis not present

## 2017-07-11 DIAGNOSIS — Z79899 Other long term (current) drug therapy: Secondary | ICD-10-CM

## 2017-07-11 DIAGNOSIS — C189 Malignant neoplasm of colon, unspecified: Secondary | ICD-10-CM | POA: Diagnosis not present

## 2017-07-11 HISTORY — PX: LAPAROSCOPIC RIGHT COLECTOMY: SHX5925

## 2017-07-11 LAB — TYPE AND SCREEN
ABO/RH(D): O POS
Antibody Screen: NEGATIVE

## 2017-07-11 SURGERY — COLECTOMY, RIGHT, LAPAROSCOPIC
Anesthesia: General | Site: Abdomen | Laterality: Right

## 2017-07-11 MED ORDER — CHLORHEXIDINE GLUCONATE 4 % EX LIQD: 60.0000 mL | Freq: Once | CUTANEOUS | Status: DC

## 2017-07-11 MED ORDER — BUPIVACAINE HCL (PF) 0.25 % IJ SOLN
INTRAMUSCULAR | Status: DC | PRN
  Administered 2017-07-11: 30 mL

## 2017-07-11 MED ORDER — KCL IN DEXTROSE-NACL 20-5-0.45 MEQ/L-%-% IV SOLN
INTRAVENOUS | Status: DC
  Administered 2017-07-11 – 2017-07-12 (×3): via INTRAVENOUS
  Administered 2017-07-12 – 2017-07-13 (×2): 1000 mL via INTRAVENOUS
  Administered 2017-07-13: 05:00:00 via INTRAVENOUS
  Filled 2017-07-11 (×7): qty 1000

## 2017-07-11 MED ORDER — SUGAMMADEX SODIUM 200 MG/2ML IV SOLN
INTRAVENOUS | Status: DC | PRN
  Administered 2017-07-11: 175 mg via INTRAVENOUS

## 2017-07-11 MED ORDER — ALVIMOPAN 12 MG PO CAPS
12.0000 mg | ORAL_CAPSULE | Freq: Two times a day (BID) | ORAL | Status: DC
  Administered 2017-07-12 – 2017-07-13 (×4): 12 mg via ORAL
  Filled 2017-07-11 (×4): qty 1

## 2017-07-11 MED ORDER — LACTATED RINGERS IR SOLN
Status: DC | PRN
  Administered 2017-07-11: 1000 mL

## 2017-07-11 MED ORDER — LIDOCAINE 2% (20 MG/ML) 5 ML SYRINGE
INTRAMUSCULAR | Status: DC | PRN
  Administered 2017-07-11: 100 mg via INTRAVENOUS

## 2017-07-11 MED ORDER — BUPIVACAINE LIPOSOME 1.3 % IJ SUSP
20.0000 mL | Freq: Once | INTRAMUSCULAR | Status: AC
  Administered 2017-07-11: 20 mL
  Filled 2017-07-11: qty 20

## 2017-07-11 MED ORDER — FENTANYL CITRATE (PF) 100 MCG/2ML IJ SOLN
INTRAMUSCULAR | Status: AC
  Filled 2017-07-11: qty 2

## 2017-07-11 MED ORDER — HYDROCODONE-ACETAMINOPHEN 5-325 MG PO TABS: 1.0000 | ORAL_TABLET | ORAL | Status: DC | PRN

## 2017-07-11 MED ORDER — MIDAZOLAM HCL 2 MG/2ML IJ SOLN
INTRAMUSCULAR | Status: AC
  Filled 2017-07-11: qty 2

## 2017-07-11 MED ORDER — GABAPENTIN 300 MG PO CAPS
300.0000 mg | ORAL_CAPSULE | ORAL | Status: AC
  Administered 2017-07-11: 300 mg via ORAL
  Filled 2017-07-11: qty 1

## 2017-07-11 MED ORDER — SUGAMMADEX SODIUM 200 MG/2ML IV SOLN
INTRAVENOUS | Status: AC
  Filled 2017-07-11: qty 2

## 2017-07-11 MED ORDER — DEXAMETHASONE SODIUM PHOSPHATE 10 MG/ML IJ SOLN
INTRAMUSCULAR | Status: DC | PRN
  Administered 2017-07-11: 10 mg via INTRAVENOUS

## 2017-07-11 MED ORDER — ONDANSETRON HCL 4 MG/2ML IJ SOLN: 4.0000 mg | Freq: Four times a day (QID) | INTRAMUSCULAR | Status: DC | PRN

## 2017-07-11 MED ORDER — OXYCODONE HCL 5 MG PO TABS: 5.0000 mg | ORAL_TABLET | Freq: Once | ORAL | Status: DC | PRN

## 2017-07-11 MED ORDER — DEXAMETHASONE SODIUM PHOSPHATE 10 MG/ML IJ SOLN
INTRAMUSCULAR | Status: AC
  Filled 2017-07-11: qty 1

## 2017-07-11 MED ORDER — ACETAMINOPHEN 500 MG PO TABS
1000.0000 mg | ORAL_TABLET | ORAL | Status: AC
  Administered 2017-07-11: 1000 mg via ORAL
  Filled 2017-07-11: qty 2

## 2017-07-11 MED ORDER — KETAMINE HCL 10 MG/ML IJ SOLN
INTRAMUSCULAR | Status: DC | PRN
  Administered 2017-07-11: 40 mg via INTRAVENOUS

## 2017-07-11 MED ORDER — CEFOTETAN DISODIUM-DEXTROSE 2-2.08 GM-%(50ML) IV SOLR
2.0000 g | INTRAVENOUS | Status: AC
  Administered 2017-07-11: 2 g via INTRAVENOUS
  Filled 2017-07-11: qty 50

## 2017-07-11 MED ORDER — FENTANYL CITRATE (PF) 250 MCG/5ML IJ SOLN
INTRAMUSCULAR | Status: AC
  Filled 2017-07-11: qty 5

## 2017-07-11 MED ORDER — BUPIVACAINE HCL (PF) 0.25 % IJ SOLN
INTRAMUSCULAR | Status: AC
  Filled 2017-07-11: qty 30

## 2017-07-11 MED ORDER — 0.9 % SODIUM CHLORIDE (POUR BTL) OPTIME
TOPICAL | Status: DC | PRN
  Administered 2017-07-11: 2000 mL

## 2017-07-11 MED ORDER — PROPOFOL 10 MG/ML IV BOLUS
INTRAVENOUS | Status: DC | PRN
  Administered 2017-07-11: 160 mg via INTRAVENOUS

## 2017-07-11 MED ORDER — ONDANSETRON HCL 4 MG/2ML IJ SOLN
INTRAMUSCULAR | Status: DC | PRN
  Administered 2017-07-11: 4 mg via INTRAVENOUS

## 2017-07-11 MED ORDER — ONDANSETRON HCL 4 MG/2ML IJ SOLN
4.0000 mg | Freq: Once | INTRAMUSCULAR | Status: DC | PRN
Start: 2017-07-11 — End: 2017-07-11

## 2017-07-11 MED ORDER — LIDOCAINE 2% (20 MG/ML) 5 ML SYRINGE
INTRAMUSCULAR | Status: AC
  Filled 2017-07-11: qty 5

## 2017-07-11 MED ORDER — ONDANSETRON HCL 4 MG/2ML IJ SOLN
INTRAMUSCULAR | Status: AC
Start: 2017-07-11 — End: 2017-07-11
  Filled 2017-07-11: qty 2

## 2017-07-11 MED ORDER — HEPARIN SODIUM (PORCINE) 5000 UNIT/ML IJ SOLN
5000.0000 [IU] | Freq: Three times a day (TID) | INTRAMUSCULAR | Status: DC
  Administered 2017-07-11 – 2017-07-15 (×10): 5000 [IU] via SUBCUTANEOUS
  Filled 2017-07-11 (×10): qty 1

## 2017-07-11 MED ORDER — PHENYLEPHRINE 40 MCG/ML (10ML) SYRINGE FOR IV PUSH (FOR BLOOD PRESSURE SUPPORT)
PREFILLED_SYRINGE | INTRAVENOUS | Status: AC
  Filled 2017-07-11: qty 10

## 2017-07-11 MED ORDER — ACETAMINOPHEN 500 MG PO TABS
1000.0000 mg | ORAL_TABLET | Freq: Four times a day (QID) | ORAL | Status: AC
  Administered 2017-07-12 (×2): 1000 mg via ORAL
  Filled 2017-07-11 (×2): qty 2

## 2017-07-11 MED ORDER — ROCURONIUM BROMIDE 10 MG/ML (PF) SYRINGE
PREFILLED_SYRINGE | INTRAVENOUS | Status: AC
Start: 2017-07-11 — End: 2017-07-11
  Filled 2017-07-11: qty 5

## 2017-07-11 MED ORDER — FENTANYL CITRATE (PF) 100 MCG/2ML IJ SOLN
25.0000 ug | INTRAMUSCULAR | Status: DC | PRN
  Administered 2017-07-11: 50 ug via INTRAVENOUS
  Administered 2017-07-11: 25 ug via INTRAVENOUS
  Administered 2017-07-11: 50 ug via INTRAVENOUS
  Administered 2017-07-11: 25 ug via INTRAVENOUS

## 2017-07-11 MED ORDER — ALVIMOPAN 12 MG PO CAPS
12.0000 mg | ORAL_CAPSULE | ORAL | Status: AC
  Administered 2017-07-11: 12 mg via ORAL
  Filled 2017-07-11: qty 1

## 2017-07-11 MED ORDER — FENTANYL CITRATE (PF) 100 MCG/2ML IJ SOLN
INTRAMUSCULAR | Status: DC | PRN
  Administered 2017-07-11 (×5): 50 ug via INTRAVENOUS

## 2017-07-11 MED ORDER — ROCURONIUM BROMIDE 10 MG/ML (PF) SYRINGE
PREFILLED_SYRINGE | INTRAVENOUS | Status: DC | PRN
  Administered 2017-07-11: 80 mg via INTRAVENOUS
  Administered 2017-07-11 (×2): 10 mg via INTRAVENOUS

## 2017-07-11 MED ORDER — HEPARIN SODIUM (PORCINE) 5000 UNIT/ML IJ SOLN
5000.0000 [IU] | Freq: Once | INTRAMUSCULAR | Status: AC
  Administered 2017-07-11: 5000 [IU] via SUBCUTANEOUS
  Filled 2017-07-11: qty 1

## 2017-07-11 MED ORDER — PROPOFOL 10 MG/ML IV BOLUS
INTRAVENOUS | Status: AC
Start: 2017-07-11 — End: 2017-07-11
  Filled 2017-07-11: qty 20

## 2017-07-11 MED ORDER — SACCHAROMYCES BOULARDII 250 MG PO CAPS
250.0000 mg | ORAL_CAPSULE | Freq: Two times a day (BID) | ORAL | Status: DC
  Administered 2017-07-12 – 2017-07-15 (×7): 250 mg via ORAL
  Filled 2017-07-11 (×7): qty 1

## 2017-07-11 MED ORDER — MORPHINE SULFATE (PF) 2 MG/ML IV SOLN
1.0000 mg | INTRAVENOUS | Status: DC | PRN
  Filled 2017-07-11: qty 1

## 2017-07-11 MED ORDER — ONDANSETRON HCL 4 MG PO TABS: 4.0000 mg | ORAL_TABLET | Freq: Four times a day (QID) | ORAL | Status: DC | PRN

## 2017-07-11 MED ORDER — PHENYLEPHRINE 40 MCG/ML (10ML) SYRINGE FOR IV PUSH (FOR BLOOD PRESSURE SUPPORT)
PREFILLED_SYRINGE | INTRAVENOUS | Status: DC | PRN
  Administered 2017-07-11: 80 ug via INTRAVENOUS

## 2017-07-11 MED ORDER — OXYCODONE HCL 5 MG/5ML PO SOLN
5.0000 mg | Freq: Once | ORAL | Status: DC | PRN
  Filled 2017-07-11: qty 5

## 2017-07-11 MED ORDER — LACTATED RINGERS IV SOLN
INTRAVENOUS | Status: DC
  Administered 2017-07-11 (×2): via INTRAVENOUS

## 2017-07-11 SURGICAL SUPPLY — 77 items
ADH SKN CLS APL DERMABOND .7 (GAUZE/BANDAGES/DRESSINGS) ×1
APPLIER CLIP 5 13 M/L LIGAMAX5 (MISCELLANEOUS)
APPLIER CLIP ROT 10 11.4 M/L (STAPLE)
APR CLP MED LRG 11.4X10 (STAPLE)
APR CLP MED LRG 5 ANG JAW (MISCELLANEOUS)
BLADE EXTENDED COATED 6.5IN (ELECTRODE) IMPLANT
CABLE HIGH FREQUENCY MONO STRZ (ELECTRODE) ×2 IMPLANT
CATH COUDE 5CC RIBBED (CATHETERS) IMPLANT
CATH RIBBED COUDE 5CC (CATHETERS) ×2
CELLS DAT CNTRL 66122 CELL SVR (MISCELLANEOUS) IMPLANT
CLIP APPLIE 5 13 M/L LIGAMAX5 (MISCELLANEOUS) IMPLANT
CLIP APPLIE ROT 10 11.4 M/L (STAPLE) IMPLANT
COUNTER NEEDLE 20 DBL MAG RED (NEEDLE) ×2 IMPLANT
COVER MAYO STAND STRL (DRAPES) ×6 IMPLANT
COVER SURGICAL LIGHT HANDLE (MISCELLANEOUS) ×2 IMPLANT
DERMABOND ADVANCED (GAUZE/BANDAGES/DRESSINGS) ×1
DERMABOND ADVANCED .7 DNX12 (GAUZE/BANDAGES/DRESSINGS) IMPLANT
DEVICE TROCAR PUNCTURE CLOSURE (ENDOMECHANICALS) ×1 IMPLANT
DISSECTOR BLUNT TIP ENDO 5MM (MISCELLANEOUS) IMPLANT
DRAIN CHANNEL 19F RND (DRAIN) IMPLANT
DRAPE LAPAROSCOPIC ABDOMINAL (DRAPES) ×1 IMPLANT
DRAPE UTILITY XL STRL (DRAPES) ×3 IMPLANT
DRSG OPSITE POSTOP 4X6 (GAUZE/BANDAGES/DRESSINGS) ×1 IMPLANT
ELECT PENCIL ROCKER SW 15FT (MISCELLANEOUS) ×3 IMPLANT
ELECT REM PT RETURN 15FT ADLT (MISCELLANEOUS) ×2 IMPLANT
EVACUATOR SILICONE 100CC (DRAIN) IMPLANT
EXTRT SYSTEM ALEXIS 17CM (MISCELLANEOUS)
GLOVE SURG SIGNA 7.5 PF LTX (GLOVE) ×6 IMPLANT
GOWN STRL REUS W/ TWL LRG LVL3 (GOWN DISPOSABLE) IMPLANT
GOWN STRL REUS W/TWL LRG LVL3 (GOWN DISPOSABLE) ×6
GOWN STRL REUS W/TWL XL LVL3 (GOWN DISPOSABLE) ×10 IMPLANT
HOLDER FOLEY CATH W/STRAP (MISCELLANEOUS) ×2 IMPLANT
LEGGING LITHOTOMY PAIR STRL (DRAPES) ×1 IMPLANT
LUBRICANT JELLY K Y 4OZ (MISCELLANEOUS) IMPLANT
NDL HYPO 25X1 1.5 SAFETY (NEEDLE) ×1 IMPLANT
NEEDLE HYPO 25X1 1.5 SAFETY (NEEDLE) ×2 IMPLANT
PACK COLON (CUSTOM PROCEDURE TRAY) ×2 IMPLANT
PAD POSITIONING PINK XL (MISCELLANEOUS) ×2 IMPLANT
PORT LAP GEL ALEXIS MED 5-9CM (MISCELLANEOUS) ×1 IMPLANT
POSITIONER SURGICAL ARM (MISCELLANEOUS) IMPLANT
RELOAD PROXIMATE 75MM BLUE (ENDOMECHANICALS) ×4 IMPLANT
RELOAD STAPLE 75 3.8 BLU REG (ENDOMECHANICALS) IMPLANT
RETRACTOR WND ALEXIS 18 MED (MISCELLANEOUS) IMPLANT
RTRCTR WOUND ALEXIS 18CM MED (MISCELLANEOUS)
SEALER TISSUE X1 CVD JAW (INSTRUMENTS) IMPLANT
SET IRRIG TUBING LAPAROSCOPIC (IRRIGATION / IRRIGATOR) ×2 IMPLANT
SHEARS HARMONIC ACE PLUS 36CM (ENDOMECHANICALS) ×1 IMPLANT
SLEEVE ADV FIXATION 5X100MM (TROCAR) ×2 IMPLANT
SLEEVE XCEL OPT CAN 5 100 (ENDOMECHANICALS) ×1 IMPLANT
STAPLER PROXIMATE 75MM BLUE (STAPLE) ×1 IMPLANT
STAPLER VISISTAT 35W (STAPLE) ×1 IMPLANT
SUT ETHILON 3 0 PS 1 (SUTURE) IMPLANT
SUT MNCRL AB 4-0 PS2 18 (SUTURE) ×2 IMPLANT
SUT NOVA NAB DX-16 0-1 5-0 T12 (SUTURE) ×1 IMPLANT
SUT PDS AB 1 CT1 27 (SUTURE) ×2 IMPLANT
SUT PDS AB 1 CTX 36 (SUTURE) ×1 IMPLANT
SUT PDS AB 1 TP1 96 (SUTURE) IMPLANT
SUT PROLENE 2 0 KS (SUTURE) IMPLANT
SUT PROLENE 2 0 SH DA (SUTURE) IMPLANT
SUT SILK 2 0 (SUTURE)
SUT SILK 2 0 SH CR/8 (SUTURE) ×2 IMPLANT
SUT SILK 2-0 18XBRD TIE 12 (SUTURE) ×1 IMPLANT
SUT SILK 3 0 (SUTURE)
SUT SILK 3 0 SH CR/8 (SUTURE) ×5 IMPLANT
SUT SILK 3-0 18XBRD TIE 12 (SUTURE) ×1 IMPLANT
SUT VIC AB 3-0 SH 18 (SUTURE) IMPLANT
SYS LAPSCP GELPORT 120MM (MISCELLANEOUS)
SYSTEM CONTND EXTRCTN KII BLLN (MISCELLANEOUS) IMPLANT
SYSTEM LAPSCP GELPORT 120MM (MISCELLANEOUS) IMPLANT
TOWEL OR NON WOVEN STRL DISP B (DISPOSABLE) IMPLANT
TRAY FOLEY W/METER SILVER 16FR (SET/KITS/TRAYS/PACK) ×1 IMPLANT
TROCAR ADV FIXATION 11X100MM (TROCAR) IMPLANT
TROCAR ADV FIXATION 12X100MM (TROCAR) IMPLANT
TROCAR ADV FIXATION 5X100MM (TROCAR) ×2 IMPLANT
TROCAR BLADELESS OPT 5 100 (ENDOMECHANICALS) ×2 IMPLANT
TROCAR XCEL BLUNT TIP 100MML (ENDOMECHANICALS) IMPLANT
TUBING INSUF HEATED (TUBING) ×2 IMPLANT

## 2017-07-11 NOTE — Interval H&P Note (Signed)
History and Physical Interval Note:  07/11/2017 1:01 PM  Brian Horn  has presented today for surgery, with the diagnosis of RIGHT COLON CANCER  The various methods of treatment have been discussed with the patient and family.   Wife and daughter are at the bedside.  After consideration of risks, benefits and other options for treatment, the patient has consented to  Procedure(s): LAPAROSCOPIC RIGHT COLECTOMY, POSSIBLE REPAIR RIGHT LOWER ABDOMINAL HERNIA (Right) as a surgical intervention .  The patient's history has been reviewed, patient examined, no change in status, stable for surgery.  I have reviewed the patient's chart and labs.  Questions were answered to the patient's satisfaction.     Shann Medal

## 2017-07-11 NOTE — Anesthesia Postprocedure Evaluation (Signed)
Anesthesia Post Note  Patient: Brian Horn  Procedure(s) Performed: LAPAROSCOPIC RIGHT COLECTOMY, REPAIR LAPAROSCOPIC  ABDOMINAL HERNIA (Right Abdomen)     Patient location during evaluation: PACU Anesthesia Type: General Level of consciousness: awake Pain management: pain level controlled Vital Signs Assessment: post-procedure vital signs reviewed and stable Respiratory status: spontaneous breathing Cardiovascular status: stable Anesthetic complications: no    Last Vitals:  Vitals:   07/11/17 1645 07/11/17 1700  BP: (!) 144/76 (!) 142/73  Pulse: 71 73  Resp: (!) 22 (!) 24  Temp:    SpO2: 99% 98%    Last Pain:  Vitals:   07/11/17 1645  TempSrc:   PainSc: 10-Worst pain ever                 Perfecto Purdy

## 2017-07-11 NOTE — Anesthesia Preprocedure Evaluation (Addendum)
Anesthesia Evaluation  Patient identified by MRN, date of birth, ID band Patient awake    Reviewed: Allergy & Precautions, NPO status , Patient's Chart, lab work & pertinent test results  Airway Mallampati: II  TM Distance: >3 FB Neck ROM: Full    Dental  (+) Teeth Intact, Dental Advisory Given   Pulmonary former smoker,    breath sounds clear to auscultation- rhonchi       Cardiovascular hypertension, Pt. on medications + dysrhythmias Atrial Fibrillation + Valvular Problems/Murmurs  Rhythm:Regular Rate:Normal  '19 Exercise Stress - Blood pressure demonstrated a normal response to exercise. ETT on flecainide. Baseline ECG LBBB. No QRS widening or arrhythmia. Non diagnostic for ischemia. Did have 2 mm ST depression in inferior lateral leads, but BBB and max HR 86% make non diagnostic for ischemia  '17 TTE - Mild LVH. EF 65% to 70%. Grade 1 diastolic   dysfunction. AV sclerosis without stenosis. Trivial TR.  Mild pulmonic stenosis. Peak  gradient of 32 mmHg.   Neuro/Psych Tinnitus negative psych ROS   GI/Hepatic Neg liver ROS, GERD  Medicated and Controlled,  Endo/Other  negative endocrine ROS  Renal/GU Nephrolithaisis  negative genitourinary   Musculoskeletal negative musculoskeletal ROS (+)   Abdominal   Peds  Hematology  (+) Blood dyscrasia, anemia , Hematochromatosis   Anesthesia Other Findings   Reproductive/Obstetrics                             Anesthesia Physical  Anesthesia Plan  ASA: III  Anesthesia Plan: General   Post-op Pain Management:    Induction: Intravenous  PONV Risk Score and Plan: 4 or greater and Treatment may vary due to age or medical condition, Ondansetron and Dexamethasone  Airway Management Planned: Oral ETT  Additional Equipment: None  Intra-op Plan:   Post-operative Plan: Extubation in OR  Informed Consent: I have reviewed the patients History and  Physical, chart, labs and discussed the procedure including the risks, benefits and alternatives for the proposed anesthesia with the patient or authorized representative who has indicated his/her understanding and acceptance.   Dental advisory given  Plan Discussed with: Anesthesiologist and CRNA  Anesthesia Plan Comments:         Anesthesia Quick Evaluation

## 2017-07-11 NOTE — Transfer of Care (Signed)
Immediate Anesthesia Transfer of Care Note  Patient: JMARI PELC  Procedure(s) Performed: LAPAROSCOPIC RIGHT COLECTOMY, REPAIR LAPAROSCOPIC  ABDOMINAL HERNIA (Right Abdomen)  Patient Location: PACU  Anesthesia Type:General  Level of Consciousness: awake, alert  and oriented  Airway & Oxygen Therapy: Patient Spontanous Breathing and Patient connected to face mask oxygen  Post-op Assessment: Report given to RN and Post -op Vital signs reviewed and stable  Post vital signs: Reviewed and stable  Last Vitals:  Vitals:   07/11/17 1131 07/11/17 1620  BP: (!) 160/96   Pulse: 75   Resp:    Temp:  (P) 37.1 C  SpO2: 100%     Last Pain:  Vitals:   07/11/17 1128  TempSrc: Oral      Patients Stated Pain Goal: 4 (27/61/84 8592)  Complications: No apparent anesthesia complications

## 2017-07-11 NOTE — Anesthesia Procedure Notes (Signed)
Procedure Name: Intubation Date/Time: 07/11/2017 1:14 PM Performed by: Rushton Early D, CRNA Pre-anesthesia Checklist: Patient identified, Emergency Drugs available, Suction available and Patient being monitored Patient Re-evaluated:Patient Re-evaluated prior to induction Oxygen Delivery Method: Circle system utilized Preoxygenation: Pre-oxygenation with 100% oxygen Induction Type: IV induction Ventilation: Mask ventilation without difficulty Laryngoscope Size: Mac and 4 Grade View: Grade I Tube type: Oral Tube size: 7.5 mm Number of attempts: 1 Airway Equipment and Method: Stylet Placement Confirmation: ETT inserted through vocal cords under direct vision,  positive ETCO2 and breath sounds checked- equal and bilateral Secured at: 22 cm Tube secured with: Tape Dental Injury: Teeth and Oropharynx as per pre-operative assessment

## 2017-07-11 NOTE — Op Note (Signed)
07/11/2017  4:10 PM  PATIENT:  Brian Horn, 82 y.o., male, MRN: [id number]  PREOP DIAGNOSIS:  RIGHT COLON CANCER, right lower quadrant abdominal wall hernia  POSTOP DIAGNOSIS:   RIGHT COLON CANCER, right lower quadrant abdominal wall hernia  PROCEDURE:   Procedure(s):  Enterolysis of adhesions (20 minutes),  LAPAROSCOPIC RIGHT COLECTOMY, Laparoscopic REPAIR ABDOMINAL HERNIA (2 sutures)  SURGEON:   Alphonsa Overall, M.D.  Terrence DupontEvlyn Courier, M.D.  ANESTHESIA:   general  Anesthesiologist: Audry Pili, MD CRNA: Victoriano Lain, CRNA; Williford, Peggy D, CRNA  General  EBL:  minimal  ml  BLOOD ADMINISTERED: none  DRAINS: none   LOCAL MEDICATIONS USED:   20 cc of Exparel + 30 cc of 1/4% marcaine  SPECIMEN:   Right colon  COUNTS CORRECT:  YES  INDICATIONS FOR PROCEDURE:  Brian Horn is a 82 y.o. (DOB: 08-30-1935) white male whose primary care physician is [name], MD and comes for resection of a right colon cancer and possible repair of the right lower quadrant hernia (from an old open appendectomy incision).   The indications and risks of the surgery were explained to the patient.  The risks include, but are not limited to, infection, bleeding, and nerve injury.  PROCEDURE:  The patient was taken to room #2 and underwent a general anesthetic.  A Foley catheter was placed.  We tried the regular straight 16 [place] Foley, but had to use a an 53 [place] coud catheter to get in the bladder.  He has abdomen shaved and prepped with ChloraPrep and sterilely draped.   A timeout was held and the surgical checklist run.   I accessed the abdominal cavity with a 5 mm trocar in the left upper quadrant.  I placed 3 additional trochars, a 5 mm trocar to the left of the umbilicus, a 5 mm trocar on the left lower quadrant, and a 5 mm trocar in the right lower quadrant.   The abdomen was explored.  The patient had omentum stuck in a hernia in the right lower quadrant  abdominal wall.  He also had omentum stuck in the gallbladder bed in the right upper quadrant.  I spent about 20 minutes doing enteral lysis to free up the omentum in the right lower quadrant hernia and free up the omentum on the undersurface of the right lobe of the liver.   I then carried out an abdominal exploration.  The right and left lobes of the liver were unremarkable with no evidence of nodularity or metastatic disease.  The stomach was unremarkable.  The small bowel that I can see was unremarkable.  There was no external evidence of the tumor on the colon.  He did have thickened scar in his right lower quadrant involving the cecum and terminal ileum I think related to his prior appendectomy.   I then freed up the right colon.  I divided the attachments at the right pelvic brim and along the right colonic gutter.  This swung right colon up into the left side.  I identified the duodenum.  I did not see the ureter on the right but felt this was posterior to my dissection.   When I had the right colon freed up enough, I then made a right transverse incision just above the umbilicus.  I divided the anterior and posterior rectus fascia and into the abdominal cavity.  I delivered the right colon and cecum into this wound using the wound protector.  I then  divided the terminal ileum with a GIA 70 stapler.  I divided the right transverse colon with a GIA 70 stapler.  I divided the mesentery of the right colon with Harmonic scalpel and 2-0 silk sutures.  I tried to take this dissection down to the base of the mesentery.  I then divided and sent off the right colon.  I did open the colon at the bedside and he had an approximate 3 cm raised lesion consistent with his primary cancer.   I then carried out a anastomosis between the terminal ileum and the right transverse colon.  I made an enterotomy in both the colon and small bowel I did the anastomosis with the 70 Ethicon GIA stapler.  I closed the common  channel with interrupted 3-0 silk sutures.  I did a buttress suture and clot suture with 3-0 silk sutures.  The colon was then dropped back into the abdominal cavity.  I did not close the mesentery.  There was no bleeding.   I then addressed the right lower quadrant abdominal wall hernia.  Because of the colon surgery, I was hesitant to use any permanent mesh.  I decided to close the hernia with two interrupted #1 Novafil sutures using the Endo Close to place the sutures.  This closed the primary defect I will cover him during the early postoperative..   I then closed the right upper quadrant incision in layers using #1 PDS suture.  I closed the posterior rectus fascia and the anterior rectus fascia.  I irrigated the wound with saline.  I infiltrated 20 cc of local anesthetic as a block in the wound.  I closed the skin with 4-0 monocryl.   I then re-laparoscoped the patient.  There was not bleeding or area of concern from the surgery.   Sponge and needle count were correct at the end of the case.  The patient tolerated procedure well was transported to the recovery room in good condition.   I have a surgeon as a first assist to retract, expose, and assist on this difficult operation.   Alphonsa Overall, MD, [facility] Pager: [phone] Office phone:  [phone]

## 2017-07-12 ENCOUNTER — Encounter (HOSPITAL_COMMUNITY): Payer: Self-pay | Admitting: Surgery

## 2017-07-12 LAB — CBC
HCT: 32 % — ABNORMAL LOW (ref 39.0–52.0)
HEMOGLOBIN: 10.1 g/dL — AB (ref 13.0–17.0)
MCH: 29.7 pg (ref 26.0–34.0)
MCHC: 31.6 g/dL (ref 30.0–36.0)
MCV: 94.1 fL (ref 78.0–100.0)
Platelets: 220 10*3/uL (ref 150–400)
RBC: 3.4 MIL/uL — ABNORMAL LOW (ref 4.22–5.81)
RDW: 14.9 % (ref 11.5–15.5)
WBC: 13.1 10*3/uL — AB (ref 4.0–10.5)

## 2017-07-12 LAB — BASIC METABOLIC PANEL
ANION GAP: 7 (ref 5–15)
BUN: 10 mg/dL (ref 6–20)
CO2: 24 mmol/L (ref 22–32)
Calcium: 9 mg/dL (ref 8.9–10.3)
Chloride: 106 mmol/L (ref 101–111)
Creatinine, Ser: 0.98 mg/dL (ref 0.61–1.24)
GFR calc Af Amer: >60 mL/min (ref >60)
GLUCOSE: 188 mg/dL — AB (ref 65–99)
Potassium: 4.6 mmol/L (ref 3.5–5.1)
SODIUM: 137 mmol/L (ref 135–145)

## 2017-07-12 MED ORDER — KETOROLAC TROMETHAMINE 15 MG/ML IJ SOLN: 15.0000 mg | Freq: Four times a day (QID) | INTRAMUSCULAR | Status: DC | PRN

## 2017-07-12 MED ORDER — ACETAMINOPHEN 325 MG PO TABS
650.0000 mg | ORAL_TABLET | Freq: Once | ORAL | Status: AC
  Administered 2017-07-12: 650 mg via ORAL
  Filled 2017-07-12: qty 2

## 2017-07-12 NOTE — Progress Notes (Signed)
[place] [facility] Office:  [phone] General Surgery Progress Note   LOS: 1 day  POD -  1 Day Post-Op  Chief Complaint: Colon cancer  Assessment and Plan: 1.  LAPAROSCOPIC RIGHT COLECTOMY, REPAIR LAPAROSCOPIC LLQ ABDOMINAL HERNIA - 07/11/2017 - D. Aden Sek  Final pathology pending  Looks good - will start sips from the floor  2.  ANTICOAGULATED (Z79.01)             Eliquis on hold  3. A fib        Cardioversion - Dr. Jerilynn Mages. Croitoru - 04/29/2017       Followed by Dr. Curt Bears 4.  Right lower pole kidney stone 5. HTN 6. Hemochromatosis. He gets phlebotomies every 6 months. 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 Prostatic hypertrophy  Prostatic urethral lift - 03/15/2017 - [place] 9.  Foley     I had to put a coude tipped catheter in the OR yesterday.  Will leave foley till tomorrow AM. 10.  DVT prophylaxis - SQ Heparin   Active Problems:   Cancer of right colon (HCC)   Subjective:  Doing well. Sore in the right abdomen.  Wife in room with patient.  Objective:   Vitals:   07/12/17 0550 07/12/17 1000  BP: (!) 164/84 (!) 161/83  Pulse: 75 71  Resp: 18 18  Temp: 98.7 F (37.1 C) 98.4 F (36.9 C)  SpO2: 100% 97%     Intake/Output from previous day:  03/18 0701 - 03/19 0700 In: 3350 [I.V.:3350] Out: 1375 [Urine:1325; Blood:50]  Intake/Output this shift:  Total I/O In: -  Out: 900 [Urine:900]   Physical Exam:   General: WN older WM who is alert and oriented.    HEENT: Normal. Pupils equal. .   Lungs: Clear   Abdomen: Soft.  Has BS.   Wound: Clean   Lab Results:    Recent Labs    07/12/17 0426  WBC 13.1*  HGB 10.1*  HCT 32.0*  PLT 220    BMET   Recent Labs    07/12/17 0426  NA 137  K 4.6  CL 106  CO2 24  GLUCOSE 188*  BUN 10  CREATININE 0.98  CALCIUM 9.0    PT/INR  No results for input(s): LABPROT, INR in the last 72 hours.  ABG  No results for input(s): PHART, HCO3 in  the last 72 hours.  Invalid input(s): PCO2, PO2   Studies/Results:  No results found.   Anti-infectives:   Anti-infectives (From admission, onward)   Start     Dose/Rate Route Frequency Ordered Stop   07/11/17 1126  cefoTEtan in Dextrose 5% (CEFOTAN) IVPB 2 g     2 g Intravenous On call to O.R. 07/11/17 1126 07/11/17 1315      Brian Overall, Brian Horn, FACS Pager: [place] [facility] Office: [phone] 07/12/2017

## 2017-07-12 NOTE — Progress Notes (Signed)
Patient was dangled at side of bed for few minutes but refused to ambulate, he sated he will ambulated tomorrow

## 2017-07-13 LAB — CBC WITH DIFFERENTIAL/PLATELET
Basophils Absolute: 0 10*3/uL (ref 0.0–0.1)
Basophils Relative: 0 %
EOS PCT: 0 %
Eosinophils Absolute: 0 10*3/uL (ref 0.0–0.7)
HCT: 31.7 % — ABNORMAL LOW (ref 39.0–52.0)
Hemoglobin: 10 g/dL — ABNORMAL LOW (ref 13.0–17.0)
Lymphocytes Relative: 13 %
Lymphs Abs: 1.8 10*3/uL (ref 0.7–4.0)
MCH: 29.8 pg (ref 26.0–34.0)
MCHC: 31.5 g/dL (ref 30.0–36.0)
MCV: 94.3 fL (ref 78.0–100.0)
Monocytes Absolute: 1.5 10*3/uL — ABNORMAL HIGH (ref 0.1–1.0)
Monocytes Relative: 11 %
Neutro Abs: 10.2 10*3/uL — ABNORMAL HIGH (ref 1.7–7.7)
Neutrophils Relative %: 76 %
PLATELETS: 220 10*3/uL (ref 150–400)
RBC: 3.36 MIL/uL — AB (ref 4.22–5.81)
RDW: 15 % (ref 11.5–15.5)
WBC: 13.6 10*3/uL — AB (ref 4.0–10.5)

## 2017-07-13 LAB — BASIC METABOLIC PANEL
Anion gap: 8 (ref 5–15)
BUN: 9 mg/dL (ref 6–20)
CO2: 24 mmol/L (ref 22–32)
Calcium: 8.8 mg/dL — ABNORMAL LOW (ref 8.9–10.3)
Chloride: 108 mmol/L (ref 101–111)
Creatinine, Ser: 0.86 mg/dL (ref 0.61–1.24)
GFR calc Af Amer: >60 mL/min (ref >60)
Glucose, Bld: 130 mg/dL — ABNORMAL HIGH (ref 65–99)
POTASSIUM: 3.9 mmol/L (ref 3.5–5.1)
SODIUM: 140 mmol/L (ref 135–145)

## 2017-07-13 MED ORDER — FINASTERIDE 5 MG PO TABS
2.5000 mg | ORAL_TABLET | Freq: Every day | ORAL | Status: DC
  Administered 2017-07-13 – 2017-07-15 (×3): 2.5 mg via ORAL
  Filled 2017-07-13 (×3): qty 0.5

## 2017-07-13 MED ORDER — LATANOPROST 0.005 % OP SOLN
1.0000 [drp] | Freq: Every day | OPHTHALMIC | Status: DC
  Administered 2017-07-13 – 2017-07-14 (×2): 1 [drp] via OPHTHALMIC
  Filled 2017-07-13: qty 2.5

## 2017-07-13 MED ORDER — BENAZEPRIL HCL 10 MG PO TABS
20.0000 mg | ORAL_TABLET | Freq: Every day | ORAL | Status: DC
  Administered 2017-07-13 – 2017-07-14 (×2): 20 mg via ORAL
  Filled 2017-07-13 (×2): qty 2

## 2017-07-13 MED ORDER — ACETAMINOPHEN 325 MG PO TABS
650.0000 mg | ORAL_TABLET | Freq: Three times a day (TID) | ORAL | Status: DC
Start: 2017-07-13 — End: 2017-07-15
  Administered 2017-07-13 – 2017-07-15 (×7): 650 mg via ORAL
  Filled 2017-07-13 (×7): qty 2

## 2017-07-13 NOTE — Progress Notes (Addendum)
[place] [facility] Office:  [phone] General Surgery Progress Note   LOS: 2 days  POD -  2 Days Post-Op  Chief Complaint: Colon cancer  Assessment and Plan: 1.  LAPAROSCOPIC RIGHT COLECTOMY, REPAIR LAPAROSCOPIC LLQ ABDOMINAL HERNIA - 07/11/2017 - D. [facility]  Final pathology pending  To advance to clear liquids  2.  ANTICOAGULATED (Z79.01)             Eliquis on hold  3. A fib        Cardioversion - Dr. Jerilynn Mages. Croitoru - 04/29/2017       Followed by Dr. Curt Bears 4.  Right lower pole kidney stone 5. HTN 6. Hemochromatosis. He gets phlebotomies every 6 months. 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 Prostatic hypertrophy  Prostatic urethral lift - 03/15/2017 - [place]    Restart Proscar 9.  Foley    Removed - he has voided 10.  DVT prophylaxis - SQ Heparin   Active Problems:   Cancer of right colon (HCC)   Subjective:  Doing well. Sore.  Passed flatus.  Tolerated sips from the floor. He found out that his son has prostate cancer.  Objective:   Vitals:   07/12/17 2220 07/13/17 0527  BP: (!) 149/89 (!) 162/84  Pulse: 76 67  Resp: 18 18  Temp: 98.3 F (36.8 C) 98.4 F (36.9 C)  SpO2: 99% 97%     Intake/Output from previous day:  03/19 0701 - 03/20 0700 In: 240 [P.O.:240] Out: 4900 [Urine:4900]  Intake/Output this shift:  No intake/output data recorded.   Physical Exam:   General: WN older WM who is alert and oriented.    HEENT: Normal. Pupils equal. .   Lungs: Clear   Abdomen: Soft.  Has BS.   Wound: Clean   Lab Results:    Recent Labs    07/12/17 0426 07/13/17 0526  WBC 13.1* 13.6*  HGB 10.1* 10.0*  HCT 32.0* 31.7*  PLT 220 220    BMET   Recent Labs    07/12/17 0426 07/13/17 0526  NA 137 140  K 4.6 3.9  CL 106 108  CO2 24 24  GLUCOSE 188* 130*  BUN 10 9  CREATININE 0.98 0.86  CALCIUM 9.0 8.8*    PT/INR  No results for input(s): LABPROT, INR in the last 72  hours.  ABG  No results for input(s): PHART, HCO3 in the last 72 hours.  Invalid input(s): PCO2, PO2   Studies/Results:  No results found.   Anti-infectives:   Anti-infectives (From admission, onward)   Start     Dose/Rate Route Frequency Ordered Stop   07/11/17 1126  cefoTEtan in Dextrose 5% (CEFOTAN) IVPB 2 g     2 g Intravenous On call to O.R. 07/11/17 1126 07/11/17 1315      Alphonsa Overall, MD, FACS Pager: [place] [facility] Office: [phone] 07/13/2017

## 2017-07-14 NOTE — Care Management Important Message (Signed)
Important Message  Patient Details  Name: Brian Horn MRN: [id number] Date of Birth: [birth year]   Medicare Important Message Given:  Yes    Kerin Salen 07/14/2017, 10:17 AMImportant Message  Patient Details  Name: Brian Horn MRN: [id number] Date of Birth: December 22, 1935   Medicare Important Message Given:  Yes    Kerin Salen 07/14/2017, 10:17 AM

## 2017-07-14 NOTE — Progress Notes (Signed)
[place] [facility] Office:  [phone] General Surgery Progress Note   LOS: 3 days  POD -  3 Days Post-Op  Chief Complaint: Colon cancer  Assessment and Plan: 1.  LAPAROSCOPIC RIGHT COLECTOMY, REPAIR LAPAROSCOPIC LLQ ABDOMINAL HERNIA - 07/11/2017 - D. [facility]  Final pathology pending  To advance to full liquids  2.  ANTICOAGULATED (Z79.01)             Eliquis on hold  3. A fib        Cardioversion - Dr. Jerilynn Mages. Croitoru - 04/29/2017       Followed by Dr. Curt Bears 4.  Right lower pole kidney stone 5. HTN 6. Hemochromatosis. He gets phlebotomies every 6 months. 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 Prostatic hypertrophy  Prostatic urethral lift - 03/15/2017 - [place]    Restart Proscar 9.  DVT prophylaxis - SQ Heparin   Active Problems:   Cancer of right colon (HCC)   Subjective:  Doing well. Tolerated liquids well.  Doing a good job walking. He found out that his son has prostate cancer.  Objective:   Vitals:   07/13/17 2129 07/14/17 0536  BP: (!) 160/89 (!) 148/94  Pulse: 72 76  Resp: 18 18  Temp: 98.1 F (36.7 C) 98.9 F (37.2 C)  SpO2: 95% 94%     Intake/Output from previous day:  03/20 0701 - 03/21 0700 In: 2270 [P.O.:1370; I.V.:900] Out: 2650 [Urine:2650]  Intake/Output this shift:  No intake/output data recorded.   Physical Exam:   General: WN older WM who is alert and oriented.    HEENT: Normal. Pupils equal. .   Lungs: Clear   Abdomen: Soft.  Has BS.   Wound: Clean   Lab Results:    Recent Labs    07/12/17 0426 07/13/17 0526  WBC 13.1* 13.6*  HGB 10.1* 10.0*  HCT 32.0* 31.7*  PLT 220 220    BMET   Recent Labs    07/12/17 0426 07/13/17 0526  NA 137 140  K 4.6 3.9  CL 106 108  CO2 24 24  GLUCOSE 188* 130*  BUN 10 9  CREATININE 0.98 0.86  CALCIUM 9.0 8.8*    PT/INR  No results for input(s): LABPROT, INR in the last 72 hours.  ABG  No results for input(s):  PHART, HCO3 in the last 72 hours.  Invalid input(s): PCO2, PO2   Studies/Results:  No results found.   Anti-infectives:   Anti-infectives (From admission, onward)   Start     Dose/Rate Route Frequency Ordered Stop   07/11/17 1126  cefoTEtan in Dextrose 5% (CEFOTAN) IVPB 2 g     2 g Intravenous On call to O.R. 07/11/17 1126 07/11/17 1315      Alphonsa Overall, MD, FACS Pager: [place] [facility] Office: [phone] 07/14/2017

## 2017-07-15 NOTE — Discharge Instructions (Signed)
[facility] - DISCHARGE INSTRUCTIONS TO PATIENT  Activity:  Driving - May drive in 3 or 4 days, if doing well   Lifting - No lifting more than 15 pounds for 3 weeks, then no limit  Wound Care:   May shower  Diet:  Eat light for a couple of days, then as tolerated  Follow up appointment:  Call Dr. Pollie Friar office [facility]) at [phone] for an appointment in 2 to 3 weeks.  Medications and dosages:  Resume your home medications.             You should resume your Eliquis  Call Dr. Lucia Gaskins or his office  [phone]) if you have:  Temperature greater than 100.4,  Persistent nausea and vomiting,  Severe uncontrolled pain,  Redness, tenderness, or signs of infection (pain, swelling, redness, odor or green/yellow discharge around the site),  Difficulty breathing, headache or visual disturbances,  Any other questions or concerns you may have after discharge.  In an emergency, call 911 or go to an Emergency Department at a nearby hospital.

## 2017-07-15 NOTE — Discharge Summary (Signed)
Physician Discharge Summary  Patient ID:  Brian Horn  MRN: [id number]  DOB/AGE: Sep 14, 1935 82 y.o.  Admit date: 07/11/2017 Discharge date: 07/15/2017  Discharge Diagnoses:  1.  Right colon cancer  Invasive moderately differentiated adenoca invading into submucosa, 2.5 cm, 0/17 nodes (T1, N0) 2.ANTICOAGULATED (Z79.01) Eliquis on hold - to restart when he goes home 3. A fib Cardioversion - Dr. Jerilynn Mages. Croitoru - 04/29/2017 Followed by Dr. Curt Bears 4. Right lower pole kidney stone 5. HTN 6. Hemochromatosis. He gets phlebotomies every 6 months. 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 Prostatic hypertrophy       Prostatic urethral lift - 03/15/2017 - [place]   Active Problems:   Cancer of right colon [facility])  Operation: Procedure(s):    LAPAROSCOPIC RIGHT COLECTOMY, REPAIR LAPAROSCOPIC  ABDOMINAL HERNIA on 07/11/2017  Discharged Condition: good  Hospital Course: Brian Horn is an 82 y.o. male whose primary care physician is [name], MD and who was admitted 07/11/2017 with a chief complaint of right colon cancer.  He had undergone a colonoscopy by Dr. Estell Harpin on 05/30/2017 and he found a tumor in the right colon which on biopsy showed adenoca.  He was brought to the operating room on 07/11/2017 and underwent  LAPAROSCOPIC RIGHT COLECTOMY, REPAIR LAPAROSCOPIC  ABDOMINAL HERNIA.   He was on Eliquis for A. Fib, this has been held for his hospitalization. Post op, he has done well. His foley was removed on the second post op day.  He was started on clear liquids and advanced. He is now 4 days post op.  He is tolerating his diet well.  He has had a BM.  He is ready to go home.  The discharge instructions were reviewed with the patient.  Consults: None  Significant Diagnostic Studies: Results for orders placed or performed during the hospital encounter of 50/27/74  Basic metabolic panel  Result  Value Ref Range   Sodium 137 135 - 145 mmol/L   Potassium 4.6 3.5 - 5.1 mmol/L   Chloride 106 101 - 111 mmol/L   CO2 24 22 - 32 mmol/L   Glucose, Bld 188 (H) 65 - 99 mg/dL   BUN 10 6 - 20 mg/dL   Creatinine, Ser 0.98 0.61 - 1.24 mg/dL   Calcium 9.0 8.9 - 10.3 mg/dL   GFR calc non Af Amer >60 >60 mL/min   GFR calc Af Amer >60 >60 mL/min   Anion gap 7 5 - 15  CBC  Result Value Ref Range   WBC 13.1 (H) 4.0 - 10.5 K/uL   RBC 3.40 (L) 4.22 - 5.81 MIL/uL   Hemoglobin 10.1 (L) 13.0 - 17.0 g/dL   HCT 32.0 (L) 39.0 - 52.0 %   MCV 94.1 78.0 - 100.0 fL   MCH 29.7 26.0 - 34.0 pg   MCHC 31.6 30.0 - 36.0 g/dL   RDW 14.9 11.5 - 15.5 %   Platelets 220 150 - 400 K/uL  CBC with Differential/Platelet  Result Value Ref Range   WBC 13.6 (H) 4.0 - 10.5 K/uL   RBC 3.36 (L) 4.22 - 5.81 MIL/uL   Hemoglobin 10.0 (L) 13.0 - 17.0 g/dL   HCT 31.7 (L) 39.0 - 52.0 %   MCV 94.3 78.0 - 100.0 fL   MCH 29.8 26.0 - 34.0 pg   MCHC 31.5 30.0 - 36.0 g/dL   RDW 15.0 11.5 - 15.5 %   Platelets 220 150 - 400 K/uL   Neutrophils Relative % 76 %  Neutro Abs 10.2 (H) 1.7 - 7.7 K/uL   Lymphocytes Relative 13 %   Lymphs Abs 1.8 0.7 - 4.0 K/uL   Monocytes Relative 11 %   Monocytes Absolute 1.5 (H) 0.1 - 1.0 K/uL   Eosinophils Relative 0 %   Eosinophils Absolute 0.0 0.0 - 0.7 K/uL   Basophils Relative 0 %   Basophils Absolute 0.0 0.0 - 0.1 K/uL  Basic metabolic panel  Result Value Ref Range   Sodium 140 135 - 145 mmol/L   Potassium 3.9 3.5 - 5.1 mmol/L   Chloride 108 101 - 111 mmol/L   CO2 24 22 - 32 mmol/L   Glucose, Bld 130 (H) 65 - 99 mg/dL   BUN 9 6 - 20 mg/dL   Creatinine, Ser 0.86 0.61 - 1.24 mg/dL   Calcium 8.8 (L) 8.9 - 10.3 mg/dL   GFR calc non Af Amer >60 >60 mL/min   GFR calc Af Amer >60 >60 mL/min   Anion gap 8 5 - 15    Ct Abdomen Pelvis W Contrast  Result Date: 06/30/2017 CLINICAL DATA:  Ascending colon cancer, for staging EXAM: CT ABDOMEN AND PELVIS WITH CONTRAST TECHNIQUE: Multidetector CT  imaging of the abdomen and pelvis was performed using the standard protocol following bolus administration of intravenous contrast. CONTRAST:  116mL ISOVUE-300 IOPAMIDOL (ISOVUE-300) INJECTION 61% Creatinine was obtained on site at [facility] at [address] Results: Creatinine 1.0 mg/dL. COMPARISON:  09/16/2005 FINDINGS: Lower chest: Mild subpleural reticulation in the bilateral lung bases. Hepatobiliary: Liver is within normal limits. Status post cholecystectomy. No intrahepatic or extrahepatic ductal dilatation. Pancreas: Within normal limits. Spleen: Within normal limits. Adrenals/Urinary Tract: Adrenal glands are within normal limits. 4.2 cm anterior left upper pole renal cyst (series 2/image 24). 5 mm medial right upper pole renal cyst (series 2/image 27). 2 mm 2 mm nonobstructing right lower pole renal calculus (series 2/image 36). No hydronephrosis. Bladder is within normal limits. Stomach/Bowel: Stomach is within normal limits. No evidence of bowel obstruction. Prior appendectomy. 2.2 x 1.0 cm eccentric polypoid soft tissue lesion along the cecum/ascending colon (series 2/image 44), likely corresponding to the patient's known colon cancer. No extension into the pericolonic soft tissues. Vascular/Lymphatic: No evidence of abdominal aortic aneurysm. Atherosclerotic calcifications of the abdominal aorta and branch vessels. No suspicious abdominopelvic lymphadenopathy. Specifically, no suspicious ileocolic lymphadenopathy. Reproductive: Prostatomegaly with enlargement of the central gland, suggesting BPH. Postsurgical changes related to urolift procedure. Dystrophic calcifications. Other: No abdominopelvic ascites. Small fat containing hernia along the lateral right lower abdominal wall (series 2/image 64). Musculoskeletal: Degenerative changes of the visualized thoracolumbar spine. IMPRESSION: 2.2 cm polypoid soft tissue lesion along the cecum/ascending colon, likely corresponding to the  patient's known colon cancer. No evidence of metastatic disease. Additional ancillary findings as above. Electronically Signed   By: Julian Hy M.D.   On: 06/30/2017 09:27    Discharge Exam:  Vitals:   07/14/17 2020 07/15/17 0534  BP: 135/83 (!) 142/79  Pulse: 71 67  Resp: 16 16  Temp: 98.7 F (37.1 C) 98.9 F (37.2 C)  SpO2: 95% 96%    General: WN WM who is alert and generally healthy appearing.  Lungs: Clear to auscultation and symmetric breath sounds. Heart:  RRR. No murmur or rub. Abdomen: Soft.  Normal bowel sounds. Incisions look good  Discharge Medications:   Allergies as of 07/15/2017      Reactions   Lipitor [atorvastatin Calcium] Other (See Comments)   cramping  Medication List    TAKE these medications   acetaminophen 500 MG tablet Commonly known as:  TYLENOL Take 500-1,000 mg by mouth every 6 (six) hours as needed (for pain.).   benazepril 20 MG tablet Commonly known as:  LOTENSIN Take 20 mg by mouth at bedtime.   Cinnamon 500 MG Tabs Take 500 mg by mouth at bedtime.   DILT-XR 180 MG 24 hr capsule Generic drug:  diltiazem Take 180 mg by mouth daily.   ELIQUIS 5 MG Tabs tablet Generic drug:  apixaban Take 5 mg by mouth 2 (two) times daily.   finasteride 5 MG tablet Commonly known as:  PROSCAR Take 2.5 mg by mouth at bedtime.   latanoprost 0.005 % ophthalmic solution Commonly known as:  XALATAN Place 1 drop into both eyes at bedtime.   multivitamin with minerals Tabs tablet Take 1 tablet by mouth daily.   PREVACID 15 MG capsule Generic drug:  lansoprazole Take 15 mg by mouth daily before breakfast.   simvastatin 10 MG tablet Commonly known as:  ZOCOR Take 10 mg by mouth at bedtime.   Vitamin D3 1000 units Caps Take 1,000 Units by mouth at bedtime.       Disposition: Discharge disposition: 01-Home or Self Care       Discharge Instructions    Diet - low sodium heart healthy   Complete by:  As directed    Increase  activity slowly   Complete by:  As directed          Signed: Alphonsa Overall, M.D., [facility] Office:  [phone]  07/15/2017, 9:57 AM

## 2017-08-03 DIAGNOSIS — H401133 Primary open-angle glaucoma, bilateral, severe stage: Secondary | ICD-10-CM | POA: Diagnosis not present

## 2017-09-20 DIAGNOSIS — H401133 Primary open-angle glaucoma, bilateral, severe stage: Secondary | ICD-10-CM | POA: Diagnosis not present

## 2017-10-14 DIAGNOSIS — Z85038 Personal history of other malignant neoplasm of large intestine: Secondary | ICD-10-CM | POA: Diagnosis not present

## 2017-10-14 DIAGNOSIS — K219 Gastro-esophageal reflux disease without esophagitis: Secondary | ICD-10-CM | POA: Diagnosis not present

## 2017-10-14 DIAGNOSIS — E7800 Pure hypercholesterolemia, unspecified: Secondary | ICD-10-CM | POA: Diagnosis not present

## 2017-10-14 DIAGNOSIS — I10 Essential (primary) hypertension: Secondary | ICD-10-CM | POA: Diagnosis not present

## 2017-10-14 DIAGNOSIS — N401 Enlarged prostate with lower urinary tract symptoms: Secondary | ICD-10-CM | POA: Diagnosis not present

## 2017-10-14 DIAGNOSIS — Z1389 Encounter for screening for other disorder: Secondary | ICD-10-CM | POA: Diagnosis not present

## 2017-10-14 DIAGNOSIS — N521 Erectile dysfunction due to diseases classified elsewhere: Secondary | ICD-10-CM | POA: Diagnosis not present

## 2017-10-14 DIAGNOSIS — I480 Paroxysmal atrial fibrillation: Secondary | ICD-10-CM | POA: Diagnosis not present

## 2017-10-14 DIAGNOSIS — Z0000 Encounter for general adult medical examination without abnormal findings: Secondary | ICD-10-CM | POA: Diagnosis not present

## 2017-10-14 DIAGNOSIS — D509 Iron deficiency anemia, unspecified: Secondary | ICD-10-CM | POA: Diagnosis not present

## 2017-11-14 DIAGNOSIS — D509 Iron deficiency anemia, unspecified: Secondary | ICD-10-CM | POA: Diagnosis not present

## 2017-11-18 DIAGNOSIS — N401 Enlarged prostate with lower urinary tract symptoms: Secondary | ICD-10-CM | POA: Diagnosis not present

## 2017-11-18 DIAGNOSIS — N5201 Erectile dysfunction due to arterial insufficiency: Secondary | ICD-10-CM | POA: Diagnosis not present

## 2017-11-18 DIAGNOSIS — R351 Nocturia: Secondary | ICD-10-CM | POA: Diagnosis not present

## 2017-11-21 ENCOUNTER — Telehealth: Payer: Self-pay | Admitting: Cardiology

## 2017-11-21 NOTE — Telephone Encounter (Signed)
Patient c/o Palpitations:  High priority if patient c/o lightheadedness, shortness of breath, or chest pain  1) How long have you had palpitations/irregular HR/ Afib? Are you having the symptoms now? Afib yesterday morning.Marland KitchenYes weak no engery  2) Are you currently experiencing lightheadedness, SOB or CP? SOB a little  3) Do you have a history of afib (atrial fibrillation) or irregular heart rhythm? Yes  4) Have you checked your BP or HR? (document readings if available): Yes  124/106 BP    121 HR  5) Are you experiencing any other symptoms? weakness

## 2017-11-21 NOTE — Telephone Encounter (Signed)
Pt is calling to let Sherri RN and Dr Curt Bears know that he went back into afib starting a little over 24 hours ago. Pt states that Sunday afternoon he had a lot of personal stress going on, then he felt his afib trigger thereafter. Pt states that he thinks that his afib was stress induced. Pt states that he has remained in afib since event on yesterday afternoon. Pt states that he took his HR and BP and they are as reported:  HR-120s irregular and BP-124/106. Pt reports that he feels NO CP, he has little SOB.  Pt reports that he feels fatigued and weak. Pt reports he has NO N/V, no diaphoresis, and no referred CP.  Pt reports he is lightheaded and dizzy. Pt does NOT feel pre-syncopal and has had NO syncopal episodes. Pt would like for Dr Curt Bears and Venida Jarvis RN to advise on what he should do. Pt reports he is taking all meds prescribed.  He also states the last time he went into afib was on this past January 2019.  Informed the pt that Dr Curt Bears and Venida Jarvis are both in clinic at this time, but I will route this message to them for further review and recommendation.  Informed the pt that either Sherri RN or a triage nurse will follow-up with him shortly thereafter, once recommendations received.  Pt verbalized understanding and agrees with this plan.

## 2017-11-21 NOTE — Telephone Encounter (Signed)
Been out of rhythm since late Saturday evening/early Sunday -- pt woke up Sunday morning in AFib.   Pt aware I will discuss with Dr. Curt Bears and call him back today.  (no AFib clinic appts today/tomorrow). Pt is agreeable to plan.

## 2017-11-21 NOTE — Telephone Encounter (Signed)
Pt reports back in rhythm since we spoke. Reports BP/HR: 2:15pm   107/66, HR 85 3:20pm    112/77, HR 78 4:40pm   119/75, HR 76 Pt is going to continue to monitor and call office if he goes back into AFib. Pt understands I will follow up by the end of the week to see how he is doing.  He is agreeable to plan.

## 2017-11-25 NOTE — Telephone Encounter (Signed)
Followed up with patient as I told him I would. Pt reports that he is "doing great", things have "settled down" and "readings are back to normal". Pt will keep follow up with Dr. Curt Bears week after next. Pt appreciates me checking on him.

## 2017-12-06 ENCOUNTER — Ambulatory Visit (INDEPENDENT_AMBULATORY_CARE_PROVIDER_SITE_OTHER): Payer: Medicare Other | Admitting: Cardiology

## 2017-12-06 ENCOUNTER — Encounter: Payer: Self-pay | Admitting: Cardiology

## 2017-12-06 VITALS — BP 134/78 | HR 65 | Ht 72.0 in | Wt 179.6 lb

## 2017-12-06 DIAGNOSIS — I10 Essential (primary) hypertension: Secondary | ICD-10-CM | POA: Diagnosis not present

## 2017-12-06 DIAGNOSIS — I4819 Other persistent atrial fibrillation: Secondary | ICD-10-CM

## 2017-12-06 DIAGNOSIS — I481 Persistent atrial fibrillation: Secondary | ICD-10-CM

## 2017-12-06 DIAGNOSIS — E785 Hyperlipidemia, unspecified: Secondary | ICD-10-CM

## 2017-12-06 NOTE — Progress Notes (Signed)
Electrophysiology Office Note   Date:  12/06/2017   ID:  Brian Horn, DOB January 21, 1936, MRN [id number]  PCP:  [name], MD  Cardiologist:   Primary Electrophysiologist:  Wilberth Damon Meredith [name], MD    No chief complaint on file.    History of Present Illness: Brian Horn is a 82 y.o. male who is being seen today for the evaluation of atrial fibrillation at the request of [name], MD. Presenting today for electrophysiology evaluation.  He has a history of persistent atrial fibrillation, hypertension, hyperlipidemia.  He was put on flecainide at his last visit.  He had QRS widening a left bundle branch pattern and thus flecainide was stopped.  He did have a cardioversion while on flecainide and remains in sinus rhythm.  He feels well without major complaint today.  He had a laparoscopic colectomy on 07/11/2017 for colon cancer.  Today, denies symptoms of palpitations, chest pain, shortness of breath, orthopnea, PND, lower extremity edema, claudication, dizziness, presyncope, syncope, bleeding, or neurologic sequela. The patient is tolerating medications without difficulties.  Overall he is doing well.  He is noted no further episodes of atrial fibrillation.  He did not have atrial fibrillation around the time of his colon cancer surgery.  He had a successful colectomy with negative margins.  He brought in heart rate and blood pressure readings from home.  Blood pressures are in the 120s and heart rates are generally in the 70s to 80s.  Past Medical History:  Diagnosis Date  . Blood dyscrasia    hemachromatosis, takes tx q year  . ED (erectile dysfunction)   . GERD (gastroesophageal reflux disease)   . Glaucoma    right eye  . Hematuria, gross   . History of kidney stones   . Hyperlipidemia   . Hypertension   . Inguinal hernia   . Paroxysmal atrial fibrillation (HCC)   . Peyronie's disease   . Pulmonic stenosis    mild  . Tinnitus    Past Surgical History:  Procedure  Laterality Date  . APPENDECTOMY    . CARDIOVERSION N/A 04/29/2017   Procedure: CARDIOVERSION;  Surgeon: [name], MD;  Location: [facility];  Service: Cardiovascular;  Laterality: N/A;  . CHOLECYSTECTOMY    . colonscopy    . CYSTOSCOPY WITH INSERTION OF UROLIFT N/A 03/15/2017   Procedure: CYSTOSCOPY WITH INSERTION OF UROLIFT;  Surgeon: [name], MD;  Location: [facility];  Service: Urology;  Laterality: N/A;  . EYE SURGERY     ioc for cataracts  . LAPAROSCOPIC RIGHT COLECTOMY Right 07/11/2017   Procedure: LAPAROSCOPIC RIGHT COLECTOMY, REPAIR LAPAROSCOPIC  ABDOMINAL HERNIA;  Surgeon: Alphonsa Overall, MD;  Location: [facility];  Service: General;  Laterality: Right;     Current Outpatient Medications  Medication Sig Dispense Refill  . acetaminophen (TYLENOL) 500 MG tablet Take 500-1,000 mg by mouth every 6 (six) hours as needed (for pain.).    [name] apixaban (ELIQUIS) 5 MG TABS tablet Take 5 mg by mouth 2 (two) times daily.    . benazepril (LOTENSIN) 20 MG tablet Take 20 mg by mouth at bedtime.     . Cholecalciferol (VITAMIN D3) 1000 units CAPS Take 1,000 Units by mouth at bedtime.     . Cinnamon 500 MG TABS Take 500 mg by mouth at bedtime.     [name] DILT-XR 180 MG 24 hr capsule Take 180 mg by mouth daily.    . finasteride (PROSCAR) 5 MG tablet Take 2.5 mg by mouth  at bedtime.     . lansoprazole (PREVACID) 15 MG capsule Take 15 mg by mouth daily before breakfast.    . latanoprost (XALATAN) 0.005 % ophthalmic solution Place 1 drop into both eyes at bedtime.     . Multiple Vitamin (MULTIVITAMIN WITH MINERALS) TABS tablet Take 1 tablet by mouth daily.    . simvastatin (ZOCOR) 10 MG tablet Take 10 mg by mouth at bedtime.      No current facility-administered medications for this visit.     Allergies:   Lipitor [atorvastatin calcium]   Social History:  The patient  reports that he quit smoking about 46 years ago. His smoking use included cigarettes. He has a 7.50 pack-year  smoking history. He has never used smokeless tobacco. He reports that he drinks alcohol. He reports that he does not use drugs.   Family History:  The patient's family history includes Heart disease in his father and mother.   ROS:  Please see the history of present illness.   Otherwise, review of systems is positive for palpitations, bloody stool, easy bruising.   All other systems are reviewed and negative.   PHYSICAL EXAM: VS:  BP 134/78   Pulse 65   Ht 6' (1.829 m)   Wt 179 lb 9.6 oz (81.5 kg)   SpO2 97%   BMI 24.36 kg/m  , BMI Body mass index is 24.36 kg/m. GEN: Well nourished, well developed, in no acute distress  HEENT: normal  Neck: no JVD, carotid bruits, or masses Cardiac: RRR; no murmurs, rubs, or gallops,no edema  Respiratory:  clear to auscultation bilaterally, normal work of breathing GI: soft, nontender, nondistended, + BS MS: no deformity or atrophy  Skin: warm and dry Neuro:  Strength and sensation are intact Psych: euthymic mood, full affect  EKG:  EKG is not ordered today. Personal review of the ekg ordered 06/07/17 shows this rhythm, rate 64  Recent Labs: 07/08/2017: ALT 15 07/13/2017: BUN 9; Creatinine, Ser 0.86; Hemoglobin 10.0; Platelets 220; Potassium 3.9; Sodium 140    Lipid Panel  No results found for: CHOL, TRIG, HDL, CHOLHDL, VLDL, LDLCALC, LDLDIRECT   Wt Readings from Last 3 Encounters:  12/06/17 179 lb 9.6 oz (81.5 kg)  07/15/17 167 lb 8.8 oz (76 kg)  07/08/17 177 lb 2 oz (80.3 kg)      Other studies Reviewed: Additional studies/ records that were reviewed today include: TTE 08/27/15  Review of the above records today demonstrates:  - Left ventricle: The cavity size was normal. Wall thickness was   increased in a pattern of mild LVH. Systolic function was   vigorous. The estimated ejection fraction was in the range of 65%   to 70%. Wall motion was normal; there were no regional wall   motion abnormalities. Doppler parameters are consistent  with   abnormal left ventricular relaxation (grade 1 diastolic   dysfunction). The E/e&' ratio is <8, suggesting normal LV filling   pressure. - Aortic valve: Sclerosis without stenosis. Transvalvular velocity   was minimally increased. Mean gradient (S): 8 mm Hg. Peak   gradient (S): 17 mm Hg. - Left atrium: The atrium was normal in size. - Atrial septum: No defect or patent foramen ovale was identified. - Tricuspid valve: There was trivial regurgitation. - Pulmonic valve: Poorly visualized. Mild pulmonic stenosis. Peak   gradient of 32 mmHg. - Pulmonary arteries: Poorly visualized. PA peak pressure: 11 mm Hg   (S). - Inferior vena cava: The vessel was normal in size. The  respirophasic diameter changes were in the normal range (= 50%),   consistent with normal central venous pressure.   ASSESSMENT AND PLAN:  1.  Persistent atrial fibrillation: Early on Eliquis and diltiazem.  Had previously been on flecainide but had QRS widening in the left bundle branch pattern.  Flecainide was stopped.  He has not had a return of atrial fibrillation.  He did have colon cancer surgery and had no atrial fibrillation at that point.  This patients CHA2DS2-VASc Score and unadjusted Ischemic Stroke Rate (% per year) is equal to 3.2 % stroke rate/year from a score of 3  Above score calculated as 1 point each if present [CHF, HTN, DM, Vascular=MI/PAD/Aortic Plaque, Age if 65-74, or Male] Above score calculated as 2 points each if present [Age > 75, or Stroke/TIA/TE]  2.  Hypertension: Well-controlled.  No changes.  3.  Hyperlipidemia: Continue simvastatin per primary physician  Current medicines are reviewed at length with the patient today.   The patient does not have concerns regarding his medicines.  The following changes were made today: None  Labs/ tests ordered today include:  No orders of the defined types were placed in this encounter.    Disposition:   FU with Elvy Mclarty 6  months  Signed, Rondell Frick Meredith [name], MD  12/06/2017 11:38 AM     [facility] [address] [place] [address] [phone] (office) [phone] (fax)

## 2017-12-06 NOTE — Patient Instructions (Signed)
Medication Instructions:  Your physician recommends that you continue on your current medications as directed. Please refer to the Current Medication list given to you today.  * If you need a refill on your cardiac medications before your next appointment, please call your pharmacy.   Labwork: None ordered  Testing/Procedures: None ordered  Follow-Up: Your physician wants you to follow-up in: 6 months with Dr. Camnitz. You will receive a reminder letter in the mail two months in advance. If you don't receive a letter, please call our office to schedule the follow-up appointment.   *Please note that any paperwork needing to be filled out by the provider will need to be addressed at the front desk prior to seeing the provider. Please note that any FMLA, disability or other documents regarding health condition is subject to a $25.00 charge that must be received prior to completion of paperwork in the form of a money order or check.  Thank you for choosing [facility]!!   Clif Serio, RN [phone]  Any Other Special Instructions Will Be Listed Below (If Applicable).        

## 2017-12-16 DIAGNOSIS — D509 Iron deficiency anemia, unspecified: Secondary | ICD-10-CM | POA: Diagnosis not present

## 2018-01-31 DIAGNOSIS — H401133 Primary open-angle glaucoma, bilateral, severe stage: Secondary | ICD-10-CM | POA: Diagnosis not present

## 2018-01-31 DIAGNOSIS — H52203 Unspecified astigmatism, bilateral: Secondary | ICD-10-CM | POA: Diagnosis not present

## 2018-01-31 DIAGNOSIS — Z961 Presence of intraocular lens: Secondary | ICD-10-CM | POA: Diagnosis not present

## 2018-02-07 DIAGNOSIS — Z23 Encounter for immunization: Secondary | ICD-10-CM | POA: Diagnosis not present

## 2018-04-13 DIAGNOSIS — I480 Paroxysmal atrial fibrillation: Secondary | ICD-10-CM | POA: Diagnosis not present

## 2018-04-13 DIAGNOSIS — N401 Enlarged prostate with lower urinary tract symptoms: Secondary | ICD-10-CM | POA: Diagnosis not present

## 2018-04-13 DIAGNOSIS — K219 Gastro-esophageal reflux disease without esophagitis: Secondary | ICD-10-CM | POA: Diagnosis not present

## 2018-04-13 DIAGNOSIS — I10 Essential (primary) hypertension: Secondary | ICD-10-CM | POA: Diagnosis not present

## 2018-04-13 DIAGNOSIS — N529 Male erectile dysfunction, unspecified: Secondary | ICD-10-CM | POA: Diagnosis not present

## 2018-08-03 ENCOUNTER — Telehealth: Payer: Self-pay | Admitting: *Deleted

## 2018-08-03 NOTE — Telephone Encounter (Signed)
                                1. Confirm consent - Calling patient to let them know due to recent [place] and Health Department Protocols, we are not seeing patients in the office. We are instead seeing if they would like to schedule this appointment as a  Nurse, children's or Laptop. Patient is aware if they decide to reschedule this appointment, they may not be seen or scheduled for the next 4-6 months. As with many in-office visits we must obtain consent. If you'd like, we can send this to patient's mychart (if signed up).  Otherwise, we can obtain verbal consent now.  All virtual visits are billed to patient's insurance company just like a normal visit.  By patient agreeing to a virtual visit, patient is aware that the technology does not allow provider to perform a hands on physical examination, and thus may limit your provider's ability to fully assess your condition.  Patient is aware we cannot assure that it will always work on either patient or our end, and in the setting of a video visit, we may have to convert it to a phone-only visit.  In either situation, we cannot ensure that we have a secure connection. Is patient willing to proceed?  YES   2. Give patient instructions for WebEx/Doximity download to smartphone as below if video visit   3. Patient advised to be prepared with any vital sign or heart rhythm information, their current medicines, and paper and pen handy for any instructions they may receive before, during, or after their visit.   4. Patient informed they will receive a phone call 10-15 minutes prior to their appointment time (may be from unknown caller ID) so they should be prepared to answer       ACCEPTING  [place]  - Patient will receive a text message from a Random 5 Digit Number indicating Provider's Name and a Blue Hyperlink.  Text Message will say "Provider sent you a secure message." with a Blue Hyperlink Attached  -Patient is  to click on [facility] Hyperlink Text message will say "Provider will see you now. Click to join Video call." with another Blue Hyperlink attached.  -Patient is to click Blue hyperlink to [employer] call with said Provider.            \

## 2018-08-11 NOTE — Telephone Encounter (Signed)
Virtual Visit Pre-Appointment Phone Call  Steps For Call:  1. Confirm consent - "In the setting of the current Covid19 crisis, you are scheduled for a (phone or video) visit with your provider on (date) at (time).  Just as we do with many in-office visits, in order for you to participate in this visit, we must obtain consent.  If you'd like, I can send this to your mychart (if signed up) or email for you to review.  Otherwise, I can obtain your verbal consent now.  All virtual visits are billed to your insurance company just like a normal visit would be.  By agreeing to a virtual visit, we'd like you to understand that the technology does not allow for your provider to perform an examination, and thus may limit your provider's ability to fully assess your condition.  Finally, though the technology is pretty good, we cannot assure that it will always work on either your or our end, and in the setting of a video visit, we may have to convert it to a phone-only visit.  In either situation, we cannot ensure that we have a secure connection.  Are you willing to proceed?" STAFF: Did the patient verbally acknowledge consent to telehealth visit? Document YES/NO here: YES  2. Confirm the BEST phone number to call the day of the visit by including in appointment notes  3. Give patient instructions for WebEx/MyChart download to smartphone as below or Doximity/Doxy.me if video visit (depending on what platform provider is using)  4. Advise patient to be prepared with their blood pressure, heart rate, weight, any heart rhythm information, their current medicines, and a piece of paper and pen handy for any instructions they may receive the day of their visit  5. Inform patient they will receive a phone call 15 minutes prior to their appointment time (may be from unknown caller ID) so they should be prepared to answer  6. Confirm that appointment type is correct in Epic appointment notes (VIDEO vs PHONE)      TELEPHONE CALL NOTE  Brian Horn has been deemed a candidate for a follow-up tele-health visit to limit community exposure during the Covid-19 pandemic. I spoke with the patient via phone to ensure availability of phone/video source, confirm preferred email & phone number, and discuss instructions and expectations.  I reminded Brian Horn to be prepared with any vital sign and/or heart rhythm information that could potentially be obtained via home monitoring, at the time of his visit. I reminded Brian Horn to expect a phone call at the time of his visit if his visit.  Stanton Kidney, RN 08/11/2018 9:58 AM   INSTRUCTIONS FOR DOWNLOADING THE WEBEX APP TO SMARTPHONE  - If Apple, ask patient to go to App Store and type in WebEx in the search bar. [place] [employer], the blue/green circle. If Android, go to [employer] and type in [employer] in the search bar. The app is free but as with any other app downloads, their phone may require them to verify saved payment information or Apple/Android password.  - The patient does NOT have to create an account. - On the day of the visit, the assist will walk the patient through joining the meeting with the meeting number/password.  INSTRUCTIONS FOR DOWNLOADING THE MYCHART APP TO SMARTPHONE  - The patient must first make sure to have activated MyChart and know their login information - If Apple, go to [employer] and type  in [place] in the search bar and download the app. If Android, ask patient to go to [employer] and type in [place] in the search bar and download the app. The app is free but as with any other app downloads, their phone may require them to verify saved payment information or Apple/Android password.  - The patient will need to then log into the app with their MyChart username and password, and select [facility] as their healthcare provider to link the account. When it is time for your visit, go to the MyChart  app, find appointments, and click Begin Video Visit. Be sure to Select Allow for your device to access the Microphone and Camera for your visit. You will then be connected, and your provider will be with you shortly.  **If they have any issues connecting, or need assistance please contact MyChart service desk [phone])**  **If using a computer, in order to ensure the best quality for their visit they will need to use either of the following Internet Browsers: [employer], or Google Chrome**  IF USING DOXIMITY or DOXY.ME - The patient will receive a link just prior to their visit, either by text or email (to be determined day of appointment depending on if it's doxy.me or Doximity).     FULL LENGTH CONSENT FOR TELE-HEALTH VISIT   I hereby voluntarily request, consent and authorize [facility] and its employed or contracted physicians, physician assistants, nurse practitioners or other licensed health care professionals (the Practitioner), to provide me with telemedicine health care services (the "Services") as deemed necessary by the treating Practitioner. I acknowledge and consent to receive the Services by the Practitioner via telemedicine. I understand that the telemedicine visit will involve communicating with the Practitioner through live audiovisual communication technology and the disclosure of certain medical information by electronic transmission. I acknowledge that I have been given the opportunity to request an in-person assessment or other available alternative prior to the telemedicine visit and am voluntarily participating in the telemedicine visit.  I understand that I have the right to withhold or withdraw my consent to the use of telemedicine in the course of my care at any time, without affecting my right to future care or treatment, and that the Practitioner or I may terminate the telemedicine visit at any time. I understand that I have the right to inspect all  information obtained and/or recorded in the course of the telemedicine visit and may receive copies of available information for a reasonable fee.  I understand that some of the potential risks of receiving the Services via telemedicine include:  [name] Delay or interruption in medical evaluation due to technological equipment failure or disruption; . Information transmitted may not be sufficient (e.g. poor resolution of images) to allow for appropriate medical decision making by the Practitioner; and/or  . In rare instances, security protocols could fail, causing a breach of personal health information.  Furthermore, I acknowledge that it is my responsibility to provide information about my medical history, conditions and care that is complete and accurate to the best of my ability. I acknowledge that Practitioner's advice, recommendations, and/or decision may be based on factors not within their control, such as incomplete or inaccurate data provided by me or distortions of diagnostic images or specimens that may result from electronic transmissions. I understand that the practice of medicine is not an exact science and that Practitioner makes no warranties or guarantees regarding treatment outcomes. I acknowledge that I will receive a  copy of this consent concurrently upon execution via email to the email address I last provided but may also request a printed copy by calling the office of [facility].    I understand that my insurance will be billed for this visit.   I have read or had this consent read to me. . I understand the contents of this consent, which adequately explains the benefits and risks of the Services being provided via telemedicine.  . I have been provided ample opportunity to ask questions regarding this consent and the Services and have had my questions answered to my satisfaction. . I give my informed consent for the services to be provided through the use of telemedicine in my  medical care  By participating in this telemedicine visit I agree to the above.

## 2018-08-14 ENCOUNTER — Telehealth (INDEPENDENT_AMBULATORY_CARE_PROVIDER_SITE_OTHER): Payer: Medicare Other | Admitting: Cardiology

## 2018-08-14 ENCOUNTER — Other Ambulatory Visit: Payer: Self-pay

## 2018-08-14 ENCOUNTER — Encounter: Payer: Self-pay | Admitting: Cardiology

## 2018-08-14 DIAGNOSIS — I4819 Other persistent atrial fibrillation: Secondary | ICD-10-CM

## 2018-08-14 NOTE — Progress Notes (Signed)
Electrophysiology TeleHealth Note   Due to national recommendations of social distancing due to COVID 19, an audio/video telehealth visit is felt to be most appropriate for this patient at this time.  See Epic message for the patient's consent to telehealth for [facility].   Date:  08/14/2018   ID:  Brian Horn, DOB June 25, 1935, MRN [id number]  Location: patient's home  Provider location: [address], [place]  Evaluation Performed: Follow-up visit  PCP:  Brian Orn, MD  Cardiologist:  No primary care provider on file.  Electrophysiologist:  Dr Brian Horn  Chief Complaint:  AF  History of Present Illness:    Brian Horn is a 83 y.o. male who presents via audio/video conferencing for a telehealth visit today.  Since last being seen in our clinic, the patient reports doing very well.  Today, he denies symptoms of palpitations, chest pain, shortness of breath,  lower extremity edema, dizziness, presyncope, or syncope.  The patient is otherwise without complaint today.  The patient denies symptoms of fevers, chills, cough, or new SOB worrisome for COVID 19.  He has a history significant for persistent atrial fibrillation, hypertension, and hyperlipidemia.  He was initially put on flecainide but developed QRS widening with left bundle branch block pattern and flecainide was stopped.  He does have a history of colon cancer and had a colectomy on 07/11/2017.  Today, denies symptoms of palpitations, chest pain, shortness of breath, orthopnea, PND, lower extremity edema, claudication, dizziness, presyncope, syncope, bleeding, or neurologic sequela. The patient is tolerating medications without difficulties.  He is currently feeling well without any complaint.  He is able to do all his daily activities.  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  Aside from that he has no major complaints.  Past Medical History:   Diagnosis Date  . Blood dyscrasia    hemachromatosis, takes tx q year  . ED (erectile dysfunction)   . GERD (gastroesophageal reflux disease)   . Glaucoma    right eye  . Hematuria, gross   . History of kidney stones   . Hyperlipidemia   . Hypertension   . Inguinal hernia   . Paroxysmal atrial fibrillation (HCC)   . Peyronie's disease   . Pulmonic stenosis    mild  . Tinnitus     Past Surgical History:  Procedure Laterality Date  . APPENDECTOMY    . CARDIOVERSION N/A 04/29/2017   Procedure: CARDIOVERSION;  Surgeon: Brian Klein, MD;  Location: [facility];  Service: Cardiovascular;  Laterality: N/A;  . CHOLECYSTECTOMY    . colonscopy    . CYSTOSCOPY WITH INSERTION OF UROLIFT N/A 03/15/2017   Procedure: CYSTOSCOPY WITH INSERTION OF UROLIFT;  Surgeon: Brian Aloe, MD;  Location: [facility];  Service: Urology;  Laterality: N/A;  . EYE SURGERY     ioc for cataracts  . LAPAROSCOPIC RIGHT COLECTOMY Right 07/11/2017   Procedure: LAPAROSCOPIC RIGHT COLECTOMY, REPAIR LAPAROSCOPIC  ABDOMINAL HERNIA;  Surgeon: Brian Overall, MD;  Location: [facility];  Service: General;  Laterality: Right;    Current Outpatient Medications  Medication Sig Dispense Refill  . acetaminophen (TYLENOL) 500 MG tablet Take 500-1,000 mg by mouth every 6 (six) hours as needed (for pain.).    [name] apixaban (ELIQUIS) 5 MG TABS tablet Take 5 mg by mouth 2 (two) times daily.    . benazepril (LOTENSIN) 20 MG tablet Take 20 mg by mouth at bedtime.     [name]  Cholecalciferol (VITAMIN D3) 1000 units CAPS Take 1,000 Units by mouth at bedtime.     . Cinnamon 500 MG TABS Take 500 mg by mouth at bedtime.     [name] DILT-XR 180 MG 24 hr capsule Take 180 mg by mouth daily.    . finasteride (PROSCAR) 5 MG tablet Take 2.5 mg by mouth at bedtime.     . lansoprazole (PREVACID) 15 MG capsule Take 15 mg by mouth daily before breakfast.    . latanoprost (XALATAN) 0.005 % ophthalmic solution Place 1 drop into both eyes at  bedtime.     . Multiple Vitamin (MULTIVITAMIN WITH MINERALS) TABS tablet Take 1 tablet by mouth daily.    . simvastatin (ZOCOR) 10 MG tablet Take 10 mg by mouth at bedtime.      No current facility-administered medications for this visit.     Allergies:   Lipitor [atorvastatin calcium]   Social History:  The patient  reports that he quit smoking about 47 years ago. His smoking use included cigarettes. He has a 7.50 pack-year smoking history. He has never used smokeless tobacco. He reports current alcohol use. He reports that he does not use drugs.   Family History:  The patient's  family history includes Heart disease in his father and mother.   ROS:  Please see the history of present illness.   All other systems are personally reviewed and negative.    Exam:    Vital Signs:  BP (!) 144/84   Pulse 65   Wt 180 lb (81.6 kg)   BMI 24.41 kg/m   Well appearing, alert and conversant, regular work of breathing,  good skin color Eyes- anicteric, neuro- grossly intact, skin- no apparent rash or lesions or cyanosis, mouth- oral mucosa is pink   Labs/Other Tests and Data Reviewed:    Recent Labs: No results found for requested labs within last 8760 hours.   Wt Readings from Last 3 Encounters:  08/14/18 180 lb (81.6 kg)  12/06/17 179 lb 9.6 oz (81.5 kg)  07/15/17 167 lb 8.8 oz (76 kg)     Other studies personally reviewed: Additional studies/ records that were reviewed today include: ECG 06/07/2017 personally reviewed Review of the above records today demonstrates:   Sinus rhythm   ASSESSMENT & PLAN:    1.  1.  Persistent atrial fibrillation: Currently on Eliquis and diltiazem.  Flecainide caused QRS widening and has since been stopped.  No further episodes of atrial fibrillation.  This patients CHA2DS2-VASc Score and unadjusted Ischemic Stroke Rate (% per year) is equal to 3.2 % stroke rate/year from a score of 3  Above score calculated as 1 point each if present [CHF, HTN,  DM, Vascular=MI/PAD/Aortic Plaque, Age if 65-74, or Male] Above score calculated as 2 points each if present [Age > 75, or Stroke/TIA/TE]  2.  Hypertension: Blood pressure is elevated today and has been elevated in the past.  Mariem Skolnick increase benazepril to 40 mg.  He Farrell Pantaleo need a basic metabolic once we are doing routine labs.  3.  Hyperlipidemia: Continue statin per primary physician.   COVID 19 screen The patient denies symptoms of COVID 19 at this time.  The importance of social distancing was discussed today.  Follow-up: 6 months   Current medicines are reviewed at length with the patient today.   The patient does not have concerns regarding his medicines.  The following changes were made today: Increase benazepril  Labs/ tests ordered today include: BMP No orders of the  defined types were placed in this encounter.    Patient Risk:  after full review of this patients clinical status, I feel that they are at moderate risk at this time.  Today, I have spent 12 minutes with the patient with telehealth technology discussing atrial fibrillation, hypertension.    Signed, Rebacca Votaw Meredith [name], MD  08/14/2018 10:38 AM     [address] [place] [address] [phone] (office) [phone] (fax)

## 2019-02-27 ENCOUNTER — Encounter (INDEPENDENT_AMBULATORY_CARE_PROVIDER_SITE_OTHER): Payer: Self-pay

## 2019-02-27 ENCOUNTER — Encounter: Payer: Self-pay | Admitting: Cardiology

## 2019-02-27 ENCOUNTER — Ambulatory Visit: Payer: Medicare Other | Admitting: Cardiology

## 2019-02-27 ENCOUNTER — Other Ambulatory Visit: Payer: Self-pay

## 2019-02-27 VITALS — BP 122/78 | HR 65 | Ht 72.0 in | Wt 183.2 lb

## 2019-02-27 DIAGNOSIS — I4819 Other persistent atrial fibrillation: Secondary | ICD-10-CM

## 2019-02-27 MED ORDER — DILT-XR 180 MG PO CP24: 180.0000 mg | ORAL_CAPSULE | Freq: Every day | ORAL | 3 refills | Status: DC

## 2019-02-27 MED ORDER — APIXABAN 5 MG PO TABS: 5.0000 mg | ORAL_TABLET | Freq: Two times a day (BID) | ORAL | 3 refills | Status: DC

## 2019-02-27 MED ORDER — BENAZEPRIL HCL 20 MG PO TABS: 20.0000 mg | ORAL_TABLET | Freq: Every day | ORAL | 3 refills | Status: DC

## 2019-02-27 NOTE — Progress Notes (Signed)
Electrophysiology Office Note   Date:  02/27/2019   ID:  Brian Horn, DOB [birth year], MRN [id number]  PCP:  [name], MD  Cardiologist:   Primary Electrophysiologist:  Will Meredith [name], MD    No chief complaint on file.    History of Present Illness: Brian Horn is a 83 y.o. male who is being seen today for the evaluation of atrial fibrillation at the request of [name], MD. Presenting today for electrophysiology evaluation.  He has a history of persistent atrial fibrillation, hypertension, hyperlipidemia.  He was put on flecainide at his last visit.  He had QRS widening a left bundle branch pattern and thus flecainide was stopped.  He did have a cardioversion while on flecainide and remains in sinus rhythm.  He feels well without major complaint today.  He had a laparoscopic colectomy on 07/11/2017 for colon cancer.  Today, denies symptoms of palpitations, chest pain, shortness of breath, orthopnea, PND, lower extremity edema, claudication, dizziness, presyncope, syncope, bleeding, or neurologic sequela. The patient is tolerating medications without difficulties.  Overall he is doing well.  He has no chest pain or shortness of breath.  He is able to do all his daily activities without restriction.  Past Medical History:  Diagnosis Date  . Blood dyscrasia    hemachromatosis, takes tx q year  . ED (erectile dysfunction)   . GERD (gastroesophageal reflux disease)   . Glaucoma    right eye  . Hematuria, gross   . History of kidney stones   . Hyperlipidemia   . Hypertension   . Inguinal hernia   . Paroxysmal atrial fibrillation (HCC)   . Peyronie's disease   . Pulmonic stenosis    mild  . Tinnitus    Past Surgical History:  Procedure Laterality Date  . APPENDECTOMY    . CARDIOVERSION N/A 04/29/2017   Procedure: CARDIOVERSION;  Surgeon: [name], MD;  [facility]: [facility];  Service: Cardiovascular;  Laterality: N/A;  . CHOLECYSTECTOMY    .  colonscopy    . CYSTOSCOPY WITH INSERTION OF UROLIFT N/A 03/15/2017   Procedure: CYSTOSCOPY WITH INSERTION OF UROLIFT;  Surgeon: [name], MD;  Location[facility];  Service: Urology;  Laterality: N/A;  . EYE SURGERY     ioc for cataracts  . LAPAROSCOPIC RIGHT COLECTOMY Right 07/11/2017   Procedure: LAPAROSCOPIC RIGHT COLECTOMY, REPAIR LAPAROSCOPIC  ABDOMINAL HERNIA;  Surgeon: Alphonsa Overall, MD;  Location: [facility];  Service: General;  Laterality: Right;     Current Outpatient Medications  Medication Sig Dispense Refill  . acetaminophen (TYLENOL) 500 MG tablet Take 500-1,000 mg by mouth every 6 (six) hours as needed (for pain.).    [name] apixaban (ELIQUIS) 5 MG TABS tablet Take 1 tablet (5 mg total) by mouth 2 (two) times daily. 180 tablet 3  . benazepril (LOTENSIN) 20 MG tablet Take 1 tablet (20 mg total) by mouth at bedtime. 90 tablet 3  . brimonidine (ALPHAGAN) 0.2 % ophthalmic solution Place 1 drop into both eyes 2 (two) times daily.    . Cholecalciferol (VITAMIN D3) 1000 units CAPS Take 1,000 Units by mouth at bedtime.     . Cinnamon 500 MG TABS Take 500 mg by mouth at bedtime.     [name] DILT-XR 180 MG 24 hr capsule Take 1 capsule (180 mg total) by mouth daily. 90 capsule 3  . finasteride (PROSCAR) 5 MG tablet Take 5 mg by mouth daily.    . lansoprazole (PREVACID) 15 MG capsule  Take 15 mg by mouth daily before breakfast.    . latanoprost (XALATAN) 0.005 % ophthalmic solution Place 1 drop into both eyes at bedtime.     . Multiple Vitamin (MULTIVITAMIN WITH MINERALS) TABS tablet Take 1 tablet by mouth daily.    . simvastatin (ZOCOR) 10 MG tablet Take 10 mg by mouth at bedtime.      No current facility-administered medications for this visit.     Allergies:   Lipitor [atorvastatin calcium]   Social History:  The patient  reports that he quit smoking about 47 years ago. His smoking use included cigarettes. He has a 7.50 pack-year smoking history. He has never used  smokeless tobacco. He reports current alcohol use. He reports that he does not use drugs.   Family History:  The patient's family history includes Heart disease in his father and mother.   ROS:  Please see the history of present illness.   Otherwise, review of systems is positive for none.   All other systems are reviewed and negative.   PHYSICAL EXAM: VS:  BP 122/78   Pulse 65   Ht 6' (1.829 m)   Wt 183 lb 3.2 oz (83.1 kg)   SpO2 97%   BMI 24.85 kg/m  , BMI Body mass index is 24.85 kg/m. GEN: Well nourished, well developed, in no acute distress  HEENT: normal  Neck: no JVD, carotid bruits, or masses Cardiac: RRR; no murmurs, rubs, or gallops,no edema  Respiratory:  clear to auscultation bilaterally, normal work of breathing GI: soft, nontender, nondistended, + BS MS: no deformity or atrophy  Skin: warm and dry Neuro:  Strength and sensation are intact Psych: euthymic mood, full affect  EKG:  EKG is ordered today. Personal review of the ekg ordered shows sinus rhythm, IVCD, rate 65  Recent Labs: No results found for requested labs within last 8760 hours.    Lipid Panel  No results found for: CHOL, TRIG, HDL, CHOLHDL, VLDL, LDLCALC, LDLDIRECT   Wt Readings from Last 3 Encounters:  02/27/19 183 lb 3.2 oz (83.1 kg)  08/14/18 180 lb (81.6 kg)  12/06/17 179 lb 9.6 oz (81.5 kg)      Other studies Reviewed: Additional studies/ records that were reviewed today include: TTE 08/27/15  Review of the above records today demonstrates:  - Left ventricle: The cavity size was normal. Wall thickness was   increased in a pattern of mild LVH. Systolic function was   vigorous. The estimated ejection fraction was in the range of 65%   to 70%. Wall motion was normal; there were no regional wall   motion abnormalities. Doppler parameters are consistent with   abnormal left ventricular relaxation (grade 1 diastolic   dysfunction). The E/e&' ratio is <8, suggesting normal LV filling    pressure. - Aortic valve: Sclerosis without stenosis. Transvalvular velocity   was minimally increased. Mean gradient (S): 8 mm Hg. Peak   gradient (S): 17 mm Hg. - Left atrium: The atrium was normal in size. - Atrial septum: No defect or patent foramen ovale was identified. - Tricuspid valve: There was trivial regurgitation. - Pulmonic valve: Poorly visualized. Mild pulmonic stenosis. Peak   gradient of 32 mmHg. - Pulmonary arteries: Poorly visualized. PA peak pressure: 11 mm Hg   (S). - Inferior vena cava: The vessel was normal in size. The   respirophasic diameter changes were in the normal range (= 50%),   consistent with normal central venous pressure.   ASSESSMENT AND PLAN:  1.  Persistent atrial fibrillation: Currently on Eliquis and diltiazem.  Had previously been on flecainide but had a widening of the QRS.  He remains in sinus rhythm.  No changes.  This patients CHA2DS2-VASc Score and unadjusted Ischemic Stroke Rate (% per year) is equal to 3.2 % stroke rate/year from a score of 3  Above score calculated as 1 point each if present [CHF, HTN, DM, Vascular=MI/PAD/Aortic Plaque, Age if 65-74, or Male] Above score calculated as 2 points each if present [Age > 75, or Stroke/TIA/TE]   2.  Hypertension: Currently well controlled  3.  Hyperlipidemia: Continue simvastatin per primary care  Current medicines are reviewed at length with the patient today.   The patient does not have concerns regarding his medicines.  The following changes were made today: None  Labs/ tests ordered today include:  Orders Placed This Encounter  Procedures  . EKG 12-Lead     Disposition:   FU with Will Camnitz 12 months  Signed, Will Meredith [name], MD  02/27/2019 11:04 AM     [facility] [address] [place] [address] [phone] (office) [phone] (fax)

## 2019-02-27 NOTE — Patient Instructions (Signed)
Medication Instructions:  Your physician recommends that you continue on your current medications as directed. Please refer to the Current Medication list given to you today.  * If you need a refill on your cardiac medications before your next appointment, please call your pharmacy.   Labwork: None ordered If you have labs (blood work) drawn today and your tests are completely normal, you will receive your results only by:  [place] (if you have MyChart) OR  A paper copy in the mail If you have any lab test that is abnormal or we need to change your treatment, we will call you to review the results.  Testing/Procedures: None ordered  Follow-Up: At [facility], you and your health needs are our priority.  As part of our continuing mission to provide you with exceptional heart care, we have created designated Provider Care Teams.  These Care Teams include your primary Cardiologist (physician) and Advanced Practice Providers (APPs -  Physician Assistants and Nurse Practitioners) who all work together to provide you with the care you need, when you need it.  You will need a follow up appointment in 1 year.  Please call our office 2 months in advance to schedule this appointment.  You may see Dr Curt Bears or one of the following Advanced Practice Providers on your designated Care Team:    Chanetta Marshall, NP  Tommye Standard, PA-C  Oda Kilts, [place]  *Please note that any paperwork needing to be filled out by the provider will need to be addressed at the front desk prior to seeing the provider. Please note that any FMLA, disability or other documents regarding health condition is subject to a $25.00 charge that must be received prior to completion of paperwork in the form of a money order or check.  Thank you for choosing [facility]!!   Trinidad Curet, RN [phone]  Any Other Special Instructions Will Be Listed Below (If Applicable).

## 2019-07-14 IMAGING — CT CT ABD-PELV W/ CM
2 of 5 series · 15 of 46 positions shown, 17 images · IV contrast (iopamidol)
Comparison: 09/16/2005

CLINICAL DATA: Ascending colon cancer, for staging

EXAM:
CT ABDOMEN AND PELVIS WITH CONTRAST
TECHNIQUE: Multidetector CT imaging of the abdomen and pelvis was performed
using the standard protocol following bolus administration of
intravenous contrast.
CONTRAST:  100mL [id number] IOPAMIDOL ([id number]) INJECTION 61%
Creatinine was obtained on site at [HOSPITAL] at [REDACTED].
Results: Creatinine 1.0 mg/dL.

[Series 2: abd pelvis 5.00 br40 s3 ax · axial · 0.65mm/px · z∈[+1296,+1716]mm · 12 of 94 slices shown, 14 images]
[im 5/94  soft-tissue]
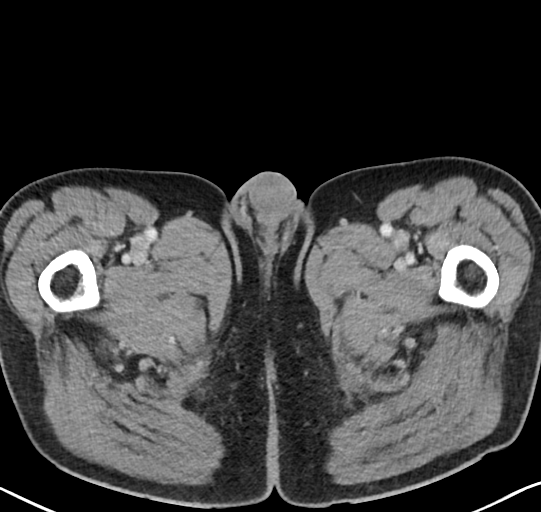
[im 5/94  bone]
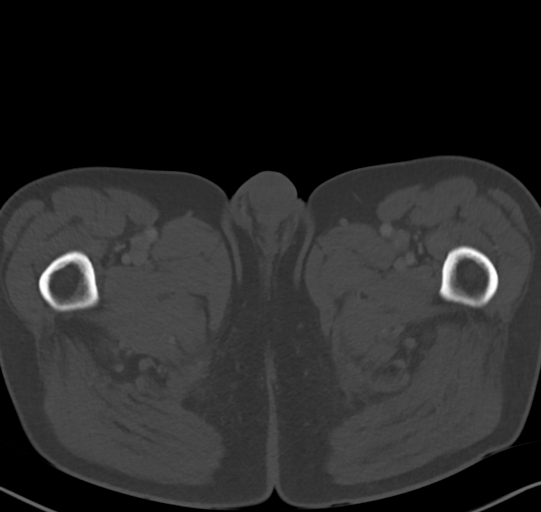
[im 14/94  soft-tissue]
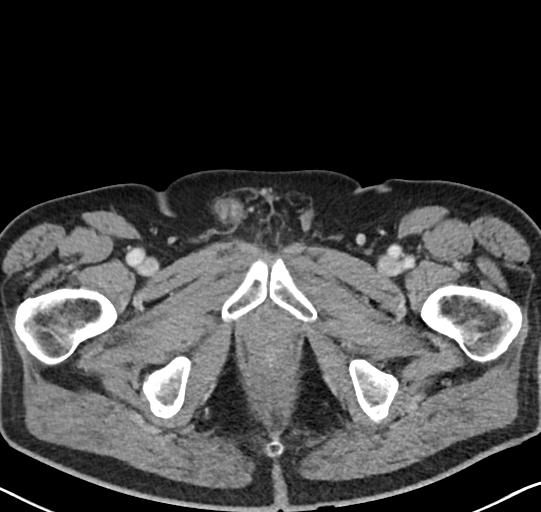
[im 19/94  soft-tissue]
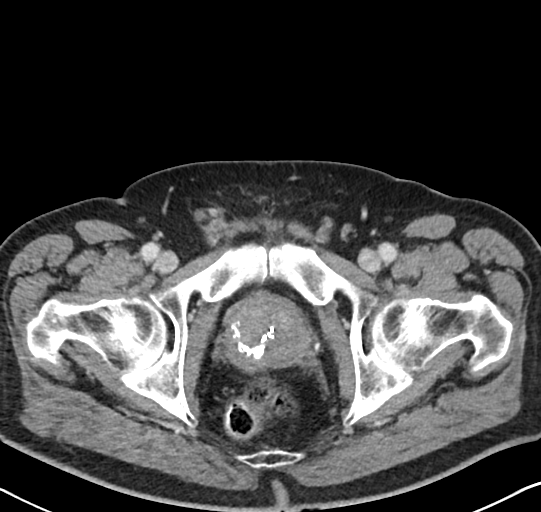
[im 28/94  soft-tissue]
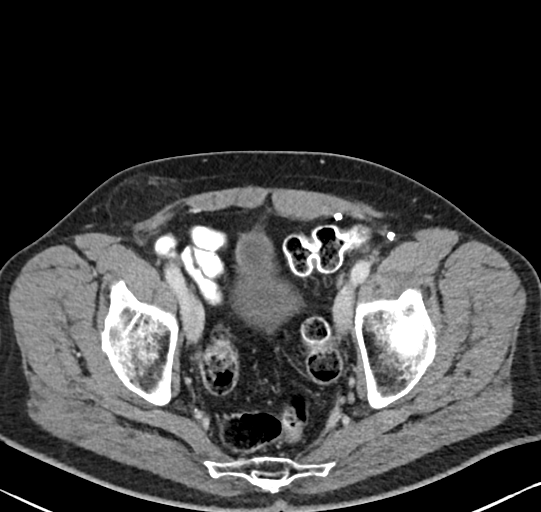
[im 38/94  soft-tissue]
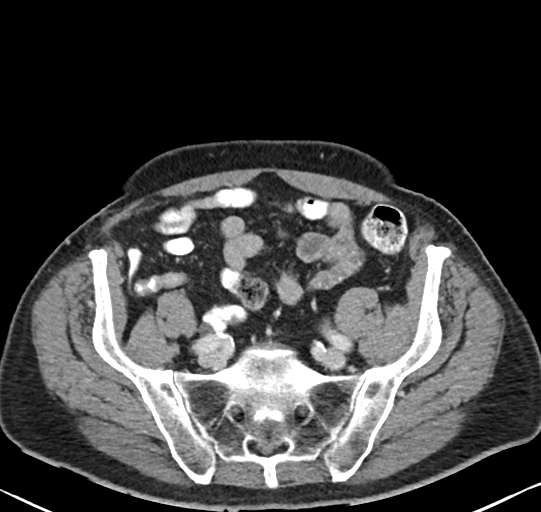
[im 42/94  soft-tissue]
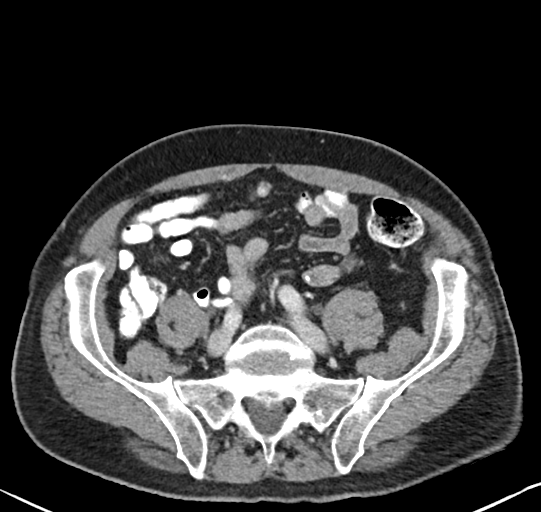
[im 52/94  soft-tissue]
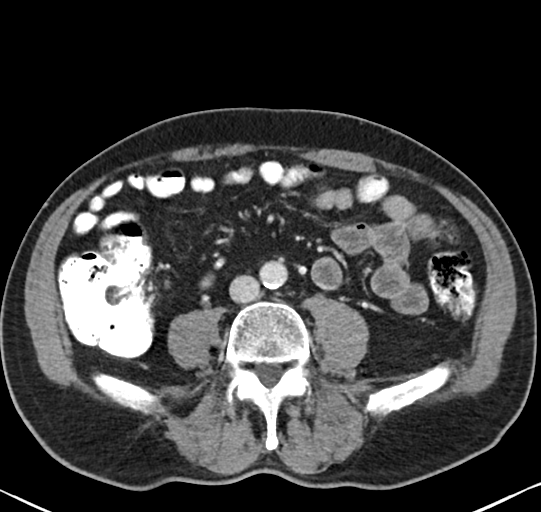
[im 56/94  soft-tissue]
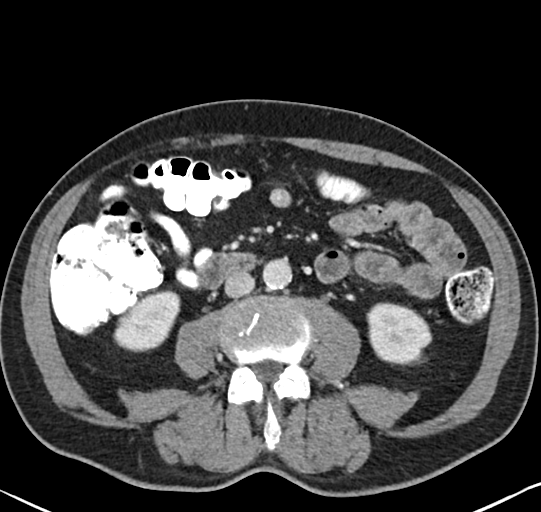
[im 66/94  soft-tissue]
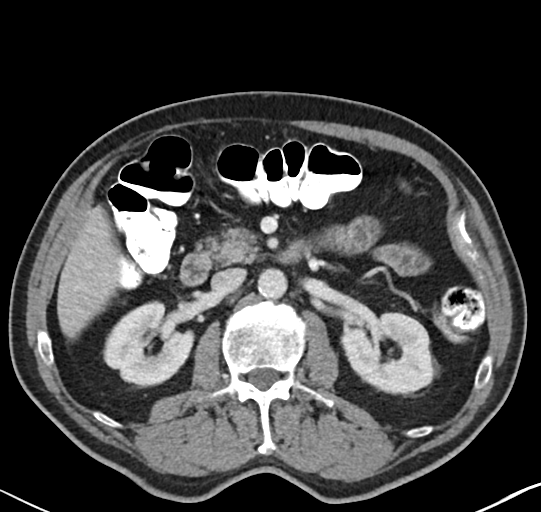
[im 66/94  bone]
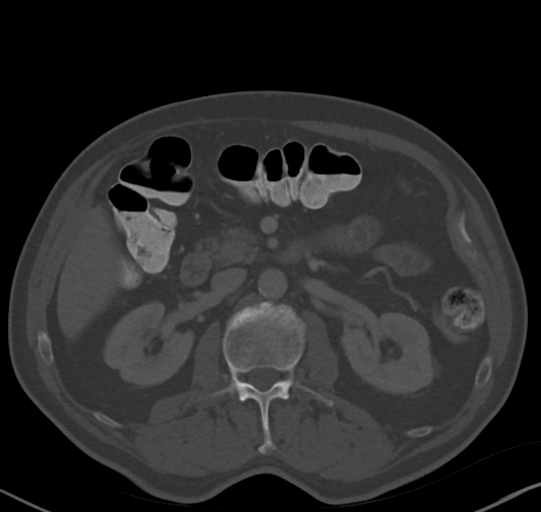
[im 75/94  soft-tissue]
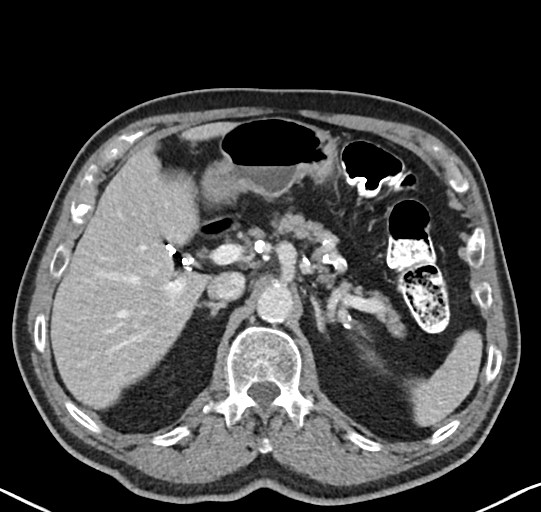
[im 80/94  soft-tissue]
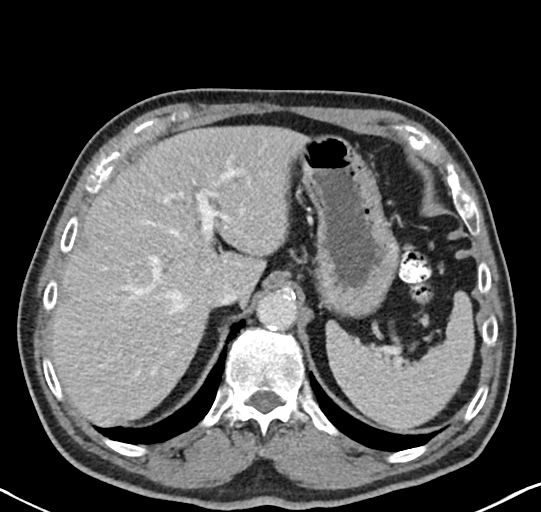
[im 89/94  soft-tissue]
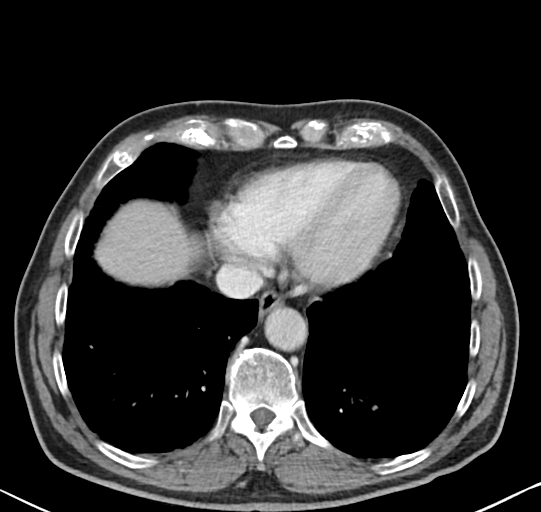

[Series 6: abd pelvis 2.00 br40 s3 cor · coronal · 0.69mm/px · 3 of 137 slices shown]
[im 46/137  soft-tissue]
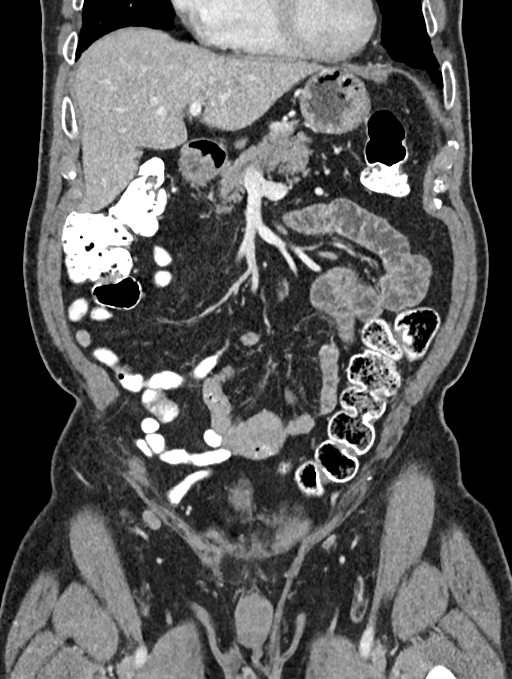
[im 61/137  soft-tissue]
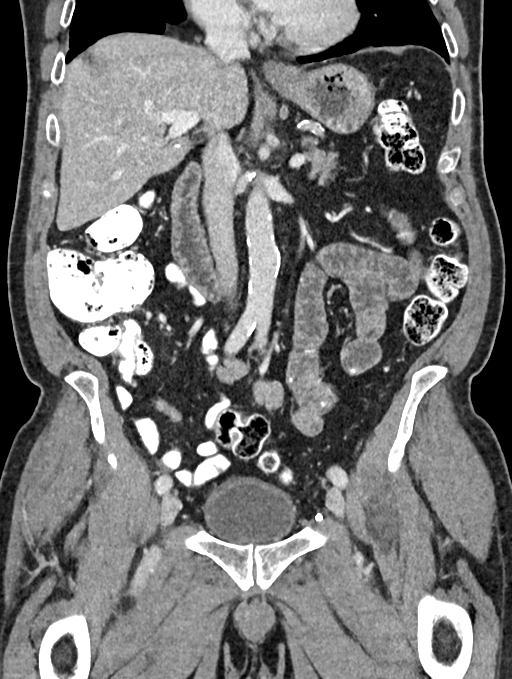
[im 76/137  soft-tissue]
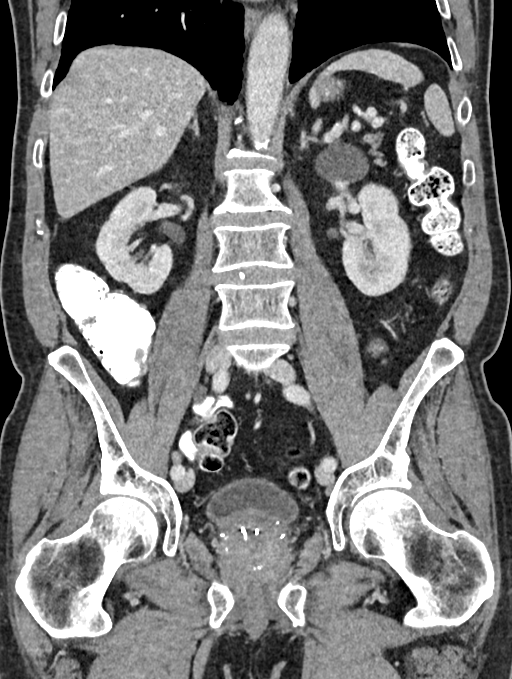

[15 of 46 positions shown; findings below may reference images not displayed]

FINDINGS: Lower chest: Mild subpleural reticulation in the bilateral lung
bases.

Hepatobiliary: Liver is within normal limits.

Status post cholecystectomy. No intrahepatic or extrahepatic ductal
dilatation.

Pancreas: Within normal limits.

Spleen: Within normal limits.

Adrenals/Urinary Tract: Adrenal glands are within normal limits.

4.2 cm anterior left upper pole renal cyst (series 2/image 24). 5 mm
medial right upper pole renal cyst (series 2/image 27). 2 mm 2 mm
nonobstructing right lower pole renal calculus (series 2/image 36).
No hydronephrosis.

Bladder is within normal limits.

Stomach/Bowel: Stomach is within normal limits.

No evidence of bowel obstruction.

Prior appendectomy.

2.2 x 1.0 cm eccentric polypoid soft tissue lesion along the
cecum/ascending colon (series 2/image 44), likely corresponding to
the patient's known colon cancer. No extension into the pericolonic
soft tissues.

Vascular/Lymphatic: No evidence of abdominal aortic aneurysm.

Atherosclerotic calcifications of the abdominal aorta and branch
vessels.

No suspicious abdominopelvic lymphadenopathy. Specifically, no
suspicious ileocolic lymphadenopathy.

Reproductive: Prostatomegaly with enlargement of the central gland,
suggesting BPH. Postsurgical changes related to urolift procedure.
Dystrophic calcifications.

Other: No abdominopelvic ascites.

Small fat containing hernia along the lateral right lower abdominal
wall (series 2/image 64).

Musculoskeletal: Degenerative changes of the visualized
thoracolumbar spine.
IMPRESSION: 2.2 cm polypoid soft tissue lesion along the cecum/ascending colon,
likely corresponding to the patient's known colon cancer.

No evidence of metastatic disease.

Additional ancillary findings as above.

## 2019-08-14 DIAGNOSIS — D0462 Carcinoma in situ of skin of left upper limb, including shoulder: Secondary | ICD-10-CM | POA: Diagnosis not present

## 2019-08-14 DIAGNOSIS — Z85828 Personal history of other malignant neoplasm of skin: Secondary | ICD-10-CM | POA: Diagnosis not present

## 2019-08-14 DIAGNOSIS — L570 Actinic keratosis: Secondary | ICD-10-CM | POA: Diagnosis not present

## 2019-10-23 DIAGNOSIS — H524 Presbyopia: Secondary | ICD-10-CM | POA: Diagnosis not present

## 2019-10-23 DIAGNOSIS — Z961 Presence of intraocular lens: Secondary | ICD-10-CM | POA: Diagnosis not present

## 2019-10-23 DIAGNOSIS — H52203 Unspecified astigmatism, bilateral: Secondary | ICD-10-CM | POA: Diagnosis not present

## 2019-10-23 DIAGNOSIS — H401133 Primary open-angle glaucoma, bilateral, severe stage: Secondary | ICD-10-CM | POA: Diagnosis not present

## 2019-10-30 ENCOUNTER — Telehealth: Payer: Self-pay | Admitting: Cardiology

## 2019-10-30 NOTE — Telephone Encounter (Signed)
Spoke with the patient who states that his heart rate has been running low for the past few days. Yesterday BP was 132/62, HR 52. Earlier today BP 127/58, HR 43. He states that he feels okay but reports some weakness and dizziness with exertion. Patient is staying hydrated and taking all medications as prescribed. He does not have current BP or HR and is not symptomatic. Advised to continue to monitor heart rate and let us know if it remains low. Will make Dr. Curt Bears aware and call back with any recommendations.

## 2019-10-30 NOTE — Telephone Encounter (Signed)
Pt c/o BP issue: STAT if pt c/o blurred vision, one-sided weakness or slurred speech  1. What are your last 5 BP readings?  10/30/19 132/62 HR 52 10/31/19 127/58 HR 43  2. Are you having any other symptoms (ex. Dizziness, headache, blurred vision, passed out)? Weakness   3. What is your BP issue? Hypertension off and on sine 10/27/19.

## 2019-11-06 ENCOUNTER — Telehealth: Payer: Self-pay | Admitting: Cardiology

## 2019-11-06 NOTE — Telephone Encounter (Signed)
Pt was calling to speak with Dr. Curt Bears or nurse. Says that he has called 3 times and has received no phone call back.

## 2019-11-06 NOTE — Telephone Encounter (Signed)
Pt reports that he called last week and expected a call back.  Explained that the note stated we would call back if different advisement, in which there was none.  Pt states that is not what he understood to be happening and that he thought he would get a call back.   Also reports that he has called twice this week - once yesterday with no return call and then again today.  States that he "got the run around today" when trying to reach me.  Pt reports last week he experienced some HRs in the 30s.  Reports feeling dizzy/light-headed when HRs would drop.  It is better this week w/ no issues like last week. Pt advised to continue monitoring for now, per Dr. Curt Bears.  Advised to call office back if begins to occur again and/or symptoms worsen. Aware that if re-occurs we will probably order a monitor to determine abnormality, if any. Patient verbalized understanding and agreeable to plan.

## 2019-11-12 DIAGNOSIS — N401 Enlarged prostate with lower urinary tract symptoms: Secondary | ICD-10-CM | POA: Diagnosis not present

## 2019-11-12 DIAGNOSIS — Z0000 Encounter for general adult medical examination without abnormal findings: Secondary | ICD-10-CM | POA: Diagnosis not present

## 2019-11-12 DIAGNOSIS — Z1389 Encounter for screening for other disorder: Secondary | ICD-10-CM | POA: Diagnosis not present

## 2019-11-12 DIAGNOSIS — I480 Paroxysmal atrial fibrillation: Secondary | ICD-10-CM | POA: Diagnosis not present

## 2019-11-12 DIAGNOSIS — I10 Essential (primary) hypertension: Secondary | ICD-10-CM | POA: Diagnosis not present

## 2019-11-12 DIAGNOSIS — E7800 Pure hypercholesterolemia, unspecified: Secondary | ICD-10-CM | POA: Diagnosis not present

## 2019-11-12 DIAGNOSIS — K219 Gastro-esophageal reflux disease without esophagitis: Secondary | ICD-10-CM | POA: Diagnosis not present

## 2019-11-12 DIAGNOSIS — Z23 Encounter for immunization: Secondary | ICD-10-CM | POA: Diagnosis not present

## 2019-11-12 DIAGNOSIS — Z85038 Personal history of other malignant neoplasm of large intestine: Secondary | ICD-10-CM | POA: Diagnosis not present

## 2019-11-12 DIAGNOSIS — C434 Malignant melanoma of scalp and neck: Secondary | ICD-10-CM | POA: Diagnosis not present

## 2019-11-12 DIAGNOSIS — R7301 Impaired fasting glucose: Secondary | ICD-10-CM | POA: Diagnosis not present

## 2019-11-21 DIAGNOSIS — E538 Deficiency of other specified B group vitamins: Secondary | ICD-10-CM | POA: Diagnosis not present

## 2019-11-22 DIAGNOSIS — E539 Vitamin B deficiency, unspecified: Secondary | ICD-10-CM | POA: Diagnosis not present

## 2019-11-23 DIAGNOSIS — E538 Deficiency of other specified B group vitamins: Secondary | ICD-10-CM | POA: Diagnosis not present

## 2019-11-26 DIAGNOSIS — E538 Deficiency of other specified B group vitamins: Secondary | ICD-10-CM | POA: Diagnosis not present

## 2019-11-27 DIAGNOSIS — E538 Deficiency of other specified B group vitamins: Secondary | ICD-10-CM | POA: Diagnosis not present

## 2019-12-04 DIAGNOSIS — E538 Deficiency of other specified B group vitamins: Secondary | ICD-10-CM | POA: Diagnosis not present

## 2019-12-11 DIAGNOSIS — E538 Deficiency of other specified B group vitamins: Secondary | ICD-10-CM | POA: Diagnosis not present

## 2019-12-18 DIAGNOSIS — E538 Deficiency of other specified B group vitamins: Secondary | ICD-10-CM | POA: Diagnosis not present

## 2020-01-21 DIAGNOSIS — E538 Deficiency of other specified B group vitamins: Secondary | ICD-10-CM | POA: Diagnosis not present

## 2020-01-22 ENCOUNTER — Encounter: Payer: Self-pay | Admitting: Cardiology

## 2020-01-22 ENCOUNTER — Other Ambulatory Visit: Payer: Self-pay

## 2020-01-22 ENCOUNTER — Ambulatory Visit: Payer: Medicare PPO | Admitting: Cardiology

## 2020-01-22 VITALS — BP 124/62 | HR 34 | Ht 72.0 in | Wt 173.0 lb

## 2020-01-22 DIAGNOSIS — I4819 Other persistent atrial fibrillation: Secondary | ICD-10-CM

## 2020-01-22 NOTE — Patient Instructions (Signed)
Medication Instructions:  Your physician has recommended you make the following change in your medication:  1. STOP Diltiazem  *If you need a refill on your cardiac medications before your next appointment, please call your pharmacy*   Lab Work: None ordered   Testing/Procedures: None ordered   Follow-Up: At [facility], you and your health needs are our priority.  As part of our continuing mission to provide you with exceptional heart care, we have created designated Provider Care Teams.  These Care Teams include your primary Cardiologist (physician) and Advanced Practice Providers (APPs -  Physician Assistants and Nurse Practitioners) who all work together to provide you with the care you need, when you need it.  We recommend signing up for the patient portal called "MyChart".  Sign up information is provided on this After Visit Summary.  MyChart is used to connect with patients for Virtual Visits (Telemedicine).  Patients are able to view lab/test results, encounter notes, upcoming appointments, etc.  Non-urgent messages can be sent to your provider as well.   To learn more about what you can do with MyChart, go to [email or web address].    Your next appointment:   3 month(s)  The format for your next appointment:   In Person  Provider:   Allegra Lai, MD    Thank you for choosing [facility]!!   Trinidad Curet, RN [phone]

## 2020-01-22 NOTE — Progress Notes (Signed)
Electrophysiology Office Note   Date:  01/22/2020   ID:  RIP HAWES, DOB 1935-09-20, MRN [id number]  PCP:  [name], MD  Cardiologist:   Primary Electrophysiologist:  Camron Monday Meredith [name], MD    No chief complaint on file.    History of Present Illness: Brian Horn is a 84 y.o. male who is being seen today for the evaluation of atrial fibrillation at the request of [name], MD. Presenting today for electrophysiology evaluation.  He has a history of persistent atrial fibrillation, hypertension, and hyperlipidemia.  He was put on flecainide, but unfortunately had QRS widening and a left bundle branch pattern.  Flecainide was thus stopped.  He also has a history of colon cancer and is status post laparoscopic colectomy 07/11/2017.   Today, denies symptoms of palpitations, chest pain, shortness of breath, orthopnea, PND, lower extremity edema, claudication, dizziness, presyncope, syncope, bleeding, or neurologic sequela. The patient is tolerating medications without difficulties.  Since last being seen he has done well.  He has no chest pain or shortness of breath.  He has noted few episodes of feeling weak and fatigued.  ECG today shows 2-1 AV block that is correlating to his weakness and fatigue.  On auscultation his heart rate is regular, and his fatigue has gone away.  It is certainly possible that his heart is causing his fatigue.  Past Medical History:  Diagnosis Date  . Blood dyscrasia    hemachromatosis, takes tx q year  . ED (erectile dysfunction)   . GERD (gastroesophageal reflux disease)   . Glaucoma    right eye  . Hematuria, gross   . History of kidney stones   . Hyperlipidemia   . Hypertension   . Inguinal hernia   . Paroxysmal atrial fibrillation (HCC)   . Peyronie's disease   . Pulmonic stenosis    mild  . Tinnitus    Past Surgical History:  Procedure Laterality Date  . APPENDECTOMY    . CARDIOVERSION N/A 04/29/2017   Procedure: CARDIOVERSION;   Surgeon: [name], MD;  Location: [facility];  Service: Cardiovascular;  Laterality: N/A;  . CHOLECYSTECTOMY    . colonscopy    . CYSTOSCOPY WITH INSERTION OF UROLIFT N/A 03/15/2017   Procedure: CYSTOSCOPY WITH INSERTION OF UROLIFT;  Surgeon: [name], MD;  Location: [facility];  Service: Urology;  Laterality: N/A;  . EYE SURGERY     ioc for cataracts  . LAPAROSCOPIC RIGHT COLECTOMY Right 07/11/2017   Procedure: LAPAROSCOPIC RIGHT COLECTOMY, REPAIR LAPAROSCOPIC  ABDOMINAL HERNIA;  Surgeon: Alphonsa Overall, MD;  Location: [facility];  Service: General;  Laterality: Right;     Current Outpatient Medications  Medication Sig Dispense Refill  . acetaminophen (TYLENOL) 500 MG tablet Take 500-1,000 mg by mouth every 6 (six) hours as needed (for pain.).    [name] apixaban (ELIQUIS) 5 MG TABS tablet Take 1 tablet (5 mg total) by mouth 2 (two) times daily. 180 tablet 3  . benazepril (LOTENSIN) 20 MG tablet Take 1 tablet (20 mg total) by mouth at bedtime. 90 tablet 3  . brimonidine (ALPHAGAN) 0.2 % ophthalmic solution Place 1 drop into both eyes 2 (two) times daily.    . Cholecalciferol (VITAMIN D3) 1000 units CAPS Take 1,000 Units by mouth at bedtime.     . Cinnamon 500 MG TABS Take 500 mg by mouth at bedtime.     . finasteride (PROSCAR) 5 MG tablet Take 5 mg by mouth daily.    [name]  lansoprazole (PREVACID) 15 MG capsule Take 15 mg by mouth daily before breakfast.    . latanoprost (XALATAN) 0.005 % ophthalmic solution Place 1 drop into both eyes at bedtime.     . Multiple Vitamin (MULTIVITAMIN WITH MINERALS) TABS tablet Take 1 tablet by mouth daily.    . simvastatin (ZOCOR) 10 MG tablet Take 10 mg by mouth at bedtime.      No current facility-administered medications for this visit.    Allergies:   Lipitor [atorvastatin calcium]   Social History:  The patient  reports that he quit smoking about 48 years ago. His smoking use included cigarettes. He has a 7.50 pack-year smoking  history. He has never used smokeless tobacco. He reports current alcohol use. He reports that he does not use drugs.   Family History:  The patient's family history includes Heart disease in his father and mother.   ROS:  Please see the history of present illness.   Otherwise, review of systems is positive for none.   All other systems are reviewed and negative.   PHYSICAL EXAM: VS:  BP 124/62   Pulse (!) 34   Ht 6' (1.829 m)   Wt 173 lb (78.5 kg)   BMI 23.46 kg/m  , BMI Body mass index is 23.46 kg/m. GEN: Well nourished, well developed, in no acute distress  HEENT: normal  Neck: no JVD, carotid bruits, or masses Cardiac: RRR; no murmurs, rubs, or gallops,no edema  Respiratory:  clear to auscultation bilaterally, normal work of breathing GI: soft, nontender, nondistended, + BS MS: no deformity or atrophy  Skin: warm and dry Neuro:  Strength and sensation are intact Psych: euthymic mood, full affect  EKG:  EKG is ordered today. Personal review of the ekg ordered shows sinus rhythm 34 he is into  Recent Labs: No results found for requested labs within last 8760 hours.    Lipid Panel  No results found for: CHOL, TRIG, HDL, CHOLHDL, VLDL, LDLCALC, LDLDIRECT   Wt Readings from Last 3 Encounters:  01/22/20 173 lb (78.5 kg)  02/27/19 183 lb 3.2 oz (83.1 kg)  08/14/18 180 lb (81.6 kg)      Other studies Reviewed: Additional studies/ records that were reviewed today include: TTE 08/27/15  Review of the above records today demonstrates:  - Left ventricle: The cavity size was normal. Wall thickness was   increased in a pattern of mild LVH. Systolic function was   vigorous. The estimated ejection fraction was in the range of 65%   to 70%. Wall motion was normal; there were no regional wall   motion abnormalities. Doppler parameters are consistent with   abnormal left ventricular relaxation (grade 1 diastolic   dysfunction). The E/e&' ratio is <8, suggesting normal LV filling    pressure. - Aortic valve: Sclerosis without stenosis. Transvalvular velocity   was minimally increased. Mean gradient (S): 8 mm Hg. Peak   gradient (S): 17 mm Hg. - Left atrium: The atrium was normal in size. - Atrial septum: No defect or patent foramen ovale was identified. - Tricuspid valve: There was trivial regurgitation. - Pulmonic valve: Poorly visualized. Mild pulmonic stenosis. Peak   gradient of 32 mmHg. - Pulmonary arteries: Poorly visualized. PA peak pressure: 11 mm Hg   (S). - Inferior vena cava: The vessel was normal in size. The   respirophasic diameter changes were in the normal range (= 50%),   consistent with normal central venous pressure.   ASSESSMENT AND PLAN:  1.  Persistent  atrial fibrillation: Currently on Eliquis and diltiazem.  And previously been on flecainide with widening of his QRS.  CHA2DS2-VASc of 3.  Unfortunately he is in 2-1 AV block today.  We Jakaylee Sasaki stop diltiazem.  2.  Hypertension: Currently well controlled  3.  Hyperlipidemia: Continue simvastatin per primary care  Current medicines are reviewed at length with the patient today.   The patient does not have concerns regarding his medicines.  The following changes were made today: None  Labs/ tests ordered today include:  No orders of the defined types were placed in this encounter.    Disposition:   FU with Fritzie Prioleau 3 months  Signed, Sylver Vantassell Meredith [name], MD  01/22/2020 11:57 AM     [facility] [address] [place] [address] [phone] (office) [phone] (fax)

## 2020-01-24 NOTE — Addendum Note (Signed)
Addended by: Campbell Riches on: 01/24/2020 10:18 AM   Modules accepted: Orders

## 2020-02-21 ENCOUNTER — Other Ambulatory Visit: Payer: Self-pay | Admitting: Pediatric Rheumatology

## 2020-02-21 ENCOUNTER — Other Ambulatory Visit: Payer: Self-pay | Admitting: Internal Medicine

## 2020-02-21 ENCOUNTER — Ambulatory Visit
Admission: RE | Admit: 2020-02-21 | Discharge: 2020-02-21 | Disposition: A | Discharge status: Home / Self Care | Payer: Medicare PPO | Source: Ambulatory Visit | Attending: Pediatric Rheumatology | Admitting: Pediatric Rheumatology

## 2020-02-21 DIAGNOSIS — M25571 Pain in right ankle and joints of right foot: Secondary | ICD-10-CM

## 2020-02-21 DIAGNOSIS — M7989 Other specified soft tissue disorders: Secondary | ICD-10-CM | POA: Diagnosis not present

## 2020-02-21 DIAGNOSIS — E538 Deficiency of other specified B group vitamins: Secondary | ICD-10-CM | POA: Diagnosis not present

## 2020-02-26 ENCOUNTER — Other Ambulatory Visit: Payer: Self-pay | Admitting: Cardiology

## 2020-02-29 ENCOUNTER — Other Ambulatory Visit: Payer: Self-pay | Admitting: Cardiology

## 2020-02-29 NOTE — Telephone Encounter (Signed)
Prescription refill request for Eliquis received. Indication:afib Last office visit:01/22/20 Scr:0.9 per careeverywhere 06/12/19 Age: 84 Weight:78.5

## 2020-03-11 DIAGNOSIS — H401133 Primary open-angle glaucoma, bilateral, severe stage: Secondary | ICD-10-CM | POA: Diagnosis not present

## 2020-03-24 DIAGNOSIS — E538 Deficiency of other specified B group vitamins: Secondary | ICD-10-CM | POA: Diagnosis not present

## 2020-04-24 DIAGNOSIS — E538 Deficiency of other specified B group vitamins: Secondary | ICD-10-CM | POA: Diagnosis not present

## 2020-05-01 ENCOUNTER — Ambulatory Visit: Payer: Medicare PPO | Admitting: Student

## 2020-05-12 ENCOUNTER — Ambulatory Visit: Payer: Medicare PPO | Admitting: Student

## 2020-05-13 NOTE — Progress Notes (Deleted)
PCP:  [name], MD Primary Cardiologist: No primary care provider on file. Electrophysiologist: Will Meredith [name], MD   Brian Horn is a 85 y.o. male seen today for Will Meredith [name], MD for routine electrophysiology followup.  Since last being seen in our clinic the patient reports doing ***.  he denies chest pain, palpitations, dyspnea, PND, orthopnea, nausea, vomiting, dizziness, syncope, edema, weight gain, or early satiety.  Past Medical History:  Diagnosis Date  . Blood dyscrasia    hemachromatosis, takes tx q year  . ED (erectile dysfunction)   . GERD (gastroesophageal reflux disease)   . Glaucoma    right eye  . Hematuria, gross   . History of kidney stones   . Hyperlipidemia   . Hypertension   . Inguinal hernia   . Paroxysmal atrial fibrillation (HCC)   . Peyronie's disease   . Pulmonic stenosis    mild  . Tinnitus    Past Surgical History:  Procedure Laterality Date  . APPENDECTOMY    . CARDIOVERSION N/A 04/29/2017   Procedure: CARDIOVERSION;  Surgeon: [name], MD;  Location: [facility];  Service: Cardiovascular;  Laterality: N/A;  . CHOLECYSTECTOMY    . colonscopy    . CYSTOSCOPY WITH INSERTION OF UROLIFT N/A 03/15/2017   Procedure: CYSTOSCOPY WITH INSERTION OF UROLIFT;  Surgeon: [name], MD;  Location: [facility];  Service: Urology;  Laterality: N/A;  . EYE SURGERY     ioc for cataracts  . LAPAROSCOPIC RIGHT COLECTOMY Right 07/11/2017   Procedure: LAPAROSCOPIC RIGHT COLECTOMY, REPAIR LAPAROSCOPIC  ABDOMINAL HERNIA;  Surgeon: Alphonsa Overall, MD;  Location: [facility];  Service: General;  Laterality: Right;    Current Outpatient Medications  Medication Sig Dispense Refill  . acetaminophen (TYLENOL) 500 MG tablet Take 500-1,000 mg by mouth every 6 (six) hours as needed (for pain.).    [name] benazepril (LOTENSIN) 20 MG tablet TAKE 1 TABLET BY MOUTH EVERYDAY AT BEDTIME 90 tablet 1  . brimonidine (ALPHAGAN) 0.2 % ophthalmic  solution Place 1 drop into both eyes 2 (two) times daily.    . Cholecalciferol (VITAMIN D3) 1000 units CAPS Take 1,000 Units by mouth at bedtime.     . Cinnamon 500 MG TABS Take 500 mg by mouth at bedtime.     [name] ELIQUIS 5 MG TABS tablet TAKE 1 TABLET BY MOUTH TWICE A DAY 180 tablet 1  . finasteride (PROSCAR) 5 MG tablet Take 5 mg by mouth daily.    . lansoprazole (PREVACID) 15 MG capsule Take 15 mg by mouth daily before breakfast.    . latanoprost (XALATAN) 0.005 % ophthalmic solution Place 1 drop into both eyes at bedtime.     . Multiple Vitamin (MULTIVITAMIN WITH MINERALS) TABS tablet Take 1 tablet by mouth daily.    . simvastatin (ZOCOR) 10 MG tablet Take 10 mg by mouth at bedtime.      No current facility-administered medications for this visit.    Allergies  Allergen Reactions  . Lipitor [Atorvastatin Calcium] Other (See Comments)    cramping    Social History   Socioeconomic History  . Marital status: Married    Spouse name: Not on file  . Number of children: Not on file  . Years of education: Not on file  . Highest education level: Not on file  Occupational History  . Not on file  Tobacco Use  . Smoking status: Former Smoker    Packs/day: 0.50    Years: 15.00  Pack years: 7.50    Types: Cigarettes    Quit date: 07/21/1971    Years since quitting: 48.8  . Smokeless tobacco: Never Used  Vaping Use  . Vaping Use: Never used  Substance and Sexual Activity  . Alcohol use: Yes    Comment: 2 glasses wine per day  . Drug use: No  . Sexual activity: Not on file  Other Topics Concern  . Not on file  Social History Narrative  . Not on file   Social Determinants of Health   Financial Resource Strain: Not on file  Food Insecurity: Not on file  Transportation Needs: Not on file  Physical Activity: Not on file  Stress: Not on file  Social Connections: Not on file  Intimate Partner Violence: Not on file     Review of Systems: General: No chills, fever, night  sweats or weight changes  Cardiovascular:  No chest pain, dyspnea on exertion, edema, orthopnea, palpitations, paroxysmal nocturnal dyspnea Dermatological: No rash, lesions or masses Respiratory: No cough, dyspnea Urologic: No hematuria, dysuria Abdominal: No nausea, vomiting, diarrhea, bright red blood per rectum, melena, or hematemesis Neurologic: No visual changes, weakness, changes in mental status All other systems reviewed and are otherwise negative except as noted above.  Physical Exam: There were no vitals filed for this visit.  GEN- The patient is well appearing, alert and oriented x 3 today.   HEENT: normocephalic, atraumatic; sclera clear, conjunctiva pink; hearing intact; oropharynx clear; neck supple, no JVP Lymph- no cervical lymphadenopathy Lungs- Clear to ausculation bilaterally, normal work of breathing.  No wheezes, rales, rhonchi Heart- Regular rate and rhythm, no murmurs, rubs or gallops, PMI not laterally displaced GI- soft, non-tender, non-distended, bowel sounds present, no hepatosplenomegaly Extremities- no clubbing, cyanosis, or edema; DP/PT/radial pulses 2+ bilaterally MS- no significant deformity or atrophy Skin- warm and dry, no rash or lesion Psych- euthymic mood, full affect Neuro- strength and sensation are intact  EKG is ordered. Personal review of EKG from today shows ***  Additional studies reviewed include: Previous EP office notes  Assessment and Plan:  1. Persistent atrial fibrillation Continue Eliquis and diltiazem. Failed flecainide with widening QRS CHA2DS2VASC of at least 3.    2. H/o second degree AV block At last visit was in 2:1 AV block and diltiazem was stopped.  EKG today shows ***  Shirley Friar, [place]  05/13/20 1:23 PM

## 2020-05-14 ENCOUNTER — Ambulatory Visit: Payer: Medicare PPO | Admitting: Student

## 2020-05-14 DIAGNOSIS — Z85038 Personal history of other malignant neoplasm of large intestine: Secondary | ICD-10-CM | POA: Diagnosis not present

## 2020-05-14 DIAGNOSIS — K219 Gastro-esophageal reflux disease without esophagitis: Secondary | ICD-10-CM | POA: Diagnosis not present

## 2020-05-14 DIAGNOSIS — N529 Male erectile dysfunction, unspecified: Secondary | ICD-10-CM | POA: Diagnosis not present

## 2020-05-14 DIAGNOSIS — D510 Vitamin B12 deficiency anemia due to intrinsic factor deficiency: Secondary | ICD-10-CM | POA: Diagnosis not present

## 2020-05-14 DIAGNOSIS — I10 Essential (primary) hypertension: Secondary | ICD-10-CM | POA: Diagnosis not present

## 2020-05-14 DIAGNOSIS — I480 Paroxysmal atrial fibrillation: Secondary | ICD-10-CM | POA: Diagnosis not present

## 2020-05-16 ENCOUNTER — Other Ambulatory Visit: Payer: Self-pay

## 2020-05-16 ENCOUNTER — Encounter: Payer: Self-pay | Admitting: Student

## 2020-05-16 ENCOUNTER — Ambulatory Visit: Payer: Medicare PPO | Admitting: Student

## 2020-05-16 VITALS — BP 140/80 | HR 63 | Ht 72.0 in | Wt 172.0 lb

## 2020-05-16 DIAGNOSIS — I4819 Other persistent atrial fibrillation: Secondary | ICD-10-CM | POA: Diagnosis not present

## 2020-05-16 DIAGNOSIS — E785 Hyperlipidemia, unspecified: Secondary | ICD-10-CM | POA: Diagnosis not present

## 2020-05-16 DIAGNOSIS — I10 Essential (primary) hypertension: Secondary | ICD-10-CM | POA: Diagnosis not present

## 2020-05-16 NOTE — Progress Notes (Signed)
PCP:  [name], MD Primary Cardiologist: No primary care provider on file. Electrophysiologist: Will Meredith [name], MD   Brian Horn is a 85 y.o. male seen today for Will Meredith [name], MD for routine electrophysiology followup.  Since last being seen in our clinic the patient reports doing very well. Had PCP visit earlier this week and had a "good checkup". Labs drawn.  he denies chest pain, palpitations, dyspnea, PND, orthopnea, nausea, vomiting, dizziness, syncope, edema, weight gain, or early satiety.  Past Medical History:  Diagnosis Date   Blood dyscrasia    hemachromatosis, takes tx q year   ED (erectile dysfunction)    GERD (gastroesophageal reflux disease)    Glaucoma    right eye   Hematuria, gross    History of kidney stones    Hyperlipidemia    Hypertension    Inguinal hernia    Paroxysmal atrial fibrillation (HCC)    Peyronie's disease    Pulmonic stenosis    mild   Tinnitus    Past Surgical History:  Procedure Laterality Date   APPENDECTOMY     CARDIOVERSION N/A 04/29/2017   Procedure: CARDIOVERSION;  Surgeon: [name], MD;  Location: [facility];  Service: Cardiovascular;  Laterality: N/A;   CHOLECYSTECTOMY     colonscopy     CYSTOSCOPY WITH INSERTION OF UROLIFT N/A 03/15/2017   Procedure: CYSTOSCOPY WITH INSERTION OF UROLIFT;  Surgeon: [name], MD;  Location: [facility];  Service: Urology;  Laterality: N/A;   EYE SURGERY     ioc for cataracts   LAPAROSCOPIC RIGHT COLECTOMY Right 07/11/2017   Procedure: LAPAROSCOPIC RIGHT COLECTOMY, REPAIR LAPAROSCOPIC  ABDOMINAL HERNIA;  Surgeon: Alphonsa Overall, MD;  Location: [facility];  Service: General;  Laterality: Right;    Current Outpatient Medications  Medication Sig Dispense Refill   acetaminophen (TYLENOL) 500 MG tablet Take 500-1,000 mg by mouth every 6 (six) hours as needed (for pain.).     benazepril (LOTENSIN) 20 MG tablet TAKE 1 TABLET BY MOUTH  EVERYDAY AT BEDTIME 90 tablet 1   brimonidine (ALPHAGAN) 0.2 % ophthalmic solution Place 1 drop into both eyes 2 (two) times daily.     Cholecalciferol (VITAMIN D3) 1000 units CAPS Take 1,000 Units by mouth at bedtime.      Cinnamon 500 MG TABS Take 500 mg by mouth at bedtime.      Coenzyme Q10 (COQ10) 150 MG CAPS daily.     ELIQUIS 5 MG TABS tablet TAKE 1 TABLET BY MOUTH TWICE A DAY 180 tablet 1   finasteride (PROSCAR) 5 MG tablet Take 5 mg by mouth daily.     lansoprazole (PREVACID) 15 MG capsule Take 15 mg by mouth daily before breakfast.     latanoprost (XALATAN) 0.005 % ophthalmic solution Place 1 drop into both eyes at bedtime.      Multiple Vitamin (MULTIVITAMIN WITH MINERALS) TABS tablet Take 1 tablet by mouth daily.     Probiotic Product (ALIGN) 4 MG CAPS daily.     sildenafil (VIAGRA) 100 MG tablet as needed.     simvastatin (ZOCOR) 10 MG tablet Take 10 mg by mouth at bedtime.      No current facility-administered medications for this visit.    Allergies  Allergen Reactions   Lipitor [Atorvastatin Calcium] Other (See Comments)    cramping    Social History   Socioeconomic History   Marital status: Married    Spouse name: Not on file   Number of children: Not on  file   Years of education: Not on file   Highest education level: Not on file  Occupational History   Not on file  Tobacco Use   Smoking status: Former Smoker    Packs/day: 0.50    Years: 15.00    Pack years: 7.50    Types: Cigarettes    Quit date: 07/21/1971    Years since quitting: 48.8   Smokeless tobacco: Never Used  Vaping Use   Vaping Use: Never used  Substance and Sexual Activity   Alcohol use: Yes    Comment: 2 glasses wine per day   Drug use: No   Sexual activity: Not on file  Other Topics Concern   Not on file  Social History Narrative   Not on file   Social Determinants of Health   Financial Resource Strain: Not on file  Food Insecurity: Not on file   Transportation Needs: Not on file  Physical Activity: Not on file  Stress: Not on file  Social Connections: Not on file  Intimate Partner Violence: Not on file     Review of Systems: All other systems reviewed and are otherwise negative except as noted above.  Physical Exam: Vitals:   05/16/20 1243  BP: 140/80  Pulse: 63  SpO2: 98%  Weight: 172 lb (78 kg)  Height: 6' (1.829 m)    GEN- The patient is well appearing, alert and oriented x 3 today.   HEENT: normocephalic, atraumatic; sclera clear, conjunctiva pink; hearing intact; oropharynx clear; neck supple, no JVP Lymph- no cervical lymphadenopathy Lungs- Clear to ausculation bilaterally, normal work of breathing.  No wheezes, rales, rhonchi Heart- Regular rate and rhythm, no murmurs, rubs or gallops, PMI not laterally displaced GI- soft, non-tender, non-distended, bowel sounds present, no hepatosplenomegaly Extremities- no clubbing, cyanosis, or edema; DP/PT/radial pulses 2+ bilaterally MS- no significant deformity or atrophy Skin- warm and dry, no rash or lesion Psych- euthymic mood, full affect Neuro- strength and sensation are intact  EKG is ordered. Personal review of EKG from today shows NSR at 63 bpm, with short PR interval at 116 ms, QRS 124 ms  Additional studies reviewed include: Previous EKG and EP office notes  Assessment and Plan:  1. Persistent atrial fibrillation Continue eliquis for CHA2DS2VASC of at least 3.   He had QRS widening on flecainide, and 2:1 AV block on diltiazem.   He is in NSR today with a normal to short PR interval.  2. HTN Slightly elevated. He states was 120s at PCP visit earlier this week.  Avoid AV nodal blockade as above At home 120-130s, so he would like to continue current dose of ACEi. He says he will call if over 130-140 with any regularity.  Sodium restriction stressed.   3. HLD Continue simvastatin per primary care.  Shirley Friar, PA-C  05/16/20 12:55 PM

## 2020-05-16 NOTE — Patient Instructions (Signed)
Medication Instructions: Your physician recommends that you continue on your current medications as directed. Please refer to the Current Medication list given to you today.  *If you need a refill on your cardiac medications before your next appointment, please call your pharmacy*   Lab Work: None ordered   If you have labs (blood work) drawn today and your tests are completely normal, you will receive your results only by: [name] MyChart Message (if you have MyChart) OR . A paper copy in the mail If you have any lab test that is abnormal or we need to change your treatment, we will call you to review the results.   Testing/Procedures: None ordered    Follow-Up: At [facility], you and your health needs are our priority.  As part of our continuing mission to provide you with exceptional heart care, we have created designated Provider Care Teams.  These Care Teams include your primary Cardiologist (physician) and Advanced Practice Providers (APPs -  Physician Assistants and Nurse Practitioners) who all work together to provide you with the care you need, when you need it.  We recommend signing up for the patient portal called "MyChart".  Sign up information is provided on this After Visit Summary.  MyChart is used to connect with patients for Virtual Visits (Telemedicine).  Patients are able to view lab/test results, encounter notes, upcoming appointments, etc.  Non-urgent messages can be sent to your provider as well.   To learn more about what you can do with MyChart, go to [email or web address].    Your next appointment:   6 month(s)  The format for your next appointment:   In Person  Provider:   You may see Will Meredith [name], MD or one of the following Advanced Practice Providers on your designated Care Team:    Chanetta Marshall, NP  Tommye Standard, PA-C  Legrand Como "Jonni Sanger" [place], [place]    Other Instructions None

## 2020-05-21 NOTE — Addendum Note (Signed)
Addended by: Jeremy Johann on: 05/21/2020 02:53 PM   Modules accepted: Orders

## 2020-05-22 DIAGNOSIS — L570 Actinic keratosis: Secondary | ICD-10-CM | POA: Diagnosis not present

## 2020-05-22 DIAGNOSIS — D1801 Hemangioma of skin and subcutaneous tissue: Secondary | ICD-10-CM | POA: Diagnosis not present

## 2020-05-22 DIAGNOSIS — Z85820 Personal history of malignant melanoma of skin: Secondary | ICD-10-CM | POA: Diagnosis not present

## 2020-05-22 DIAGNOSIS — L821 Other seborrheic keratosis: Secondary | ICD-10-CM | POA: Diagnosis not present

## 2020-05-22 DIAGNOSIS — L814 Other melanin hyperpigmentation: Secondary | ICD-10-CM | POA: Diagnosis not present

## 2020-05-22 DIAGNOSIS — Z85828 Personal history of other malignant neoplasm of skin: Secondary | ICD-10-CM | POA: Diagnosis not present

## 2020-05-22 DIAGNOSIS — D225 Melanocytic nevi of trunk: Secondary | ICD-10-CM | POA: Diagnosis not present

## 2020-05-23 DIAGNOSIS — I480 Paroxysmal atrial fibrillation: Secondary | ICD-10-CM | POA: Diagnosis not present

## 2020-05-23 DIAGNOSIS — K219 Gastro-esophageal reflux disease without esophagitis: Secondary | ICD-10-CM | POA: Diagnosis not present

## 2020-05-23 DIAGNOSIS — I10 Essential (primary) hypertension: Secondary | ICD-10-CM | POA: Diagnosis not present

## 2020-05-23 DIAGNOSIS — H409 Unspecified glaucoma: Secondary | ICD-10-CM | POA: Diagnosis not present

## 2020-05-23 DIAGNOSIS — Z85038 Personal history of other malignant neoplasm of large intestine: Secondary | ICD-10-CM | POA: Diagnosis not present

## 2020-05-23 DIAGNOSIS — N401 Enlarged prostate with lower urinary tract symptoms: Secondary | ICD-10-CM | POA: Diagnosis not present

## 2020-05-23 DIAGNOSIS — D510 Vitamin B12 deficiency anemia due to intrinsic factor deficiency: Secondary | ICD-10-CM | POA: Diagnosis not present

## 2020-05-23 DIAGNOSIS — E7800 Pure hypercholesterolemia, unspecified: Secondary | ICD-10-CM | POA: Diagnosis not present

## 2020-05-26 DIAGNOSIS — E538 Deficiency of other specified B group vitamins: Secondary | ICD-10-CM | POA: Diagnosis not present

## 2020-06-20 DIAGNOSIS — N401 Enlarged prostate with lower urinary tract symptoms: Secondary | ICD-10-CM | POA: Diagnosis not present

## 2020-06-20 DIAGNOSIS — R351 Nocturia: Secondary | ICD-10-CM | POA: Diagnosis not present

## 2020-06-20 DIAGNOSIS — N5201 Erectile dysfunction due to arterial insufficiency: Secondary | ICD-10-CM | POA: Diagnosis not present

## 2020-06-26 DIAGNOSIS — E538 Deficiency of other specified B group vitamins: Secondary | ICD-10-CM | POA: Diagnosis not present

## 2020-07-23 DIAGNOSIS — H401133 Primary open-angle glaucoma, bilateral, severe stage: Secondary | ICD-10-CM | POA: Diagnosis not present

## 2020-07-29 DIAGNOSIS — E538 Deficiency of other specified B group vitamins: Secondary | ICD-10-CM | POA: Diagnosis not present

## 2020-08-08 ENCOUNTER — Other Ambulatory Visit: Payer: Self-pay | Admitting: Cardiology

## 2020-08-28 ENCOUNTER — Other Ambulatory Visit: Payer: Self-pay | Admitting: Cardiology

## 2020-08-28 NOTE — Telephone Encounter (Signed)
Prescription refill request for Eliquis received.  Indication: afib  Last office visit: [place], 05/16/2020 Scr: 1.0 11/12/2019 Age: 85 yo  Weight: 78 kg   Pt is on the correct dose of Eliquis per dosing criteria, prescription refill sent for Eliquis 5mg  BID.

## 2020-08-29 DIAGNOSIS — D510 Vitamin B12 deficiency anemia due to intrinsic factor deficiency: Secondary | ICD-10-CM | POA: Diagnosis not present

## 2020-09-10 DIAGNOSIS — H401113 Primary open-angle glaucoma, right eye, severe stage: Secondary | ICD-10-CM | POA: Diagnosis not present

## 2020-09-17 DIAGNOSIS — H401123 Primary open-angle glaucoma, left eye, severe stage: Secondary | ICD-10-CM | POA: Diagnosis not present

## 2020-09-29 DIAGNOSIS — E538 Deficiency of other specified B group vitamins: Secondary | ICD-10-CM | POA: Diagnosis not present

## 2020-10-07 ENCOUNTER — Telehealth: Payer: Self-pay | Admitting: Cardiology

## 2020-10-07 NOTE — Telephone Encounter (Signed)
Pt advised to contact PCP to further discuss. Pt reports only a couple of elevated recordings. Feels light headed when elevated, highest SBP was 163 Advised that if only one/two elevated numbers, to keep an eye on it.  Advised if consistently staying above 130/80 to call PCP Patient verbalized understanding and agreeable to plan.

## 2020-10-07 NOTE — Telephone Encounter (Signed)
Pt c/o BP issue:  1. What are your last 5 BP readings? 163/88 hr 49 130/82 hr 62  2. Are you having any other symptoms (ex. Dizziness, headache, blurred vision, passed out)? Dizziness, tiredness    Made patient appt for 6/23 at 10:30am

## 2020-10-16 ENCOUNTER — Ambulatory Visit: Payer: Medicare PPO | Admitting: Student

## 2020-10-21 NOTE — Progress Notes (Signed)
PCP:  [name], MD Primary Cardiologist: None Electrophysiologist: Will Meredith [name], MD   Brian Horn is a 85 y.o. male seen today for Will Meredith [name], MD for acute visit due to elevated blood pressure .  Since last being seen in our clinic the patient reports doing OK. He has noted increasing incidence of lightheadedness over the past month. Along with this, he has HRs at home in the 30-60 range. He is more dizzy when they are in the 30 range, and this has happened gradually more often in the past month by his records. He has had no syncope. His BP has been relatively well controlled.  he denies chest pain, palpitations, dyspnea, PND, orthopnea, nausea, vomiting,  syncope, edema, weight gain, or early satiety.  Past Medical History:  Diagnosis Date   Blood dyscrasia    hemachromatosis, takes tx q year   ED (erectile dysfunction)    GERD (gastroesophageal reflux disease)    Glaucoma    right eye   Hematuria, gross    History of kidney stones    Hyperlipidemia    Hypertension    Inguinal hernia    Paroxysmal atrial fibrillation (HCC)    Peyronie's disease    Pulmonic stenosis    mild   Tinnitus    Past Surgical History:  Procedure Laterality Date   APPENDECTOMY     CARDIOVERSION N/A 04/29/2017   Procedure: CARDIOVERSION;  Surgeon: [name], MD;  Location: [facility];  Service: Cardiovascular;  Laterality: N/A;   CHOLECYSTECTOMY     colonscopy     CYSTOSCOPY WITH INSERTION OF UROLIFT N/A 03/15/2017   Procedure: CYSTOSCOPY WITH INSERTION OF UROLIFT;  Surgeon: [name], MD;  Location: [facility];  Service: Urology;  Laterality: N/A;   EYE SURGERY     ioc for cataracts   LAPAROSCOPIC RIGHT COLECTOMY Right 07/11/2017   Procedure: LAPAROSCOPIC RIGHT COLECTOMY, REPAIR LAPAROSCOPIC  ABDOMINAL HERNIA;  Surgeon: Alphonsa Overall, MD;  Location: [facility];  Service: General;  Laterality: Right;    Current Outpatient Medications  Medication Sig  Dispense Refill   acetaminophen (TYLENOL) 500 MG tablet Take 500-1,000 mg by mouth every 6 (six) hours as needed (for pain.).     benazepril (LOTENSIN) 20 MG tablet TAKE 1 TABLET BY MOUTH EVERYDAY AT BEDTIME 90 tablet 3   brimonidine (ALPHAGAN) 0.2 % ophthalmic solution Place 1 drop into both eyes 2 (two) times daily.     Cholecalciferol (VITAMIN D3) 1000 units CAPS Take 1,000 Units by mouth at bedtime.      Cinnamon 500 MG TABS Take 500 mg by mouth at bedtime.      Coenzyme Q10 (COQ10) 150 MG CAPS daily.     ELIQUIS 5 MG TABS tablet TAKE 1 TABLET BY MOUTH TWICE A DAY 180 tablet 1   finasteride (PROSCAR) 5 MG tablet Take 5 mg by mouth daily.     lansoprazole (PREVACID) 15 MG capsule Take 15 mg by mouth daily before breakfast.     latanoprost (XALATAN) 0.005 % ophthalmic solution Place 1 drop into both eyes at bedtime.      Multiple Vitamin (MULTIVITAMIN WITH MINERALS) TABS tablet Take 1 tablet by mouth daily.     Probiotic Product (ALIGN) 4 MG CAPS daily.     sildenafil (VIAGRA) 100 MG tablet as needed.     simvastatin (ZOCOR) 10 MG tablet Take 10 mg by mouth at bedtime.      No current facility-administered medications for this visit.  Allergies  Allergen Reactions   Lipitor [Atorvastatin Calcium] Other (See Comments)    cramping    Social History   Socioeconomic History   Marital status: Married    Spouse name: Not on file   Number of children: Not on file   Years of education: Not on file   Highest education level: Not on file  Occupational History   Not on file  Tobacco Use   Smoking status: Former    Packs/day: 0.50    Years: 15.00    Pack years: 7.50    Types: Cigarettes    Quit date: 07/21/1971    Years since quitting: 49.3   Smokeless tobacco: Never  Vaping Use   Vaping Use: Never used  Substance and Sexual Activity   Alcohol use: Yes    Comment: 2 glasses wine per day   Drug use: No   Sexual activity: Not on file  Other Topics Concern   Not on file   Social History Narrative   Not on file   Social Determinants of Health   Financial Resource Strain: Not on file  Food Insecurity: Not on file  Transportation Needs: Not on file  Physical Activity: Not on file  Stress: Not on file  Social Connections: Not on file  Intimate Partner Violence: Not on file     Review of Systems: General: No chills, fever, night sweats or weight changes  Cardiovascular:  No chest pain, dyspnea on exertion, edema, orthopnea, palpitations, paroxysmal nocturnal dyspnea Dermatological: No rash, lesions or masses Respiratory: No cough, dyspnea Urologic: No hematuria, dysuria Abdominal: No nausea, vomiting, diarrhea, bright red blood per rectum, melena, or hematemesis Neurologic: No visual changes, weakness, changes in mental status All other systems reviewed and are otherwise negative except as noted above.  Physical Exam: Vitals:   10/28/20 0928  BP: 112/60  Pulse: (!) 35  SpO2: 98%  Weight: 172 lb (78 kg)  Height: 6' (1.829 m)    GEN- The patient is well appearing, alert and oriented x 3 today.   HEENT: normocephalic, atraumatic; sclera clear, conjunctiva pink; hearing intact; oropharynx clear; neck supple, no JVP Lymph- no cervical lymphadenopathy Lungs- Clear to ausculation bilaterally, normal work of breathing.  No wheezes, rales, rhonchi Heart- Regular rate and rhythm, no murmurs, rubs or gallops, PMI not laterally displaced GI- soft, non-tender, non-distended, bowel sounds present, no hepatosplenomegaly Extremities- no clubbing, cyanosis, or edema; DP/PT/radial pulses 2+ bilaterally MS- no significant deformity or atrophy Skin- warm and dry, no rash or lesion Psych- euthymic mood, full affect Neuro- strength and sensation are intact  EKG is ordered. Personal review of EKG from today shows at least 2:1 AV block at 36 bpm and a PVC  Additional studies reviewed include: Previous EP office visits  Assessment and Plan:  1. Persistent  atrial fibrillation Continue eliquis for CHA2DS2-VASc of at least 3.  He previously had QRS widening on flecainide and 2:1 AV block on diltiazem.    He is in NSR today with 2:1 AV block.  Last QRS complex on the EKG has a different p wave morphology. This could also be CHB with junctional rhythm.  2. 2:1 AV block This is the second documentation, previously he was documented while he was on diltiazem. He has no reversible cause, and appears to have increasing symptoms. Explained risks, benefits, and alternatives to PPM implantation, including but not limited to bleeding, infection, pneumothorax, pericardial effusion, lead dislodgement, heart attack, stroke, or death.  Pt verbalized understanding and agrees to  proceed.   3. HTN Echo 08/2015 LVEF 65-70%  Will update this to make sure no additional considerations need to be made (I.e. CRT). There was a drop off and this can be done tomorrow.   4. HLD Per PCP.  Labs today. Discussed case with Dr. Quentin Ore. With intermittent 2:1 AV block, will update Echo and obtain labwork and plan for outpatient PPM with Dr. Curt Bears next available.  If patient has worsening symptoms would have him go to ER for more urgent placement.  Pt understands to call 911 immediately with any frank syncope.  Pt agrees with plan  Shirley Friar, PA-C  10/28/20 9:49 AM

## 2020-10-28 ENCOUNTER — Encounter: Payer: Self-pay | Admitting: Student

## 2020-10-28 ENCOUNTER — Ambulatory Visit: Payer: Medicare PPO | Admitting: Student

## 2020-10-28 ENCOUNTER — Other Ambulatory Visit: Payer: Self-pay

## 2020-10-28 VITALS — BP 112/60 | HR 35 | Ht 72.0 in | Wt 172.0 lb

## 2020-10-28 DIAGNOSIS — I442 Atrioventricular block, complete: Secondary | ICD-10-CM

## 2020-10-28 DIAGNOSIS — I4819 Other persistent atrial fibrillation: Secondary | ICD-10-CM

## 2020-10-28 DIAGNOSIS — I10 Essential (primary) hypertension: Secondary | ICD-10-CM | POA: Diagnosis not present

## 2020-10-28 DIAGNOSIS — E785 Hyperlipidemia, unspecified: Secondary | ICD-10-CM | POA: Diagnosis not present

## 2020-10-28 LAB — CBC
Hematocrit: 41.2 % (ref 37.5–51.0)
Hemoglobin: 13.9 g/dL (ref 13.0–17.7)
MCH: 33.9 pg — ABNORMAL HIGH (ref 26.6–33.0)
MCHC: 33.7 g/dL (ref 31.5–35.7)
MCV: 101 fL — ABNORMAL HIGH (ref 79–97)
Platelets: 146 10*3/uL — ABNORMAL LOW (ref 150–450)
RBC: 4.1 x10E6/uL — ABNORMAL LOW (ref 4.14–5.80)
RDW: 13.1 % (ref 11.6–15.4)
WBC: 6.9 10*3/uL (ref 3.4–10.8)

## 2020-10-28 LAB — BASIC METABOLIC PANEL
BUN/Creatinine Ratio: 13 (ref 10–24)
BUN: 14 mg/dL (ref 8–27)
CO2: 24 mmol/L (ref 20–29)
Calcium: 9.5 mg/dL (ref 8.6–10.2)
Chloride: 105 mmol/L (ref 96–106)
Creatinine, Ser: 1.04 mg/dL (ref 0.76–1.27)
Glucose: 129 mg/dL — ABNORMAL HIGH (ref 65–99)
Potassium: 4.1 mmol/L (ref 3.5–5.2)
Sodium: 142 mmol/L (ref 134–144)
eGFR: 71 mL/min/{1.73_m2} (ref >59)

## 2020-10-28 LAB — TSH: TSH: 2.22 u[IU]/mL (ref 0.450–4.500)

## 2020-10-28 NOTE — Patient Instructions (Signed)
Medication Instructions:  Your physician recommends that you continue on your current medications as directed. Please refer to the Current Medication list given to you today.  *If you need a refill on your cardiac medications before your next appointment, please call your pharmacy*   Lab Work: TODAY: BMET, CBC, TSH  If you have labs (blood work) drawn today and your tests are completely normal, you will receive your results only by: [place] (if you have MyChart) OR A paper copy in the mail If you have any lab test that is abnormal or we need to change your treatment, we will call you to review the results.   Testing/Procedures: Your physician has requested that you have an echocardiogram. Echocardiography is a painless test that uses sound waves to create images of your heart. It provides your doctor with information about the size and shape of your heart and how well your heart's chambers and valves are working. This procedure takes approximately one hour. There are no restrictions for this procedure.   Follow-Up: At [facility], you and your health needs are our priority.  As part of our continuing mission to provide you with exceptional heart care, we have created designated Provider Care Teams.  These Care Teams include your primary Cardiologist (physician) and Advanced Practice Providers (APPs -  Physician Assistants and Nurse Practitioners) who all work together to provide you with the care you need, when you need it.  We recommend signing up for the patient portal called "MyChart".  Sign up information is provided on this After Visit Summary.  MyChart is used to connect with patients for Virtual Visits (Telemedicine).  Patients are able to view lab/test results, encounter notes, upcoming appointments, etc.  Non-urgent messages can be sent to your provider as well.   To learn more about what you can do with MyChart, go to [email or web address].

## 2020-10-29 ENCOUNTER — Ambulatory Visit (HOSPITAL_COMMUNITY): Payer: Medicare PPO | Attending: Interventional Cardiology

## 2020-10-29 DIAGNOSIS — I442 Atrioventricular block, complete: Secondary | ICD-10-CM | POA: Diagnosis not present

## 2020-10-29 LAB — ECHOCARDIOGRAM COMPLETE
Area-P 1/2: 3 cm2
S' Lateral: 3.7 cm

## 2020-10-31 DIAGNOSIS — E538 Deficiency of other specified B group vitamins: Secondary | ICD-10-CM | POA: Diagnosis not present

## 2020-11-03 NOTE — Addendum Note (Signed)
Addended by: Jacinta Shoe on: 11/03/2020 08:25 AM   Modules accepted: Orders

## 2020-11-11 ENCOUNTER — Telehealth: Payer: Self-pay | Admitting: Cardiology

## 2020-11-11 DIAGNOSIS — I442 Atrioventricular block, complete: Secondary | ICD-10-CM

## 2020-11-11 DIAGNOSIS — Z01812 Encounter for preprocedural laboratory examination: Secondary | ICD-10-CM

## 2020-11-11 NOTE — Telephone Encounter (Signed)
Pt would like to go for PPM implant 8/17. Aware I will be in touch to review instructions, etc. Patient verbalized understanding and agreeable to plan.

## 2020-11-11 NOTE — Telephone Encounter (Signed)
Patient states he was supposed to get a pacemaker put in.He is not sure when that appointment will be scheduled. Please call to discuss

## 2020-11-28 NOTE — Telephone Encounter (Signed)
Reviewed procedure instructions with pt Pt will stop by the office on 8/22 for pre procedure labs. Aware to pick up instruction letter then. Patient verbalized understanding and agreeable to plan.

## 2020-12-02 DIAGNOSIS — E538 Deficiency of other specified B group vitamins: Secondary | ICD-10-CM | POA: Diagnosis not present

## 2020-12-10 ENCOUNTER — Encounter: Payer: Self-pay | Admitting: *Deleted

## 2020-12-11 DIAGNOSIS — Z1389 Encounter for screening for other disorder: Secondary | ICD-10-CM | POA: Diagnosis not present

## 2020-12-11 DIAGNOSIS — Z85038 Personal history of other malignant neoplasm of large intestine: Secondary | ICD-10-CM | POA: Diagnosis not present

## 2020-12-11 DIAGNOSIS — E538 Deficiency of other specified B group vitamins: Secondary | ICD-10-CM | POA: Diagnosis not present

## 2020-12-11 DIAGNOSIS — E7800 Pure hypercholesterolemia, unspecified: Secondary | ICD-10-CM | POA: Diagnosis not present

## 2020-12-11 DIAGNOSIS — D510 Vitamin B12 deficiency anemia due to intrinsic factor deficiency: Secondary | ICD-10-CM | POA: Diagnosis not present

## 2020-12-11 DIAGNOSIS — I480 Paroxysmal atrial fibrillation: Secondary | ICD-10-CM | POA: Diagnosis not present

## 2020-12-11 DIAGNOSIS — K219 Gastro-esophageal reflux disease without esophagitis: Secondary | ICD-10-CM | POA: Diagnosis not present

## 2020-12-11 DIAGNOSIS — Z0000 Encounter for general adult medical examination without abnormal findings: Secondary | ICD-10-CM | POA: Diagnosis not present

## 2020-12-11 DIAGNOSIS — I442 Atrioventricular block, complete: Secondary | ICD-10-CM | POA: Diagnosis not present

## 2020-12-11 DIAGNOSIS — I10 Essential (primary) hypertension: Secondary | ICD-10-CM | POA: Diagnosis not present

## 2020-12-15 ENCOUNTER — Other Ambulatory Visit: Payer: Medicare PPO | Admitting: *Deleted

## 2020-12-15 ENCOUNTER — Other Ambulatory Visit: Payer: Self-pay

## 2020-12-15 DIAGNOSIS — I442 Atrioventricular block, complete: Secondary | ICD-10-CM

## 2020-12-15 DIAGNOSIS — Z01812 Encounter for preprocedural laboratory examination: Secondary | ICD-10-CM

## 2020-12-15 LAB — BASIC METABOLIC PANEL
BUN/Creatinine Ratio: 17 (ref 10–24)
BUN: 15 mg/dL (ref 8–27)
CO2: 19 mmol/L — ABNORMAL LOW (ref 20–29)
Calcium: 9.7 mg/dL (ref 8.6–10.2)
Chloride: 104 mmol/L (ref 96–106)
Creatinine, Ser: 0.9 mg/dL (ref 0.76–1.27)
Glucose: 128 mg/dL — ABNORMAL HIGH (ref 65–99)
Potassium: 4.2 mmol/L (ref 3.5–5.2)
Sodium: 138 mmol/L (ref 134–144)
eGFR: 85 mL/min/{1.73_m2} (ref >59)

## 2020-12-15 LAB — CBC
Hematocrit: 40.3 % (ref 37.5–51.0)
Hemoglobin: 13.9 g/dL (ref 13.0–17.7)
MCH: 33.7 pg — ABNORMAL HIGH (ref 26.6–33.0)
MCHC: 34.5 g/dL (ref 31.5–35.7)
MCV: 98 fL — ABNORMAL HIGH (ref 79–97)
Platelets: 218 10*3/uL (ref 150–450)
RBC: 4.12 x10E6/uL — ABNORMAL LOW (ref 4.14–5.80)
RDW: 12.4 % (ref 11.6–15.4)
WBC: 8.6 10*3/uL (ref 3.4–10.8)

## 2020-12-16 NOTE — Pre-Procedure Instructions (Signed)
Instructed patient on the following items: Arrival time 1230 Nothing to eat or drink after midnight No meds AM of procedure Responsible person to drive you home and stay with you for 24 hrs Wash with special soap night before and morning of procedure If on anti-coagulant drug instructions Eliquis-don't take any in the morning.

## 2020-12-17 ENCOUNTER — Ambulatory Visit (HOSPITAL_COMMUNITY)
Admission: RE | Admit: 2020-12-17 | Discharge: 2020-12-17 | Disposition: A | Discharge status: Home / Self Care | Payer: Medicare PPO | Attending: Cardiology | Admitting: Cardiology

## 2020-12-17 ENCOUNTER — Encounter (HOSPITAL_COMMUNITY)
Admission: RE | Disposition: A | Discharge status: Home / Self Care | Payer: Self-pay | Source: Home / Self Care | Attending: Cardiology

## 2020-12-17 ENCOUNTER — Other Ambulatory Visit: Payer: Self-pay

## 2020-12-17 ENCOUNTER — Ambulatory Visit (HOSPITAL_COMMUNITY): Payer: Medicare PPO

## 2020-12-17 DIAGNOSIS — I4819 Other persistent atrial fibrillation: Secondary | ICD-10-CM | POA: Diagnosis not present

## 2020-12-17 DIAGNOSIS — Z87891 Personal history of nicotine dependence: Secondary | ICD-10-CM | POA: Insufficient documentation

## 2020-12-17 DIAGNOSIS — I441 Atrioventricular block, second degree: Secondary | ICD-10-CM | POA: Diagnosis not present

## 2020-12-17 DIAGNOSIS — Z7901 Long term (current) use of anticoagulants: Secondary | ICD-10-CM | POA: Insufficient documentation

## 2020-12-17 DIAGNOSIS — Z888 Allergy status to other drugs, medicaments and biological substances status: Secondary | ICD-10-CM | POA: Diagnosis not present

## 2020-12-17 DIAGNOSIS — J849 Interstitial pulmonary disease, unspecified: Secondary | ICD-10-CM | POA: Diagnosis not present

## 2020-12-17 DIAGNOSIS — Z950 Presence of cardiac pacemaker: Secondary | ICD-10-CM | POA: Diagnosis not present

## 2020-12-17 DIAGNOSIS — Z95818 Presence of other cardiac implants and grafts: Secondary | ICD-10-CM

## 2020-12-17 HISTORY — PX: PACEMAKER IMPLANT: EP1218

## 2020-12-17 SURGERY — PACEMAKER IMPLANT

## 2020-12-17 MED ORDER — MIDAZOLAM HCL 5 MG/5ML IJ SOLN
INTRAMUSCULAR | Status: AC
  Filled 2020-12-17: qty 5

## 2020-12-17 MED ORDER — FENTANYL CITRATE (PF) 100 MCG/2ML IJ SOLN
INTRAMUSCULAR | Status: DC | PRN
  Administered 2020-12-17: 25 ug via INTRAVENOUS

## 2020-12-17 MED ORDER — HEPARIN (PORCINE) IN NACL 1000-0.9 UT/500ML-% IV SOLN
INTRAVENOUS | Status: DC | PRN
  Administered 2020-12-17: 500 mL

## 2020-12-17 MED ORDER — CEFAZOLIN SODIUM-DEXTROSE 2-4 GM/100ML-% IV SOLN
INTRAVENOUS | Status: AC
  Filled 2020-12-17: qty 100

## 2020-12-17 MED ORDER — CEFAZOLIN SODIUM-DEXTROSE 2-4 GM/100ML-% IV SOLN
2.0000 g | INTRAVENOUS | Status: AC
  Administered 2020-12-17: 2 g via INTRAVENOUS

## 2020-12-17 MED ORDER — SODIUM CHLORIDE 0.9 % IV SOLN: INTRAVENOUS | Status: DC

## 2020-12-17 MED ORDER — CHLORHEXIDINE GLUCONATE 4 % EX LIQD
60.0000 mL | Freq: Once | CUTANEOUS | Status: DC
  Filled 2020-12-17: qty 60

## 2020-12-17 MED ORDER — HEPARIN (PORCINE) IN NACL 1000-0.9 UT/500ML-% IV SOLN
INTRAVENOUS | Status: AC
  Filled 2020-12-17: qty 500

## 2020-12-17 MED ORDER — CEFAZOLIN SODIUM-DEXTROSE 1-4 GM/50ML-% IV SOLN
1.0000 g | Freq: Four times a day (QID) | INTRAVENOUS | Status: DC
Start: 2020-12-17 — End: 2020-12-18
  Filled 2020-12-17: qty 50

## 2020-12-17 MED ORDER — FENTANYL CITRATE (PF) 100 MCG/2ML IJ SOLN
INTRAMUSCULAR | Status: AC
  Filled 2020-12-17: qty 2

## 2020-12-17 MED ORDER — SODIUM CHLORIDE 0.9 % IV SOLN
INTRAVENOUS | Status: AC
  Filled 2020-12-17: qty 2

## 2020-12-17 MED ORDER — MIDAZOLAM HCL 5 MG/5ML IJ SOLN
INTRAMUSCULAR | Status: DC | PRN
  Administered 2020-12-17: 1 mg via INTRAVENOUS

## 2020-12-17 MED ORDER — ACETAMINOPHEN 325 MG PO TABS
325.0000 mg | ORAL_TABLET | ORAL | Status: DC | PRN
  Administered 2020-12-17: 650 mg via ORAL
  Filled 2020-12-17 (×3): qty 2

## 2020-12-17 MED ORDER — LIDOCAINE HCL (PF) 1 % IJ SOLN
INTRAMUSCULAR | Status: AC
  Filled 2020-12-17: qty 60

## 2020-12-17 MED ORDER — CEFAZOLIN SODIUM-DEXTROSE 1-4 GM/50ML-% IV SOLN
INTRAVENOUS | Status: AC
  Administered 2020-12-17: 1 g via INTRAVENOUS
  Filled 2020-12-17: qty 50

## 2020-12-17 MED ORDER — SODIUM CHLORIDE 0.9 % IV SOLN
80.0000 mg | INTRAVENOUS | Status: AC
  Administered 2020-12-17: 80 mg

## 2020-12-17 MED ORDER — HYDRALAZINE HCL 20 MG/ML IJ SOLN
10.0000 mg | Freq: Once | INTRAMUSCULAR | Status: AC
  Administered 2020-12-17: 10 mg via INTRAVENOUS

## 2020-12-17 MED ORDER — HYDRALAZINE HCL 20 MG/ML IJ SOLN
10.0000 mg | Freq: Once | INTRAMUSCULAR | Status: AC
  Administered 2020-12-17: 10 mg via INTRAVENOUS
  Filled 2020-12-17: qty 1

## 2020-12-17 MED ORDER — LIDOCAINE HCL (PF) 1 % IJ SOLN
INTRAMUSCULAR | Status: DC | PRN
  Administered 2020-12-17: 60 mL

## 2020-12-17 MED ORDER — ONDANSETRON HCL 4 MG/2ML IJ SOLN: 4.0000 mg | Freq: Four times a day (QID) | INTRAMUSCULAR | Status: DC | PRN

## 2020-12-17 SURGICAL SUPPLY — 8 items
CABLE SURGICAL S-101-97-12 (CABLE) ×3 IMPLANT
IPG PACE AZUR XT DR MRI W1DR01 (Pacemaker) IMPLANT
LEAD CAPSURE NOVUS 5076-52CM (Lead) ×2 IMPLANT
LEAD CAPSURE NOVUS 5076-58CM (Lead) ×2 IMPLANT
PACE AZURE XT DR MRI W1DR01 (Pacemaker) ×3 IMPLANT
PAD PRO RADIOLUCENT 2001M-C (PAD) ×3 IMPLANT
SHEATH 7FR PRELUDE SNAP 13 (SHEATH) ×4 IMPLANT
TRAY PACEMAKER INSERTION (PACKS) ×3 IMPLANT

## 2020-12-17 NOTE — Discharge Instructions (Addendum)
    Supplemental Discharge Instructions for  Pacemaker/Defibrillator Patients  Tomorrow, 12/18/20, send in a device transmission  Activity No heavy lifting or vigorous activity with your left arm for 6 to 8 weeks.  Do not raise your left arm above your head for one week.  Gradually raise your affected arm as drawn below.              12/22/20                   12/23/20                   12/24/20                  12/25/20               __  NO DRIVING until your wound check visit.  WOUND CARE Keep the wound area clean and dry.  Do not get this area wet , no showers until cleared to at your wound check visit. Tomorrow, 12/18/20, remove the arm sling Tomorrow, 12/18/20 remove the outer plastic bandage.  Underneath the plastic bandage there are steri strips (paper tapes), DO NOT remove these. The tape/steri-strips on your wound will fall off; do not pull them off.  No bandage is needed on the site.  DO  NOT apply any creams, oils, or ointments to the wound area. If you notice any drainage or discharge from the wound, any swelling or bruising at the site, or you develop a fever > 101? F after you are discharged home, call the office at once.  Special Instructions You are still able to use cellular telephones; use the ear opposite the side where you have your pacemaker/defibrillator.  Avoid carrying your cellular phone near your device. When traveling through airports, show security personnel your identification card to avoid being screened in the metal detectors.  Ask the security personnel to use the hand wand. Avoid arc welding equipment, MRI testing (magnetic resonance imaging), TENS units (transcutaneous nerve stimulators).  Call the office for questions about other devices. Avoid electrical appliances that are in poor condition or are not properly grounded. Microwave ovens are safe to be near or to operate.

## 2020-12-17 NOTE — H&P (Signed)
PCP:  [name], MD Primary Cardiologist: None Electrophysiologist: Brinsley Wence Meredith [name], MD   Brian Horn is a 85 y.o. male seen today for Brian Marsch Meredith [name], MD for acute visit due to elevated blood pressure .  Since last being seen in our clinic the patient reports doing OK. He has noted increasing incidence of lightheadedness over the past month. Along with this, he has HRs at home in the 30-60 range. He is more dizzy when they are in the 30 range, and this has happened gradually more often in the past month by his records. He has had no syncope. His BP has been relatively well controlled.  he denies chest pain, palpitations, dyspnea, PND, orthopnea, nausea, vomiting,  syncope, edema, weight gain, or early satiety.   Today, denies symptoms of palpitations, chest pain, shortness of breath, orthopnea, PND, lower extremity edema, claudication, dizziness, presyncope, syncope, bleeding, or neurologic sequela. The patient is tolerating medications without difficulties. Plan pacemaker today.   Past Medical History:  Diagnosis Date   Blood dyscrasia    hemachromatosis, takes tx q year   ED (erectile dysfunction)    GERD (gastroesophageal reflux disease)    Glaucoma    right eye   Hematuria, gross    History of kidney stones    Hyperlipidemia    Hypertension    Inguinal hernia    Paroxysmal atrial fibrillation (HCC)    Peyronie's disease    Pulmonic stenosis    mild   Tinnitus    Past Surgical History:  Procedure Laterality Date   APPENDECTOMY     CARDIOVERSION N/A 04/29/2017   Procedure: CARDIOVERSION;  Surgeon: [name], MD;  Location: [place];  Service: Cardiovascular;  Laterality: N/A;   CHOLECYSTECTOMY     colonscopy     CYSTOSCOPY WITH INSERTION OF UROLIFT N/A 03/15/2017   Procedure: CYSTOSCOPY WITH INSERTION OF UROLIFT;  Surgeon: [name], MD;  Location: [place];  Service: Urology;  Laterality: N/A;   EYE SURGERY     ioc for  cataracts   LAPAROSCOPIC RIGHT COLECTOMY Right 07/11/2017   Procedure: LAPAROSCOPIC RIGHT COLECTOMY, REPAIR LAPAROSCOPIC  ABDOMINAL HERNIA;  Surgeon: Alphonsa Overall, MD;  Location: [facility];  Service: General;  Laterality: Right;    Current Facility-Administered Medications  Medication Dose Route Frequency Provider Last Rate Last Admin   0.9 %  sodium chloride infusion   Intravenous Continuous Constance Haw, MD 50 mL/hr at 12/17/20 1304 New Bag at 12/17/20 1304   ceFAZolin (ANCEF) IVPB 2g/100 mL premix  2 g Intravenous On Call Deonne Rooks, Ocie Doyne, MD       chlorhexidine (HIBICLENS) 4 % liquid 4 application  60 mL Topical Once Rashaunda Rahl Hassell Done, MD       chlorhexidine (HIBICLENS) 4 % liquid 4 application  60 mL Topical Once Ahmira Boisselle Hassell Done, MD       gentamicin (GARAMYCIN) 80 mg in sodium chloride 0.9 % 500 mL irrigation  80 mg Irrigation On Call Roselinda Bahena, Ocie Doyne, MD        Allergies  Allergen Reactions   Lipitor [Atorvastatin Calcium] Other (See Comments)    cramping    Social History   Socioeconomic History   Marital status: Married    Spouse name: Not on file   Number of children: Not on file   Years of education: Not on file   Highest education level: Not on file  Occupational History   Not on file  Tobacco Use   Smoking status:  Former    Packs/day: 0.50    Years: 15.00    Pack years: 7.50    Types: Cigarettes    Quit date: 07/21/1971    Years since quitting: 49.4   Smokeless tobacco: Never  Vaping Use   Vaping Use: Never used  Substance and Sexual Activity   Alcohol use: Yes    Comment: 2 glasses wine per day   Drug use: No   Sexual activity: Not on file  Other Topics Concern   Not on file  Social History Narrative   Not on file   Social Determinants of Health   Financial Resource Strain: Not on file  Food Insecurity: Not on file  Transportation Needs: Not on file  Physical Activity: Not on file  Stress: Not on file  Social Connections: Not  on file  Intimate Partner Violence: Not on file     Review of Systems: General: No chills, fever, night sweats or weight changes  Cardiovascular:  No chest pain, dyspnea on exertion, edema, orthopnea, palpitations, paroxysmal nocturnal dyspnea Dermatological: No rash, lesions or masses Respiratory: No cough, dyspnea Urologic: No hematuria, dysuria Abdominal: No nausea, vomiting, diarrhea, bright red blood per rectum, melena, or hematemesis Neurologic: No visual changes, weakness, changes in mental status All other systems reviewed and are otherwise negative except as noted above.  Physical Exam: Vitals:   12/17/20 1238  BP: (!) 160/104  Pulse: 62  Resp: 18  Temp: 97.9 F (36.6 C)  TempSrc: Oral  SpO2: 96%  Weight: 81.2 kg  Height: 6' (1.829 m)    GEN- The patient is well appearing, alert and oriented x 3 today.   HEENT: normocephalic, atraumatic; sclera clear, conjunctiva pink; hearing intact; oropharynx clear; neck supple, no JVP Lymph- no cervical lymphadenopathy Lungs- Clear to ausculation bilaterally, normal work of breathing.  No wheezes, rales, rhonchi Heart- Regular rate and rhythm, no murmurs, rubs or gallops, PMI not laterally displaced GI- soft, non-tender, non-distended, bowel sounds present, no hepatosplenomegaly Extremities- no clubbing, cyanosis, or edema; DP/PT/radial pulses 2+ bilaterally MS- no significant deformity or atrophy Skin- warm and dry, no rash or lesion Psych- euthymic mood, full affect Neuro- strength and sensation are intact  EKG is ordered. Personal review of EKG from today shows at least 2:1 AV block at 36 bpm and a PVC  Additional studies reviewed include: Previous EP office visits  Assessment and Plan:  1. Persistent atrial fibrillation Continue eliquis for CHA2DS2-VASc of at least 3.  He previously had QRS widening on flecainide and 2:1 AV block on diltiazem.    He is in NSR today with 2:1 AV block.  Last QRS complex on the EKG has  a different p wave morphology. This could also be CHB with junctional rhythm.  2. 2:1 AV block MONIQUE HOEG has presented today for surgery, with the diagnosis of heart block.  The various methods of treatment have been discussed with the patient and family. After consideration of risks, benefits and other options for treatment, the patient has consented to  Procedure(s): Pacemaker implant as a surgical intervention .  Risks include but not limited to bleeding, infection, pneumothorax, perforation, tamponade, vascular damage, renal failure, MI, stroke, death, and lead dislodgement . The patient's history has been reviewed, patient examined, no change in status, stable for surgery.  I have reviewed the patient's chart and labs.  Questions were answered to the patient's satisfaction.    Lalaine Overstreet Curt Bears, MD 12/17/2020 2:30 PM

## 2020-12-17 NOTE — Progress Notes (Signed)
Pt ambulated without difficulty or bleeding.   Discharged home with his wife who will drive and stay with pt x 24 hrs. 

## 2020-12-18 ENCOUNTER — Encounter (HOSPITAL_COMMUNITY): Payer: Self-pay | Admitting: Cardiology

## 2020-12-30 ENCOUNTER — Other Ambulatory Visit: Payer: Self-pay | Admitting: Cardiology

## 2020-12-30 NOTE — Telephone Encounter (Signed)
Eliquis '5mg'$  refill request received. Patient is 85 years old, weight-81.2kg, Crea-0.90 on 12/15/2020, Diagnosis-Afib, and last seen by Oda Kilts PA on 10/28/2020. Dose is appropriate based on dosing criteria. Will send in refill to requested pharmacy.

## 2020-12-31 ENCOUNTER — Ambulatory Visit (INDEPENDENT_AMBULATORY_CARE_PROVIDER_SITE_OTHER): Payer: Medicare PPO

## 2020-12-31 ENCOUNTER — Other Ambulatory Visit: Payer: Self-pay

## 2020-12-31 DIAGNOSIS — R001 Bradycardia, unspecified: Secondary | ICD-10-CM

## 2020-12-31 NOTE — Patient Instructions (Signed)
   After Your Pacemaker   Monitor your pacemaker site for redness, swelling, and drainage. Call the device clinic at [phone] if you experience these symptoms or fever/chills.  Your incision was closed with Steri-strips or staples:  You may shower 7 days after your procedure and wash your incision with soap and water. Avoid lotions, ointments, or perfumes over your incision until it is well-healed. You may apply ice pack for swelling and discomfort however make sure not to apply directly over the skin. Please call device clinic if swelling is not improved in 1 week.  You may use a hot tub or a pool after your wound check appointment if the incision is completely closed.  Do not lift, push or pull greater than 10 pounds with the affected arm until 6 weeks after your procedure. There are no other restrictions in arm movement after your wound check appointment.  You may drive, unless driving has been restricted by your healthcare providers.  Your Pacemaker is MRI compatible.  Remote monitoring is used to monitor your pacemaker from home. This monitoring is scheduled every 91 days by our office. It allows [place] to keep an eye on the functioning of your device to ensure it is working properly. You will routinely see your Electrophysiologist annually (more often if necessary).

## 2021-01-01 NOTE — Progress Notes (Signed)
Wound check appointment. Steri-strips removed. Wound without redness or edema. Small hematoma noted over device. AChalmers Cater, PA in to assess. Incision edges approximated, wound well healed. Normal device function. Thresholds, sensing, and impedances consistent with implant measurements. Device programmed at 3.5V/auto capture programmed on for extra safety margin until 3 month visit. Histogram distribution appropriate for patient and level of activity. AT/AF burden 65.6% with known history of persistent AF. Most recent episode 12/20/20 for 8 days. Average A/V 368/123, + eliquis. Patient educated about wound care, arm mobility, lifting restrictions. Patient enrolled in remote monitoring with next transmission scheduled 04/01/21. 91 day post implant follow up with Dr. Curt Bears 04/01/21. Patient is instructed to monitor hematoma and notify device clinic if no change or worse within 1 week. Otherwise follow up at 91 day visit.

## 2021-01-02 DIAGNOSIS — E538 Deficiency of other specified B group vitamins: Secondary | ICD-10-CM | POA: Diagnosis not present

## 2021-02-03 DIAGNOSIS — E538 Deficiency of other specified B group vitamins: Secondary | ICD-10-CM | POA: Diagnosis not present

## 2021-02-17 DIAGNOSIS — D510 Vitamin B12 deficiency anemia due to intrinsic factor deficiency: Secondary | ICD-10-CM | POA: Diagnosis not present

## 2021-02-17 DIAGNOSIS — E7800 Pure hypercholesterolemia, unspecified: Secondary | ICD-10-CM | POA: Diagnosis not present

## 2021-02-17 DIAGNOSIS — N401 Enlarged prostate with lower urinary tract symptoms: Secondary | ICD-10-CM | POA: Diagnosis not present

## 2021-02-17 DIAGNOSIS — K219 Gastro-esophageal reflux disease without esophagitis: Secondary | ICD-10-CM | POA: Diagnosis not present

## 2021-02-17 DIAGNOSIS — I480 Paroxysmal atrial fibrillation: Secondary | ICD-10-CM | POA: Diagnosis not present

## 2021-02-17 DIAGNOSIS — H409 Unspecified glaucoma: Secondary | ICD-10-CM | POA: Diagnosis not present

## 2021-02-17 DIAGNOSIS — I10 Essential (primary) hypertension: Secondary | ICD-10-CM | POA: Diagnosis not present

## 2021-03-04 DIAGNOSIS — H401133 Primary open-angle glaucoma, bilateral, severe stage: Secondary | ICD-10-CM | POA: Diagnosis not present

## 2021-03-24 ENCOUNTER — Other Ambulatory Visit: Payer: Self-pay

## 2021-03-24 ENCOUNTER — Ambulatory Visit: Payer: Medicare PPO | Admitting: Cardiology

## 2021-03-24 ENCOUNTER — Encounter: Payer: Self-pay | Admitting: Cardiology

## 2021-03-24 VITALS — BP 136/86 | HR 69 | Ht 72.0 in | Wt 170.0 lb

## 2021-03-24 DIAGNOSIS — I4819 Other persistent atrial fibrillation: Secondary | ICD-10-CM | POA: Diagnosis not present

## 2021-03-24 NOTE — Patient Instructions (Signed)
Medication Instructions:  Your physician recommends that you continue on your current medications as directed. Please refer to the Current Medication list given to you today.  *If you need a refill on your cardiac medications before your next appointment, please call your pharmacy*   Lab Work: None ordered   Testing/Procedures: None ordered   Follow-Up: At [facility], you and your health needs are our priority.  As part of our continuing mission to provide you with exceptional heart care, we have created designated Provider Care Teams.  These Care Teams include your primary Cardiologist (physician) and Advanced Practice Providers (APPs -  Physician Assistants and Nurse Practitioners) who all work together to provide you with the care you need, when you need it.  We recommend signing up for the patient portal called "MyChart".  Sign up information is provided on this After Visit Summary.  MyChart is used to connect with patients for Virtual Visits (Telemedicine).  Patients are able to view lab/test results, encounter notes, upcoming appointments, etc.  Non-urgent messages can be sent to your provider as well.   To learn more about what you can do with MyChart, go to [email or web address].    Remote monitoring is used to monitor your Pacemaker or ICD from home. This monitoring reduces the number of office visits required to check your device to one time per year. It allows [place] to keep an eye on the functioning of your device to ensure it is working properly. You are scheduled for a device check from home on 04/01/2021. You may send your transmission at any time that day. If you have a wireless device, the transmission will be sent automatically. After your physician reviews your transmission, you will receive a postcard with your next transmission date.  Your next appointment:   6 month(s)  The format for your next appointment:   In Person  Provider:   You will see one of the  following Advanced Practice Providers on your designated Care Team:   Tommye Standard, [place] Legrand Como "Jonni Sanger" Chalmers Cater, [place]   Thank you for choosing [facility]!!   Trinidad Curet, RN [phone]

## 2021-03-24 NOTE — Progress Notes (Signed)
Electrophysiology Office Note   Date:  03/24/2021   ID:  Brian Horn, DOB [birth year], MRN [id number]  PCP:  [name], MD  Cardiologist:   Primary Electrophysiologist:  Raileigh Sabater Meredith [name], MD    Chief Complaint  Patient presents with   Follow-up      History of Present Illness: Brian Horn is a 85 y.o. male who is being seen today for the evaluation of atrial fibrillation at the request of [name], MD. Presenting today for electrophysiology evaluation.    He has a history significant for persistent atrial fibrillation, hypertension, hyperlipidemia.  He was initially on flecainide but had significant QRS widening and a left bundle branch block pattern and thus flecainide was stopped.  He has a history of colon cancer and is status post laparoscopic cholecystectomy 07/11/2017.  He presented to clinic in 2-1 AV block and is now status post Medtronic dual-chamber pacemaker implanted 12/17/2020.  Today, denies symptoms of palpitations, chest pain, shortness of breath, orthopnea, PND, lower extremity edema, claudication, dizziness, presyncope, syncope, bleeding, or neurologic sequela. The patient is tolerating medications without difficulties.  Since his pacemaker was implanted he is felt much better.  He has no further episodes of weakness and fatigue.  He is able to do all of his daily activities without restriction.  He is overall happy with his control.  Past Medical History:  Diagnosis Date   Blood dyscrasia    hemachromatosis, takes tx q year   ED (erectile dysfunction)    GERD (gastroesophageal reflux disease)    Glaucoma    right eye   Hematuria, gross    History of kidney stones    Hyperlipidemia    Hypertension    Inguinal hernia    Paroxysmal atrial fibrillation (HCC)    Peyronie's disease    Pulmonic stenosis    mild   Tinnitus    Past Surgical History:  Procedure Laterality Date   APPENDECTOMY     CARDIOVERSION N/A 04/29/2017   Procedure:  CARDIOVERSION;  Surgeon: [name], MD;  Location: [place];  Service: Cardiovascular;  Laterality: N/A;   CHOLECYSTECTOMY     colonscopy     CYSTOSCOPY WITH INSERTION OF UROLIFT N/A 03/15/2017   Procedure: CYSTOSCOPY WITH INSERTION OF UROLIFT;  Surgeon: [name], MD;  Location: [place];  Service: Urology;  Laterality: N/A;   EYE SURGERY     ioc for cataracts   LAPAROSCOPIC RIGHT COLECTOMY Right 07/11/2017   Procedure: LAPAROSCOPIC RIGHT COLECTOMY, REPAIR LAPAROSCOPIC  ABDOMINAL HERNIA;  Surgeon: Alphonsa Overall, MD;  Location: [facility];  Service: General;  Laterality: Right;   PACEMAKER IMPLANT N/A 12/17/2020   Procedure: PACEMAKER IMPLANT;  Surgeon: Constance Haw, MD;  Location: [place] [facility];  Service: Cardiovascular;  Laterality: N/A;     Current Outpatient Medications  Medication Sig Dispense Refill   acetaminophen (TYLENOL) 500 MG tablet Take 500-1,000 mg by mouth every 6 (six) hours as needed (for pain.).     benazepril (LOTENSIN) 20 MG tablet TAKE 1 TABLET BY MOUTH EVERYDAY AT BEDTIME 90 tablet 3   brimonidine (ALPHAGAN) 0.2 % ophthalmic solution Place 1 drop into both eyes 2 (two) times daily.     Cholecalciferol (VITAMIN D3) 1000 units CAPS Take 1,000 Units by mouth at bedtime.      Cinnamon 500 MG TABS Take 500 mg by mouth at bedtime.      Coenzyme Q10 (CO Q-10 PO) Take 10 mLs by mouth daily. 100 mg /  liquid     Cyanocobalamin (B-12 COMPLIANCE INJECTION) 1000 MCG/ML KIT Inject 1 mL as directed every 30 (thirty) days.     ELIQUIS 5 MG TABS tablet TAKE 1 TABLET BY MOUTH TWICE A DAY 180 tablet 1   finasteride (PROSCAR) 5 MG tablet Take 5 mg by mouth daily.     lansoprazole (PREVACID) 15 MG capsule Take 15 mg by mouth daily before breakfast.     latanoprost (XALATAN) 0.005 % ophthalmic solution Place 1 drop into both eyes daily.     Multiple Vitamin (MULTIVITAMIN WITH MINERALS) TABS tablet Take 1 tablet by mouth every other day.      Probiotic Product (ALIGN) 4 MG CAPS daily.     sildenafil (VIAGRA) 100 MG tablet Take 100 mg by mouth as needed for erectile dysfunction.     simvastatin (ZOCOR) 10 MG tablet Take 10 mg by mouth at bedtime.      VYZULTA 0.024 % SOLN Place 1 drop into both eyes daily.     No current facility-administered medications for this visit.    Allergies:   Lipitor [atorvastatin calcium]   Social History:  The patient  reports that he quit smoking about 49 years ago. His smoking use included cigarettes. He has a 7.50 pack-year smoking history. He has never used smokeless tobacco. He reports current alcohol use. He reports that he does not use drugs.   Family History:  The patient's family history includes Heart disease in his father and mother.   ROS:  Please see the history of present illness.   Otherwise, review of systems is positive for none.   All other systems are reviewed and negative.   PHYSICAL EXAM: VS:  BP 136/86   Pulse 69   Ht 6' (1.829 m)   Wt 170 lb (77.1 kg)   SpO2 97%   BMI 23.06 kg/m  , BMI Body mass index is 23.06 kg/m. GEN: Well nourished, well developed, in no acute distress  HEENT: normal  Neck: no JVD, carotid bruits, or masses Cardiac: RRR; no murmurs, rubs, or gallops,no edema  Respiratory:  clear to auscultation bilaterally, normal work of breathing GI: soft, nontender, nondistended, + BS MS: no deformity or atrophy  Skin: warm and dry, device site well healed Neuro:  Strength and sensation are intact Psych: euthymic mood, full affect  EKG:  EKG is ordered today. Personal review of the ekg ordered shows sinus rhythm, ventricular paced  Personal review of the device interrogation today. Results in [place]: 10/28/2020: TSH 2.220 12/15/2020: BUN 15; Creatinine, Ser 0.90; Hemoglobin 13.9; Platelets 218; Potassium 4.2; Sodium 138    Lipid Panel  No results found for: CHOL, TRIG, HDL, CHOLHDL, VLDL, LDLCALC, LDLDIRECT   Wt Readings from Last 3  Encounters:  03/24/21 170 lb (77.1 kg)  12/17/20 179 lb (81.2 kg)  10/28/20 172 lb (78 kg)      Other studies Reviewed: Additional studies/ records that were reviewed today include: TTE 08/27/15  Review of the above records today demonstrates:  - Left ventricle: The cavity size was normal. Wall thickness was   increased in a pattern of mild LVH. Systolic function was   vigorous. The estimated ejection fraction was in the range of 65%   to 70%. Wall motion was normal; there were no regional wall   motion abnormalities. Doppler parameters are consistent with   abnormal left ventricular relaxation (grade 1 diastolic   dysfunction). The E/e&' ratio is <8, suggesting normal LV filling  pressure. - Aortic valve: Sclerosis without stenosis. Transvalvular velocity   was minimally increased. Mean gradient (S): 8 mm Hg. Peak   gradient (S): 17 mm Hg. - Left atrium: The atrium was normal in size. - Atrial septum: No defect or patent foramen ovale was identified. - Tricuspid valve: There was trivial regurgitation. - Pulmonic valve: Poorly visualized. Mild pulmonic stenosis. Peak   gradient of 32 mmHg. - Pulmonary arteries: Poorly visualized. PA peak pressure: 11 mm Hg   (S). - Inferior vena cava: The vessel was normal in size. The   respirophasic diameter changes were in the normal range (= 50%),   consistent with normal central venous pressure.   ASSESSMENT AND PLAN:  1.  Persistent atrial fibrillation: Currently on Eliquis and diltiazem.  Was previously on flecainide but had QRS widening.  CHA2DS2-VASc of 3.  He remains in sinus rhythm.  2.  2-1 AV block: Status post Medtronic dual-chamber pacemaker implanted 12/17/2020.  Device functioning appropriately.  No changes at this time.  3.  Hypertension: Currently well controlled  4.  Hyperlipidemia: Continue simvastatin per primary cardiology.  Current medicines are reviewed at length with the patient today.   The patient does not have  concerns regarding his medicines.  The following changes were made today: None  Labs/ tests ordered today include:  Orders Placed This Encounter  Procedures   EKG 12-Lead      Disposition:   FU with Emileigh Kellett 6 months  Signed, Tarrie Mcmichen Meredith [name], MD  03/24/2021 11:24 AM     [place] [address] [phone] (office) [phone] (fax)

## 2021-04-01 ENCOUNTER — Ambulatory Visit (INDEPENDENT_AMBULATORY_CARE_PROVIDER_SITE_OTHER): Payer: Medicare PPO

## 2021-04-01 DIAGNOSIS — I442 Atrioventricular block, complete: Secondary | ICD-10-CM

## 2021-04-01 LAB — CUP PACEART REMOTE DEVICE CHECK
Battery Remaining Longevity: 177 mo
Battery Voltage: 3.21 V
Brady Statistic AP VP Percent: 0.01 %
Brady Statistic AP VS Percent: 28.06 %
Brady Statistic AS VP Percent: 0.04 %
Brady Statistic AS VS Percent: 71.9 %
Brady Statistic RA Percent Paced: 28.17 %
Brady Statistic RV Percent Paced: 0.04 %
Date Time Interrogation Session: 20221206214141
Implantable Lead Implant Date: 20220824
Implantable Lead Implant Date: 20220824
Implantable Lead Location: 753859
Implantable Lead Location: 753860
Implantable Lead Model: 5076
Implantable Lead Model: 5076
Implantable Pulse Generator Implant Date: 20220824
Lead Channel Impedance Value: 285 Ohm
Lead Channel Impedance Value: 361 Ohm
Lead Channel Impedance Value: 456 Ohm
Lead Channel Impedance Value: 475 Ohm
Lead Channel Pacing Threshold Amplitude: 0.625 V
Lead Channel Pacing Threshold Amplitude: 0.625 V
Lead Channel Pacing Threshold Pulse Width: 0.4 ms
Lead Channel Pacing Threshold Pulse Width: 0.4 ms
Lead Channel Sensing Intrinsic Amplitude: 2.875 mV
Lead Channel Sensing Intrinsic Amplitude: 2.875 mV
Lead Channel Sensing Intrinsic Amplitude: 8.625 mV
Lead Channel Sensing Intrinsic Amplitude: 8.625 mV
Lead Channel Setting Pacing Amplitude: 1.5 V
Lead Channel Setting Pacing Amplitude: 2 V
Lead Channel Setting Pacing Pulse Width: 0.4 ms
Lead Channel Setting Sensing Sensitivity: 0.9 mV

## 2021-04-09 NOTE — Progress Notes (Signed)
Remote pacemaker transmission.   

## 2021-04-30 DIAGNOSIS — E538 Deficiency of other specified B group vitamins: Secondary | ICD-10-CM | POA: Diagnosis not present

## 2021-06-02 DIAGNOSIS — E538 Deficiency of other specified B group vitamins: Secondary | ICD-10-CM | POA: Diagnosis not present

## 2021-06-10 DIAGNOSIS — R251 Tremor, unspecified: Secondary | ICD-10-CM | POA: Diagnosis not present

## 2021-06-10 DIAGNOSIS — I10 Essential (primary) hypertension: Secondary | ICD-10-CM | POA: Diagnosis not present

## 2021-06-10 DIAGNOSIS — Z85038 Personal history of other malignant neoplasm of large intestine: Secondary | ICD-10-CM | POA: Diagnosis not present

## 2021-06-10 DIAGNOSIS — I480 Paroxysmal atrial fibrillation: Secondary | ICD-10-CM | POA: Diagnosis not present

## 2021-06-10 DIAGNOSIS — N401 Enlarged prostate with lower urinary tract symptoms: Secondary | ICD-10-CM | POA: Diagnosis not present

## 2021-07-01 ENCOUNTER — Ambulatory Visit (INDEPENDENT_AMBULATORY_CARE_PROVIDER_SITE_OTHER): Payer: Medicare PPO

## 2021-07-01 DIAGNOSIS — I442 Atrioventricular block, complete: Secondary | ICD-10-CM

## 2021-07-02 LAB — CUP PACEART REMOTE DEVICE CHECK
Battery Remaining Longevity: 175 mo
Battery Voltage: 3.19 V
Brady Statistic AP VP Percent: 0.01 %
Brady Statistic AP VS Percent: 29.13 %
Brady Statistic AS VP Percent: 0.03 %
Brady Statistic AS VS Percent: 70.84 %
Brady Statistic RA Percent Paced: 29.17 %
Brady Statistic RV Percent Paced: 0.04 %
Date Time Interrogation Session: 20230308220506
Implantable Lead Implant Date: 20220824
Implantable Lead Implant Date: 20220824
Implantable Lead Location: 753859
Implantable Lead Location: 753860
Implantable Lead Model: 5076
Implantable Lead Model: 5076
Implantable Pulse Generator Implant Date: 20220824
Lead Channel Impedance Value: 304 Ohm
Lead Channel Impedance Value: 342 Ohm
Lead Channel Impedance Value: 456 Ohm
Lead Channel Impedance Value: 570 Ohm
Lead Channel Pacing Threshold Amplitude: 0.75 V
Lead Channel Pacing Threshold Amplitude: 0.75 V
Lead Channel Pacing Threshold Pulse Width: 0.4 ms
Lead Channel Pacing Threshold Pulse Width: 0.4 ms
Lead Channel Sensing Intrinsic Amplitude: 2.875 mV
Lead Channel Sensing Intrinsic Amplitude: 2.875 mV
Lead Channel Sensing Intrinsic Amplitude: 8.875 mV
Lead Channel Sensing Intrinsic Amplitude: 8.875 mV
Lead Channel Setting Pacing Amplitude: 1.5 V
Lead Channel Setting Pacing Amplitude: 2 V
Lead Channel Setting Pacing Pulse Width: 0.4 ms
Lead Channel Setting Sensing Sensitivity: 0.9 mV

## 2021-07-03 DIAGNOSIS — E538 Deficiency of other specified B group vitamins: Secondary | ICD-10-CM | POA: Diagnosis not present

## 2021-07-14 NOTE — Progress Notes (Signed)
Remote pacemaker transmission.   

## 2021-07-16 DIAGNOSIS — R011 Cardiac murmur, unspecified: Secondary | ICD-10-CM | POA: Diagnosis not present

## 2021-07-16 DIAGNOSIS — I10 Essential (primary) hypertension: Secondary | ICD-10-CM | POA: Diagnosis not present

## 2021-08-04 DIAGNOSIS — E538 Deficiency of other specified B group vitamins: Secondary | ICD-10-CM | POA: Diagnosis not present

## 2021-08-05 DIAGNOSIS — H401133 Primary open-angle glaucoma, bilateral, severe stage: Secondary | ICD-10-CM | POA: Diagnosis not present

## 2021-08-09 ENCOUNTER — Other Ambulatory Visit: Payer: Self-pay | Admitting: Cardiology

## 2021-08-10 NOTE — Telephone Encounter (Signed)
Prescription refill request for Eliquis received. ?Indication: Atrial Fib ?Last office visit: 03/24/21  Elliot Cousin MD ?Scr: 0.90 on 12/15/20 ?Age: 86 ?Weight: 77.1kg ? ?Based on above findings Eliquis '5mg'$  twice daily is the appropriate dose.  Refill approved. ? ?

## 2021-08-13 ENCOUNTER — Other Ambulatory Visit: Payer: Self-pay | Admitting: Cardiology

## 2021-08-24 DIAGNOSIS — D692 Other nonthrombocytopenic purpura: Secondary | ICD-10-CM | POA: Diagnosis not present

## 2021-08-24 DIAGNOSIS — L820 Inflamed seborrheic keratosis: Secondary | ICD-10-CM | POA: Diagnosis not present

## 2021-08-24 DIAGNOSIS — L814 Other melanin hyperpigmentation: Secondary | ICD-10-CM | POA: Diagnosis not present

## 2021-08-24 DIAGNOSIS — Z85828 Personal history of other malignant neoplasm of skin: Secondary | ICD-10-CM | POA: Diagnosis not present

## 2021-08-24 DIAGNOSIS — L570 Actinic keratosis: Secondary | ICD-10-CM | POA: Diagnosis not present

## 2021-08-24 DIAGNOSIS — D225 Melanocytic nevi of trunk: Secondary | ICD-10-CM | POA: Diagnosis not present

## 2021-08-24 DIAGNOSIS — L821 Other seborrheic keratosis: Secondary | ICD-10-CM | POA: Diagnosis not present

## 2021-08-24 DIAGNOSIS — D1801 Hemangioma of skin and subcutaneous tissue: Secondary | ICD-10-CM | POA: Diagnosis not present

## 2021-08-26 ENCOUNTER — Other Ambulatory Visit: Payer: Self-pay | Admitting: Gastroenterology

## 2021-08-26 DIAGNOSIS — Z85038 Personal history of other malignant neoplasm of large intestine: Secondary | ICD-10-CM | POA: Diagnosis not present

## 2021-08-26 DIAGNOSIS — Z7901 Long term (current) use of anticoagulants: Secondary | ICD-10-CM | POA: Diagnosis not present

## 2021-09-08 DIAGNOSIS — E538 Deficiency of other specified B group vitamins: Secondary | ICD-10-CM | POA: Diagnosis not present

## 2021-09-30 ENCOUNTER — Ambulatory Visit (INDEPENDENT_AMBULATORY_CARE_PROVIDER_SITE_OTHER): Payer: Medicare PPO

## 2021-09-30 DIAGNOSIS — I442 Atrioventricular block, complete: Secondary | ICD-10-CM

## 2021-10-01 LAB — CUP PACEART REMOTE DEVICE CHECK
Battery Remaining Longevity: 171 mo
Battery Voltage: 3.15 V
Brady Statistic AP VP Percent: 0.01 %
Brady Statistic AP VS Percent: 24.76 %
Brady Statistic AS VP Percent: 0.03 %
Brady Statistic AS VS Percent: 75.21 %
Brady Statistic RA Percent Paced: 24.99 %
Brady Statistic RV Percent Paced: 0.03 %
Date Time Interrogation Session: 20230607211629
Implantable Lead Implant Date: 20220824
Implantable Lead Implant Date: 20220824
Implantable Lead Location: 753859
Implantable Lead Location: 753860
Implantable Lead Model: 5076
Implantable Lead Model: 5076
Implantable Pulse Generator Implant Date: 20220824
Lead Channel Impedance Value: 304 Ohm
Lead Channel Impedance Value: 323 Ohm
Lead Channel Impedance Value: 456 Ohm
Lead Channel Impedance Value: 513 Ohm
Lead Channel Pacing Threshold Amplitude: 0.75 V
Lead Channel Pacing Threshold Amplitude: 0.75 V
Lead Channel Pacing Threshold Pulse Width: 0.4 ms
Lead Channel Pacing Threshold Pulse Width: 0.4 ms
Lead Channel Sensing Intrinsic Amplitude: 3 mV
Lead Channel Sensing Intrinsic Amplitude: 3 mV
Lead Channel Sensing Intrinsic Amplitude: 7.875 mV
Lead Channel Sensing Intrinsic Amplitude: 7.875 mV
Lead Channel Setting Pacing Amplitude: 1.5 V
Lead Channel Setting Pacing Amplitude: 2 V
Lead Channel Setting Pacing Pulse Width: 0.4 ms
Lead Channel Setting Sensing Sensitivity: 0.9 mV

## 2021-10-09 DIAGNOSIS — E538 Deficiency of other specified B group vitamins: Secondary | ICD-10-CM | POA: Diagnosis not present

## 2021-10-09 NOTE — Progress Notes (Signed)
Remote pacemaker transmission.   

## 2021-11-09 DIAGNOSIS — E538 Deficiency of other specified B group vitamins: Secondary | ICD-10-CM | POA: Diagnosis not present

## 2021-11-12 DIAGNOSIS — K219 Gastro-esophageal reflux disease without esophagitis: Secondary | ICD-10-CM | POA: Diagnosis not present

## 2021-11-12 DIAGNOSIS — E7800 Pure hypercholesterolemia, unspecified: Secondary | ICD-10-CM | POA: Diagnosis not present

## 2021-11-12 DIAGNOSIS — I480 Paroxysmal atrial fibrillation: Secondary | ICD-10-CM | POA: Diagnosis not present

## 2021-11-12 DIAGNOSIS — I10 Essential (primary) hypertension: Secondary | ICD-10-CM | POA: Diagnosis not present

## 2021-11-12 DIAGNOSIS — N401 Enlarged prostate with lower urinary tract symptoms: Secondary | ICD-10-CM | POA: Diagnosis not present

## 2021-12-01 ENCOUNTER — Encounter (HOSPITAL_COMMUNITY): Payer: Self-pay | Admitting: Gastroenterology

## 2021-12-07 ENCOUNTER — Encounter (HOSPITAL_COMMUNITY): Payer: Self-pay | Admitting: Gastroenterology

## 2021-12-07 NOTE — H&P (Signed)
History of Present Illness  General:  Patient is an 86 year old male who presents to set up repeat colonoscopy. Patient has previously seen Dr. Penelope Coop, history of colon cancer (cecal adenocarcinoma dx 2019, resected and no chemotherapy required). Colonoscopy 09/12/2018 - Tubular adenoma, internal hemorrhoids, 2 year recall EGD 2019 - Normal Labs 05/2021 - Normal CBC and ferritin Patient denies concerns today. Denies nausea, vomiting, fever, chills, abdominal pain, dysphagia, reflux, heartburn. Bowel movements are regular, daily or every other day, has occasional constipation. Denies diarrhea, melena, hematochezia, weight loss.  Patient denies family history of GI malignancy or disease. Denies MI/stroke history, is on Eliquis, is not diabetic.   Current Medications  Taking   amLODIPine Besylate 5 MG Tablet TAKE 1 TABLET BY MOUTH EVERY DAY FOR 30 DAYS   Benazepril HCl 20 MG Tablet 1 tablet Orally Once a day in evening  Eliquis(Apixaban) 5 MG Tablet 1 tablet Orally twice a day  Finasteride 5 MG Tablet 1 tablet Orally once a day in evening  Simvastatin 20 MG Tablet TAKE 1/2 TABLET BY MOUTH ONCE DAILY IN THE EVENING   Prevacid(Lansoprazole) 15 MG Capsule Delayed Release 1 capsule before a meal Orally Once a day, in am  Cyanocobalamin 1000 MCG/ML Liquid inject im once a month  Sildenafil Citrate 100 MG Tablet 1 tablet as needed Orally once a day if needed  CoQ-10 150 MG Capsule 1 capsule with a meal Orally Once a day, in pm  Brimonidine Tartrate 0.2 % Solution 1 drop into affected eye Ophthalmic twice a day  Align 4 MG Capsule 1 capsule Orally once a day  Cinnamon 500 MG Capsule 1 capsule Orally once a day  Tylenol(APAP) 325 MG Tablet 2 tablets as needed Orally prn  Vitamin D3 1000 UNIT Capsule 1 capsule Orally Once a day  Latanoprost 0.005 % Solution 1 drop into affected eye in the evening Ophthalmic Once a day  Medication List reviewed and reconciled with the patient   Past Medical History   Hypertension.   Hereditary hemochromatosis, verified by genetic studies.   Erectile dysfunction.   Hyperlipidemia.   Mild pulmonic stenosis.   History of paroxysmal atrial fibrillation.   Glaucoma.   Nephrolithiasis.   Tinnitus.   Gross hematuria, workup -2007.   Left renal cyst.   Peyronie's disease.   GERD.   Melanoma,l neck 2015.   BPH, urinary retention/prostatitis, 2018.   right-sided colon cancer, 2.5 centimeters, moderately differentiated, T1, N0, M0, zero of 17 nodes surgery 3/19.   Chadsvasc = 3.   A-fib-electrial cardioversion, 1/19 Dr. Chaney Born.   Pernicious anemia size B-12 deficiency, 2021.   Complete heart block pacemaker, 8/22, [facility].   Ophtho mccuen, derm [facility].    Surgical History  appendectomy   cholecystectomy   cataract OD 8/12  [facility] 2013  L neck melanoma, [facility] 2015  open right inguinal hernia repair with mesh, [facility] December 2015  cystoscopy with urolift [facility] November 2018  Laparoscopic right colectomy, repair laparoscopic ABD hernia - Dr. Lucia Gaskins 06/2017  colonoscopy 08/2018  Pacemaker Aug 2022   Family History  Father: deceased 66 yrs, Heart disease, possible hemochromatosis, diagnosed with Coronary artery disease  Mother: deceased 69 yrs, Pacemaker  Brother 1: alive, Hemochromatosis, hypertension, diagnosed with Hypertension  Brother2: deceased, AAA, died colon cancer  Sister 1: deceased, Hemochromatosis, nephrolithiasis, liver cancer  Sister 2: alive, Kidney stone  Sister 3: alive  2 brother(s) , 3 sister(s) . 1 son(s) , 2 daughter(s) .   No Family History  of Colon Cancer, Polyps, or Liver Disease.   Social History  General:  Tobacco use  cigarettes: Former smoker Quit in year 93's Pack-year Hx: 5 Tobacco history last updated 08/26/2021 Additional Findings: Tobacco Non-User Ex-moderate cigarette smoker (10-19/day) EXPOSURE TO PASSIVE SMOKE: quit early 40s.  Alcohol: yes,  3-4 glasses wine per week.  Caffeine: yes, coffee, 1 serving daily.  no Recreational drug use.  Exercise: regular.  Marital Status: Divorced twice, remarried.  OCCUPATION: owns video game company, Geographical information systems officer.    Allergies  Lipitor: cramping - Side Effects   Hospitalization/Major Diagnostic Procedure  above surgery 06/2017  None in the past year 08/2021   Review of Systems  GI PROCEDURE:  Pacemaker/ AICD YES. Artificial heart valves no. MI/heart attack no. Abnormal heart rhythm YES , Atrial Fibrillation. Angina no. CVA no. Hypertension YES. Hypotension no. Asthma, COPD no. Sleep apnea no. Seizure disorders no. Artificial joints no. Diabetes no. Significant headaches no. Vertigo no. Depression/anxiety no. Abnormal bleeding no , Taking blood thinners, ELIQUIS. Kidney Disease no. Liver disease no. Blood transfusion no.     Vital Signs  Wt 176.8, Wt change 1.6 lbs, Ht 68, BMI 26.88, Temp 97.2, Pulse sitting 67, BP sitting 118/75.   Examination  Gastroenterology Exam: GENERAL APPEARANCE: Well developed, well nourished, no active distress.  RESPIRATORY Breath sounds normal. Respiration even and unlabored.  CARDIOVASCULAR RRR w/o murmurs or gallops. No peripheral edema.  ABDOMEN Soft, nontender. No masses palpated. Liver and spleen not palpated, normal. Bowel sounds normal, Abdomen not distended.  EXTREMITIES: No edema, pulses intact.  SKIN Warm and dry, good turgor without rashes.  PSYCHIATRIC Alert and oriented x3, mood and affect appear normal..  NEURO: alert, normal strength and tone.     Assessments     1. History of colon cancer - Z85.038 (Primary), He was a delay colonoscopy to early next year, I think this is reasonable   2. Chronic anticoagulation - Z79.01   Treatment  1. History of colon cancer  IMAGING: Colonoscopy    Collier Bullock 08/26/2021 10:35:26 AM > Patient scheduled on 12/08/21 w/ Dr. Therisa Doyne at [facility]. Prep instructions, consent and rx trilyte given to  patients. Appt scheduled and Referral faxed per Amy. Eliquis hold.....   Clinical Notes: Patient is due for repeat screening colonoscopy. I thoroughly discussed the procedure with the patient including but not limited to nature, alternatives, benefits, and risks (including but not limited to bleeding, infection, perforation, anesthesia/cardiopulmonary complications). All questions were answered and the patient acknowledges these risks and wishes to proceed with colonoscopy. Recommend this be done at the hospital due to patient's age and medical history. .    2. Chronic anticoagulation  Clinical Notes: Request to hold Eliquis prior to procedure.

## 2021-12-08 ENCOUNTER — Encounter (HOSPITAL_COMMUNITY)
Admission: RE | Disposition: A | Discharge status: Home / Self Care | Payer: Self-pay | Source: Ambulatory Visit | Attending: Gastroenterology

## 2021-12-08 ENCOUNTER — Ambulatory Visit (HOSPITAL_BASED_OUTPATIENT_CLINIC_OR_DEPARTMENT_OTHER): Payer: Medicare PPO | Admitting: Anesthesiology

## 2021-12-08 ENCOUNTER — Other Ambulatory Visit: Payer: Self-pay

## 2021-12-08 ENCOUNTER — Encounter (HOSPITAL_COMMUNITY): Payer: Self-pay | Admitting: Gastroenterology

## 2021-12-08 ENCOUNTER — Ambulatory Visit (HOSPITAL_COMMUNITY)
Admission: RE | Admit: 2021-12-08 | Discharge: 2021-12-08 | Disposition: A | Discharge status: Home / Self Care | Payer: Medicare PPO | Source: Ambulatory Visit | Attending: Gastroenterology | Admitting: Gastroenterology

## 2021-12-08 ENCOUNTER — Ambulatory Visit (HOSPITAL_COMMUNITY): Payer: Medicare PPO | Admitting: Anesthesiology

## 2021-12-08 DIAGNOSIS — Z85038 Personal history of other malignant neoplasm of large intestine: Secondary | ICD-10-CM

## 2021-12-08 DIAGNOSIS — Z7901 Long term (current) use of anticoagulants: Secondary | ICD-10-CM | POA: Diagnosis not present

## 2021-12-08 DIAGNOSIS — K648 Other hemorrhoids: Secondary | ICD-10-CM | POA: Insufficient documentation

## 2021-12-08 DIAGNOSIS — I4891 Unspecified atrial fibrillation: Secondary | ICD-10-CM | POA: Insufficient documentation

## 2021-12-08 DIAGNOSIS — D126 Benign neoplasm of colon, unspecified: Secondary | ICD-10-CM | POA: Diagnosis not present

## 2021-12-08 DIAGNOSIS — K219 Gastro-esophageal reflux disease without esophagitis: Secondary | ICD-10-CM | POA: Diagnosis not present

## 2021-12-08 DIAGNOSIS — I10 Essential (primary) hypertension: Secondary | ICD-10-CM | POA: Insufficient documentation

## 2021-12-08 DIAGNOSIS — Z87891 Personal history of nicotine dependence: Secondary | ICD-10-CM | POA: Insufficient documentation

## 2021-12-08 DIAGNOSIS — D122 Benign neoplasm of ascending colon: Secondary | ICD-10-CM | POA: Diagnosis not present

## 2021-12-08 DIAGNOSIS — Z08 Encounter for follow-up examination after completed treatment for malignant neoplasm: Secondary | ICD-10-CM | POA: Diagnosis not present

## 2021-12-08 DIAGNOSIS — K635 Polyp of colon: Secondary | ICD-10-CM | POA: Diagnosis not present

## 2021-12-08 DIAGNOSIS — Z1211 Encounter for screening for malignant neoplasm of colon: Secondary | ICD-10-CM | POA: Diagnosis not present

## 2021-12-08 DIAGNOSIS — Z980 Intestinal bypass and anastomosis status: Secondary | ICD-10-CM | POA: Insufficient documentation

## 2021-12-08 DIAGNOSIS — D490 Neoplasm of unspecified behavior of digestive system: Secondary | ICD-10-CM | POA: Insufficient documentation

## 2021-12-08 DIAGNOSIS — K5730 Diverticulosis of large intestine without perforation or abscess without bleeding: Secondary | ICD-10-CM | POA: Diagnosis not present

## 2021-12-08 DIAGNOSIS — K5140 Inflammatory polyps of colon without complications: Secondary | ICD-10-CM | POA: Diagnosis not present

## 2021-12-08 HISTORY — PX: COLONOSCOPY WITH PROPOFOL: SHX5780

## 2021-12-08 HISTORY — PX: POLYPECTOMY: SHX5525

## 2021-12-08 SURGERY — COLONOSCOPY WITH PROPOFOL
Anesthesia: Monitor Anesthesia Care

## 2021-12-08 MED ORDER — LACTATED RINGERS IV SOLN
INTRAVENOUS | Status: DC
  Administered 2021-12-08: 1000 mL via INTRAVENOUS

## 2021-12-08 MED ORDER — SODIUM CHLORIDE 0.9 % IV SOLN: INTRAVENOUS | Status: DC

## 2021-12-08 MED ORDER — LIDOCAINE HCL (CARDIAC) PF 100 MG/5ML IV SOSY
PREFILLED_SYRINGE | INTRAVENOUS | Status: DC | PRN
  Administered 2021-12-08: 60 mg via INTRAVENOUS

## 2021-12-08 MED ORDER — PROPOFOL 500 MG/50ML IV EMUL
INTRAVENOUS | Status: DC | PRN
  Administered 2021-12-08: 135 ug/kg/min via INTRAVENOUS
  Administered 2021-12-08: 40 mg via INTRAVENOUS

## 2021-12-08 MED ORDER — PROPOFOL 1000 MG/100ML IV EMUL
INTRAVENOUS | Status: AC
  Filled 2021-12-08: qty 100

## 2021-12-08 SURGICAL SUPPLY — 22 items

## 2021-12-08 NOTE — Anesthesia Preprocedure Evaluation (Addendum)
Anesthesia Evaluation  Patient identified by MRN, date of birth, ID band Patient awake    Reviewed: Allergy & Precautions, NPO status , Patient's Chart, lab work & pertinent test results  Airway Mallampati: II  TM Distance: >3 FB Neck ROM: Full    Dental  (+) Teeth Intact, Dental Advisory Given   Pulmonary former smoker,    breath sounds clear to auscultation- rhonchi       Cardiovascular hypertension, Pt. on medications + dysrhythmias Atrial Fibrillation + pacemaker + Valvular Problems/Murmurs  Rhythm:Regular Rate:Normal  '19 Exercise Stress - Blood pressure demonstrated a normal response to exercise. ETT on flecainide. Baseline ECG LBBB. No QRS widening or arrhythmia. Non diagnostic for ischemia. Did have 2 mm ST depression in inferior lateral leads, but BBB and max HR 86% make non diagnostic for ischemia  '17 TTE - Mild LVH. EF 65% to 70%. Grade 1 diastolic   dysfunction. AV sclerosis without stenosis. Trivial TR.  Mild pulmonic stenosis. Peak  gradient of 32 mmHg.  3/23 Normal remote reviewed. Battery and lead parameters stable.    Neuro/Psych Tinnitus negative psych ROS   GI/Hepatic Neg liver ROS, GERD  Medicated and Controlled,  Endo/Other  negative endocrine ROS  Renal/GU Nephrolithaisis  negative genitourinary   Musculoskeletal negative musculoskeletal ROS (+)   Abdominal   Peds  Hematology  (+) Blood dyscrasia, anemia , Hematochromatosis   Anesthesia Other Findings   Reproductive/Obstetrics                           Anesthesia Physical  Anesthesia Plan  ASA: 3  Anesthesia Plan: MAC   Post-op Pain Management:    Induction: Intravenous  PONV Risk Score and Plan: 4 or greater and Treatment may vary due to age or medical condition and Propofol infusion  Airway Management Planned: Natural Airway and Nasal Cannula  Additional Equipment: None  Intra-op Plan:    Post-operative Plan: Extubation in OR  Informed Consent: I have reviewed the patients History and Physical, chart, labs and discussed the procedure including the risks, benefits and alternatives for the proposed anesthesia with the patient or authorized representative who has indicated his/her understanding and acceptance.     Dental advisory given  Plan Discussed with: Anesthesiologist and CRNA  Anesthesia Plan Comments:         Anesthesia Quick Evaluation

## 2021-12-08 NOTE — Transfer of Care (Signed)
Immediate Anesthesia Transfer of Care Note  Patient: Brian Horn  Procedure(s) Performed: Procedure(s): COLONOSCOPY WITH PROPOFOL (N/A) POLYPECTOMY  Patient Location: PACU  Anesthesia Type:MAC  Level of Consciousness:  sedated, patient cooperative and responds to stimulation  Airway & Oxygen Therapy:Patient Spontanous Breathing and Patient connected to face mask oxgen  Post-op Assessment:  Report given to PACU RN and Post -op Vital signs reviewed and stable  Post vital signs:  Reviewed and stable  Last Vitals:  Vitals:   12/08/21 0942  BP: (!) 165/77  Pulse: 67  Resp: 20  SpO2: 223%    Complications: No apparent anesthesia complications

## 2021-12-08 NOTE — Op Note (Signed)
[facility] Patient Name: Brian Horn Procedure Date: 12/08/2021 MRN: [id number] Attending MD: Ronnette Juniper , MD Date of Birth: [birth year] CSN: [id number] Age: 86 Admit Type: Outpatient Procedure:                Colonoscopy Indications:              High risk colon cancer surveillance: Personal                            history of colon cancer, Last colonoscopy: 2020,                            history of cecal cancer, s/p surgical resection Providers:                Ronnette Juniper, MD, Benay Pillow, RN, Cherylynn Ridges,                            Technician, Arnoldo Hooker, CRNA Referring MD:             Celene Kras Medicines:                Monitored Anesthesia Care Complications:            No immediate complications. Estimated blood loss:                            Minimal. Estimated Blood Loss:     Estimated blood loss was minimal. Procedure:                Pre-Anesthesia Assessment:                           - Prior to the procedure, a History and Physical                            was performed, and patient medications and                            allergies were reviewed. The patient's tolerance of                            previous anesthesia was also reviewed. The risks                            and benefits of the procedure and the sedation                            options and risks were discussed with the patient.                            All questions were answered, and informed consent                            was obtained. Prior Anticoagulants: The patient has  taken Eliquis (apixaban), last dose was 2 days                            prior to procedure. ASA Grade Assessment: III - A                            patient with severe systemic disease. After                            reviewing the risks and benefits, the patient was                            deemed in satisfactory condition to undergo the                             procedure.                           After obtaining informed consent, the colonoscope                            was passed under direct vision. Throughout the                            procedure, the patient's blood pressure, pulse, and                            oxygen saturations were monitored continuously. The                            PCF-HQ190L ([id number]) Olympus colonoscope was                            introduced through the anus and advanced to the the                            terminal ileum. The colonoscopy was performed                            without difficulty. The patient tolerated the                            procedure well. The quality of the bowel                            preparation was good. Scope In: 10:28:19 AM Scope Out: 10:45:01 AM Scope Withdrawal Time: 0 hours 13 minutes 38 seconds  Total Procedure Duration: 0 hours 16 minutes 42 seconds  Findings:      The perianal and digital rectal examinations were normal.      There was evidence of a prior end-to-side ileo-colonic anastomosis in       the cecum. This was patent and was characterized by healthy appearing       mucosa. The anastomosis was traversed.      The neo-terminal ileum appeared  normal.      A 6 mm polyp was found in the right colon near the anastomosis. The       polyp was sessile. The polyp was removed with a piecemeal technique       using a cold biopsy forceps. Resection and retrieval were complete.      A 7 mm polypoid lesion was found at the anastomosis. The lesion was       sessile. No bleeding was present. The polyp was removed with a hot       snare. Resection and retrieval were complete.      Non-bleeding internal hemorrhoids were found during retroflexion. The       hemorrhoids were large.      The exam was otherwise without abnormality. Impression:               - Patent end-to-side ileo-colonic anastomosis,                            characterized by healthy appearing  mucosa.                           - The examined portion of the ileum was normal.                           - One 6 mm polyp in the right colon, removed                            piecemeal using a cold biopsy forceps. Resected and                            retrieved.                           - Polypoid lesion at the colonic anastomosis.                            Complete removal was accomplished.                           - Non-bleeding internal hemorrhoids.                           - The examination was otherwise normal. Moderate Sedation:      Patient did not receive moderate sedation for this procedure, but       instead received monitored anesthesia care. Recommendation:           - Patient has a contact number available for                            emergencies. The signs and symptoms of potential                            delayed complications were discussed with the                            patient. Return to normal activities tomorrow.  Written discharge instructions were provided to the                            patient.                           - Resume regular diet.                           - Continue present medications.                           - Resume Eliquis (apixaban) at prior dose tomorrow.                           - Await pathology results.                           - Repeat colonoscopy for surveillance based on                            pathology results. Procedure Code(s):        --- Professional ---                           [phone], Colonoscopy, flexible; with removal of                            tumor(s), polyp(s), or other lesion(s) by snare                            technique                           45380, 53, Colonoscopy, flexible; with biopsy,                            single or multiple Diagnosis Code(s):        --- Professional ---                           M42.683, Personal history of other malignant                             neoplasm of large intestine                           Z98.0, Intestinal bypass and anastomosis status                           K64.8, Other hemorrhoids                           K63.5, Polyp of colon                           D49.0, Neoplasm of unspecified behavior of  digestive system CPT copyright 2019 American Medical Association. All rights reserved. The codes documented in this report are preliminary and upon coder review may  be revised to meet current compliance requirements. Ronnette Juniper, MD 12/08/2021 10:49:02 AM This report has been signed electronically. Number of Addenda: 0

## 2021-12-08 NOTE — Discharge Instructions (Signed)
YOU HAD AN ENDOSCOPIC PROCEDURE TODAY: Refer to the procedure report and other information in the discharge instructions given to you for any specific questions about what was found during the examination. If this information does not answer your questions, please call [facility] office at [phone] to clarify.   YOU SHOULD EXPECT: Some feelings of bloating in the abdomen. Passage of more gas than usual. Walking can help get rid of the air that was put into your GI tract during the procedure and reduce the bloating. If you had a lower endoscopy (such as a colonoscopy or flexible sigmoidoscopy) you may notice spotting of blood in your stool or on the toilet paper. Some abdominal soreness may be present for a day or two, also.  DIET: Your first meal following the procedure should be a light meal and then it is ok to progress to your normal diet. A half-sandwich or bowl of soup is an example of a good first meal. Heavy or fried foods are harder to digest and may make you feel nauseous or bloated. Drink plenty of fluids but you should avoid alcoholic beverages for 24 hours. If you had a esophageal dilation, please see attached instructions for diet.    ACTIVITY: Your care partner should take you home directly after the procedure. You should plan to take it easy, moving slowly for the rest of the day. You can resume normal activity the day after the procedure however YOU SHOULD NOT DRIVE, use power tools, machinery or perform tasks that involve climbing or major physical exertion for 24 hours (because of the sedation medicines used during the test).   SYMPTOMS TO REPORT IMMEDIATELY: A gastroenterologist can be reached at any hour. Please call [phone]  for any of the following symptoms:  Following lower endoscopy (colonoscopy, flexible sigmoidoscopy) Excessive amounts of blood in the stool  Significant tenderness, worsening of abdominal pains  Swelling of the abdomen that is new, acute  Fever of 100  or higher   FOLLOW UP:  If any biopsies were taken you will be contacted by phone or by letter within the next 1-3 weeks. Call [phone]  if you have not heard about the biopsies in 3 weeks.  Please also call with any specific questions about appointments or follow up tests. YOU HAD AN ENDOSCOPIC PROCEDURE TODAY: Refer to the procedure report and other information in the discharge instructions given to you for any specific questions about what was found during the examination. If this information does not answer your questions, please call [facility] office at [phone] to clarify.  

## 2021-12-09 ENCOUNTER — Encounter (HOSPITAL_COMMUNITY): Payer: Self-pay | Admitting: Gastroenterology

## 2021-12-09 DIAGNOSIS — Z961 Presence of intraocular lens: Secondary | ICD-10-CM | POA: Diagnosis not present

## 2021-12-09 DIAGNOSIS — H52203 Unspecified astigmatism, bilateral: Secondary | ICD-10-CM | POA: Diagnosis not present

## 2021-12-09 LAB — SURGICAL PATHOLOGY

## 2021-12-09 NOTE — Anesthesia Postprocedure Evaluation (Signed)
Anesthesia Post Note  Patient: Brian Horn  Procedure(s) Performed: COLONOSCOPY WITH PROPOFOL POLYPECTOMY     Patient location during evaluation: PACU Anesthesia Type: MAC Level of consciousness: awake and alert Pain management: pain level controlled Vital Signs Assessment: post-procedure vital signs reviewed and stable Respiratory status: spontaneous breathing, nonlabored ventilation, respiratory function stable and patient connected to nasal cannula oxygen Cardiovascular status: stable and blood pressure returned to baseline Postop Assessment: no apparent nausea or vomiting Anesthetic complications: no   No notable events documented.  Last Vitals:  Vitals:   12/08/21 1100 12/08/21 1110  BP: (!) 150/77 (!) 151/83  Pulse: 61 (!) 59  Resp: 12 12  Temp:    SpO2: 98% 99%    Last Pain:  Vitals:   12/08/21 1110  TempSrc:   PainSc: 0-No pain   Pain Goal:                   Alver Leete

## 2021-12-10 DIAGNOSIS — E538 Deficiency of other specified B group vitamins: Secondary | ICD-10-CM | POA: Diagnosis not present

## 2021-12-23 DIAGNOSIS — N401 Enlarged prostate with lower urinary tract symptoms: Secondary | ICD-10-CM | POA: Diagnosis not present

## 2021-12-23 DIAGNOSIS — R351 Nocturia: Secondary | ICD-10-CM | POA: Diagnosis not present

## 2021-12-23 DIAGNOSIS — N5201 Erectile dysfunction due to arterial insufficiency: Secondary | ICD-10-CM | POA: Diagnosis not present

## 2021-12-30 ENCOUNTER — Ambulatory Visit (INDEPENDENT_AMBULATORY_CARE_PROVIDER_SITE_OTHER): Payer: Medicare PPO

## 2021-12-30 DIAGNOSIS — I442 Atrioventricular block, complete: Secondary | ICD-10-CM | POA: Diagnosis not present

## 2021-12-30 LAB — CUP PACEART REMOTE DEVICE CHECK
Battery Remaining Longevity: 168 mo
Battery Voltage: 3.11 V
Brady Statistic AP VP Percent: 0.07 %
Brady Statistic AP VS Percent: 28.95 %
Brady Statistic AS VP Percent: 0.09 %
Brady Statistic AS VS Percent: 70.89 %
Brady Statistic RA Percent Paced: 29.69 %
Brady Statistic RV Percent Paced: 0.16 %
Date Time Interrogation Session: 20230905214305
Implantable Lead Implant Date: 20220824
Implantable Lead Implant Date: 20220824
Implantable Lead Location: 753859
Implantable Lead Location: 753860
Implantable Lead Model: 5076
Implantable Lead Model: 5076
Implantable Pulse Generator Implant Date: 20220824
Lead Channel Impedance Value: 304 Ohm
Lead Channel Impedance Value: 323 Ohm
Lead Channel Impedance Value: 437 Ohm
Lead Channel Impedance Value: 494 Ohm
Lead Channel Pacing Threshold Amplitude: 0.75 V
Lead Channel Pacing Threshold Amplitude: 0.75 V
Lead Channel Pacing Threshold Pulse Width: 0.4 ms
Lead Channel Pacing Threshold Pulse Width: 0.4 ms
Lead Channel Sensing Intrinsic Amplitude: 3.125 mV
Lead Channel Sensing Intrinsic Amplitude: 3.125 mV
Lead Channel Sensing Intrinsic Amplitude: 9.5 mV
Lead Channel Sensing Intrinsic Amplitude: 9.5 mV
Lead Channel Setting Pacing Amplitude: 1.5 V
Lead Channel Setting Pacing Amplitude: 2 V
Lead Channel Setting Pacing Pulse Width: 0.4 ms
Lead Channel Setting Sensing Sensitivity: 0.9 mV

## 2021-12-31 DIAGNOSIS — D539 Nutritional anemia, unspecified: Secondary | ICD-10-CM | POA: Diagnosis not present

## 2021-12-31 DIAGNOSIS — Z0000 Encounter for general adult medical examination without abnormal findings: Secondary | ICD-10-CM | POA: Diagnosis not present

## 2021-12-31 DIAGNOSIS — Z1331 Encounter for screening for depression: Secondary | ICD-10-CM | POA: Diagnosis not present

## 2021-12-31 DIAGNOSIS — N401 Enlarged prostate with lower urinary tract symptoms: Secondary | ICD-10-CM | POA: Diagnosis not present

## 2021-12-31 DIAGNOSIS — N529 Male erectile dysfunction, unspecified: Secondary | ICD-10-CM | POA: Diagnosis not present

## 2021-12-31 DIAGNOSIS — K219 Gastro-esophageal reflux disease without esophagitis: Secondary | ICD-10-CM | POA: Diagnosis not present

## 2021-12-31 DIAGNOSIS — I10 Essential (primary) hypertension: Secondary | ICD-10-CM | POA: Diagnosis not present

## 2021-12-31 DIAGNOSIS — E7800 Pure hypercholesterolemia, unspecified: Secondary | ICD-10-CM | POA: Diagnosis not present

## 2021-12-31 DIAGNOSIS — Z85038 Personal history of other malignant neoplasm of large intestine: Secondary | ICD-10-CM | POA: Diagnosis not present

## 2021-12-31 DIAGNOSIS — Z23 Encounter for immunization: Secondary | ICD-10-CM | POA: Diagnosis not present

## 2021-12-31 DIAGNOSIS — E538 Deficiency of other specified B group vitamins: Secondary | ICD-10-CM | POA: Diagnosis not present

## 2021-12-31 DIAGNOSIS — I480 Paroxysmal atrial fibrillation: Secondary | ICD-10-CM | POA: Diagnosis not present

## 2022-01-11 DIAGNOSIS — E538 Deficiency of other specified B group vitamins: Secondary | ICD-10-CM | POA: Diagnosis not present

## 2022-01-18 NOTE — Progress Notes (Signed)
Remote pacemaker transmission.   

## 2022-02-11 DIAGNOSIS — E538 Deficiency of other specified B group vitamins: Secondary | ICD-10-CM | POA: Diagnosis not present

## 2022-03-03 ENCOUNTER — Other Ambulatory Visit: Payer: Self-pay | Admitting: Cardiology

## 2022-03-03 NOTE — Telephone Encounter (Signed)
Prescription refill request for Eliquis received. Indication: AF Last office visit: 03/24/21  Elliot Cousin MD Scr: 1.05 on 12/31/21 Age: 86 Weight: 77.1kg  Based on above findings Eliquis '5mg'$  twice daily is the appropriate dose.  Pt is past due appt with Dr Curt Bears.  Meassage sent to schedulers to make appt.  Refill approved x 1

## 2022-03-15 DIAGNOSIS — E538 Deficiency of other specified B group vitamins: Secondary | ICD-10-CM | POA: Diagnosis not present

## 2022-03-31 ENCOUNTER — Ambulatory Visit (INDEPENDENT_AMBULATORY_CARE_PROVIDER_SITE_OTHER): Payer: Medicare PPO

## 2022-03-31 DIAGNOSIS — I442 Atrioventricular block, complete: Secondary | ICD-10-CM | POA: Diagnosis not present

## 2022-03-31 DIAGNOSIS — H401133 Primary open-angle glaucoma, bilateral, severe stage: Secondary | ICD-10-CM | POA: Diagnosis not present

## 2022-03-31 LAB — CUP PACEART REMOTE DEVICE CHECK
Battery Remaining Longevity: 165 mo
Battery Voltage: 3.07 V
Brady Statistic AP VP Percent: 0.01 %
Brady Statistic AP VS Percent: 31.77 %
Brady Statistic AS VP Percent: 0.03 %
Brady Statistic AS VS Percent: 68.2 %
Brady Statistic RA Percent Paced: 32.12 %
Brady Statistic RV Percent Paced: 0.04 %
Date Time Interrogation Session: 20231205215537
Implantable Lead Connection Status: 753985
Implantable Lead Connection Status: 753985
Implantable Lead Implant Date: 20220824
Implantable Lead Implant Date: 20220824
Implantable Lead Location: 753859
Implantable Lead Location: 753860
Implantable Lead Model: 5076
Implantable Lead Model: 5076
Implantable Pulse Generator Implant Date: 20220824
Lead Channel Impedance Value: 304 Ohm
Lead Channel Impedance Value: 323 Ohm
Lead Channel Impedance Value: 418 Ohm
Lead Channel Impedance Value: 513 Ohm
Lead Channel Pacing Threshold Amplitude: 0.625 V
Lead Channel Pacing Threshold Amplitude: 0.875 V
Lead Channel Pacing Threshold Pulse Width: 0.4 ms
Lead Channel Pacing Threshold Pulse Width: 0.4 ms
Lead Channel Sensing Intrinsic Amplitude: 2.375 mV
Lead Channel Sensing Intrinsic Amplitude: 2.375 mV
Lead Channel Sensing Intrinsic Amplitude: 7.375 mV
Lead Channel Sensing Intrinsic Amplitude: 7.375 mV
Lead Channel Setting Pacing Amplitude: 1.5 V
Lead Channel Setting Pacing Amplitude: 2 V
Lead Channel Setting Pacing Pulse Width: 0.4 ms
Lead Channel Setting Sensing Sensitivity: 0.9 mV
Zone Setting Status: 755011

## 2022-04-16 DIAGNOSIS — E538 Deficiency of other specified B group vitamins: Secondary | ICD-10-CM | POA: Diagnosis not present

## 2022-04-23 NOTE — Progress Notes (Signed)
Remote pacemaker transmission.   

## 2022-05-11 ENCOUNTER — Other Ambulatory Visit: Payer: Self-pay | Admitting: Gastroenterology

## 2022-05-12 ENCOUNTER — Telehealth: Payer: Self-pay | Admitting: *Deleted

## 2022-05-12 NOTE — Telephone Encounter (Signed)
Spoke with patient who states that he had lab work done yesterday and he will get the doctor's office to fax over the results.

## 2022-05-12 NOTE — Telephone Encounter (Signed)
   Pre-operative Risk Assessment    Patient Name: Brian Horn  DOB: 15-Mar-1936 MRN: [id number]      Request for Surgical Clearance    Procedure:   COLONOSCOPY  Date of Surgery:  Clearance 06/01/22                                 Surgeon:  DR. [facility] Surgeon's Group or Practice Name:  [facility] Phone number:  [phone] Fax number:  [phone]   Type of Clearance Requested:   - Pharmacy:  Hold Apixaban (Eliquis) 1-2 DAYS PRIOR   Type of Anesthesia:   PROPOFOL   Additional requests/questions:    Signed, Jeanann Lewandowsky   05/12/2022, 7:08 AM

## 2022-05-12 NOTE — Telephone Encounter (Signed)
Callback team please arrange for patient to have updated CBC and BMET completed before recommendation can be made regarding holding Eliquis.  Thank you

## 2022-05-15 ENCOUNTER — Other Ambulatory Visit: Payer: Self-pay | Admitting: Cardiology

## 2022-05-19 ENCOUNTER — Telehealth: Payer: Self-pay

## 2022-05-19 NOTE — Patient Instructions (Signed)
Visit Information  Thank you for taking time to visit with me today. Please don't hesitate to contact me if I can be of assistance to you.   Following are the goals we discussed today:   Goals Addressed             This Visit's Progress    COMPLETED: Care Coordination Activities - no follow up required       Care Coordination Interventions: Provided education to patient re: care coordination services Assessed social determinant of health barriers Completed annual wellness visit 12/31/21          If you are experiencing a Mental Health or [place] or need someone to talk to, please call the Suicide and Crisis Lifeline: 988 call the [place] National Suicide Prevention Lifeline: 418 383 1962 or TTY: 510-170-8744 TTY (410)818-4533) to talk to a trained counselor call 1-800-273-TALK (toll free, 24 hour hotline) go to [facility] [place] [phone]) call 911   Patient verbalizes understanding of instructions and care plan provided today and agrees to view in [place]. Active MyChart status and patient understanding of how to access instructions and care plan via MyChart confirmed with patient.     No further follow up required:    Peter Garter RN, Jackquline Denmark, [facility] [phone]

## 2022-05-24 NOTE — Telephone Encounter (Signed)
Labs were in [place]. I have copied the results into the chart.   RESULTS  RESULTS Component Value Reference Range Notes  CBC with Diff Reviewed date:05/17/2022 08:27:37 AM Interpretation: Performing Lab: Notes/Report: Testing Performed at: [facility], [address], [address], [place], [place] [address]  WBC 7.6 4.0-11.0 K/ul    RBC 4.13 4.20-5.80 M/uL    HGB 13.6 13.0-17.0 g/dL    HCT 41.0 39.0-52.0 %    MCV 99.2 80.0-94.0 fL    MCH 33.0 27.0-33.0 pg    MCHC 33.3 32.0-36.0 g/dL    RDW 14.3 11.5-15.5 %    PLT 168 150-400 K/uL    MPV 8.3 7.5-10.7 fL    NE% 69.8 43.3-71.9 %    LY% 18.1 16.8-43.5 %    MO% 11.0 4.6-12.4 %    EO% 0.9 0.0-7.8 %    BA% 0.2 0.0-1.0 %    NE# 5.3 1.9-7.2 K/uL    LY# 1.40 1.10-2.70 K/uL    MO# 0.8 0.3-0.8 K/uL    EO# 0.1 0.0-0.6 K/uL    BA# 0.0 0.0-0.1 K/uL    NRBC% 0.10      NRBC# 0.01      Comp Metabolic Panel Reviewed HGDJ:24/26/8341 08:27:37 AM Interpretation: Performing Lab: Notes/Report: Testing Performed at: [facility], [address], [address], [place], [place] [address]  Glucose 121 70-99 mg/dL    BUN 16 6-26 mg/dL    Creatinine 0.95 0.60-1.30 mg/dl    [id number] 79 >60 calc In accordance with recommendations from NKF-ASN Task Force, [facility] has updated its eGFR calc to the 2021 CKD-EDI equation that estimates kidney function without a race variable;Stage 1 > 90 ML/Min plus Albuminuria;Stage 2 60-89 ML/MIN;Stage 3 30-59 ML/MIN;Stage 4 15-29 ML/MIN;Stage 5 <15 ML/MIN  Sodium 139 136-145 mmol/L    Potassium 4.2 3.5-5.5 mmol/L    Chloride 108 98-107 mmol/L    CO2 24 22-32 mmol/L    Anion Gap 11.8 6.0-20.0 mmol/L    Calcium 9.5 8.6-10.3 mg/dL    CA-corrected 9.27 8.60-10.30 mg/dL    Protein, Total 6.5 6.0-8.3 g/dL    Albumin 4.2 3.4-4.8 g/dL    TBIL 0.6 0.3-1.0 mg/dL    ALP 75 38-126 U/L    AST 16 0-39 U/L    ALT 15 0-52 U/L    Iron Panel Reviewed date:05/17/2022 08:27:37 AM Interpretation: Performing  Lab: Notes/Report: Testing Performed at: [facility], [address], [address], [place], [place] [address]  Iron, serum 109 50-212 ug/dL    TRFN 173 203-362 mg/dL    IRSAT 45 20-55 %    TIBC 242 250-450 ug/dL    Ferritin Reviewed date:05/17/2022 08:27:37 AM Interpretation: Performing Lab: Notes/Report: Testing Performed at: [facility], [address], [place], [place] [address]  FERR 25.9 23.9-336.2 ng/mL

## 2022-05-24 NOTE — Telephone Encounter (Signed)
I s/w the pt about labs that needed to be done for pre op clearance. Pt states he had labs dione with Dr. Therisa Doyne office almost 2 weeks ago and they were supposed to fax over the lab results to cardiology. I assured the pt that I will call Dr. Encarnacion Slates office and get the labs faxed over ASAP so that we may finalize clearance. Pt said thank you.   I tried x 4 to get through to [facility] but the line was busy. I did secure chat Dr. Therisa Doyne about the labs. She will have someone fax the labs to our office for pre op to review. Fax # [phone].

## 2022-05-24 NOTE — Telephone Encounter (Signed)
Called patient and he is scheduled to see Deberah Pelton, NP on 05/26/2022 at 8:50 AM. Patient stated that he is scheduled for the procedure on 06/01/22

## 2022-05-24 NOTE — Telephone Encounter (Signed)
Pt has appt 05/26/22 with Coletta Memos, FNP. I will update all parties involved.

## 2022-05-24 NOTE — Telephone Encounter (Signed)
Clearance has been forwarded to Pharm D for guidance on holding [place].   Patient has not been since since 02/2021. He has an appointment with Dr. Curt Bears on 06/21/22. Would recommend that he postpone procedure until after office visit unless it is urgent.   Emmaline Life, NP-C  05/24/2022, 4:48 PM [address] Office [phone] Fax [phone]

## 2022-05-25 ENCOUNTER — Encounter (HOSPITAL_COMMUNITY): Payer: Self-pay | Admitting: Gastroenterology

## 2022-05-25 NOTE — Telephone Encounter (Signed)
Patient with diagnosis of afib on Eliquis for anticoagulation.    Procedure: 06/01/22 Date of procedure: colonoscopy   CHA2DS2-VASc Score = 3   This indicates a 3.2% annual risk of stroke. The patient's score is based upon: CHF History: 0 HTN History: 1 Diabetes History: 0 Stroke History: 0 Vascular Disease History: 0 Age Score: 2 Gender Score: 0      CrCl 63 ml/min  Per office protocol, patient can hold Eliquis for 1-2 days prior to procedure.    **This guidance is not considered finalized until pre-operative APP has relayed final recommendations.**

## 2022-05-26 ENCOUNTER — Encounter: Payer: Self-pay | Admitting: General Practice

## 2022-05-26 ENCOUNTER — Ambulatory Visit: Payer: Medicare PPO | Attending: General Practice | Admitting: Student

## 2022-05-26 VITALS — BP 116/68 | HR 69 | Ht 72.0 in | Wt 174.8 lb

## 2022-05-26 DIAGNOSIS — R001 Bradycardia, unspecified: Secondary | ICD-10-CM

## 2022-05-26 DIAGNOSIS — Z01810 Encounter for preprocedural cardiovascular examination: Secondary | ICD-10-CM

## 2022-05-26 DIAGNOSIS — Z950 Presence of cardiac pacemaker: Secondary | ICD-10-CM

## 2022-05-26 DIAGNOSIS — I10 Essential (primary) hypertension: Secondary | ICD-10-CM | POA: Diagnosis not present

## 2022-05-26 DIAGNOSIS — I480 Paroxysmal atrial fibrillation: Secondary | ICD-10-CM | POA: Diagnosis not present

## 2022-05-26 DIAGNOSIS — E785 Hyperlipidemia, unspecified: Secondary | ICD-10-CM

## 2022-05-26 NOTE — Progress Notes (Signed)
Cardiology Clinic Note   Patient Name: Brian Horn Date of Encounter: 05/26/2022  Primary Care Provider:  Corliss Blacker, MD Primary Cardiologist:  None  Patient Profile    Brian Horn is a 87 y.o. male with a past medical history of PAF s/p cardioversion January 2019 on anticoagulation, bradycardia s/p PPM 2022, hypertension, hyperlipidemia, colon cancer who presents to the clinic today for preoperative cardiac risk assessment.  Past Medical History    Past Medical History:  Diagnosis Date   Blood dyscrasia    hemachromatosis, takes tx q year   ED (erectile dysfunction)    GERD (gastroesophageal reflux disease)    Glaucoma    right eye   Hematuria, gross    History of kidney stones    Hyperlipidemia    Hypertension    Inguinal hernia    Paroxysmal atrial fibrillation (HCC)    Peyronie's disease    Pulmonic stenosis    mild   Tinnitus    Past Surgical History:  Procedure Laterality Date   APPENDECTOMY     CARDIOVERSION N/A 04/29/2017   Procedure: CARDIOVERSION;  Surgeon: [name], MD;  Location: [facility];  Service: Cardiovascular;  Laterality: N/A;   CHOLECYSTECTOMY     COLONOSCOPY WITH PROPOFOL N/A 12/08/2021   Procedure: COLONOSCOPY WITH PROPOFOL;  Surgeon: Ronnette Juniper, MD;  Location: [facility];  Service: Gastroenterology;  Laterality: N/A;   colonscopy     CYSTOSCOPY WITH INSERTION OF UROLIFT N/A 03/15/2017   Procedure: CYSTOSCOPY WITH INSERTION OF UROLIFT;  Surgeon: [name], MD;  Location: [facility];  Service: Urology;  Laterality: N/A;   EYE SURGERY     ioc for cataracts   LAPAROSCOPIC RIGHT COLECTOMY Right 07/11/2017   Procedure: LAPAROSCOPIC RIGHT COLECTOMY, REPAIR LAPAROSCOPIC  ABDOMINAL HERNIA;  Surgeon: Alphonsa Overall, MD;  Location: [facility];  Service: General;  Laterality: Right;   PACEMAKER IMPLANT N/A 12/17/2020   Procedure: PACEMAKER IMPLANT;  Surgeon: Constance Haw, MD;  Location: [facility];  Service: Cardiovascular;  Laterality: N/A;   POLYPECTOMY  12/08/2021   Procedure: POLYPECTOMY;  Surgeon: Ronnette Juniper, MD;  Location: [facility];  Service: Gastroenterology;;    Allergies  Allergies  Allergen Reactions   Lipitor [Atorvastatin Calcium] Other (See Comments)    cramping    History of Present Illness    Brian Horn has a past medical history of: PAF. Successful DCCV 04/29/2017. Bradycardia. Pacemaker implantation 12/17/2020: Medtronic dual chamber pacemaker.  Remote device check 03/30/2022: Normal device function, stable battery and leads.  Hypertension. Hyperlipidemia. Lipid panel 12/31/2021: LDL 60, HDL 60, TG 74, total 134. Colon cancer.   Mr. Brian Horn was first evaluated by Dr. Curt Bears on 02/24/2017 for A-fib at the request of Dr. Laurann Montana. He underwent successful cardioversion in January 2019. He developed QRS widening and a left bundle branch pattern on Flecainide so that was discontinued. In September 2021 patient followed up with Dr. Curt Bears with complaints of weakness and fatigue and was found to be in 2-1 AV block. In July 2022 patient followed up with Oda Kilts, PA-C with complaints of dizziness and bradycardia. He underwent pacemaker implantation in August 2021.   Patient was last seen in the office by Dr. Curt Bears on 03/24/2021 for follow-up.  He was doing well after pacemaker implantation and no medication changes were made.  Today, patient is doing well. Patient denies shortness of breath or dyspnea on exertion. No chest pain, pressure, or tightness. Denies lower extremity edema, orthopnea,  or PND. No palpitations. No dizziness, lightheadedness, presyncope, or syncope. 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 He occasionally walks on the treadmill when he has time.    Home Medications    Current Meds  Medication Sig   acetaminophen (TYLENOL) 500 MG tablet Take 500-1,000 mg by mouth every 6 (six) hours as needed (for pain.).    apixaban (ELIQUIS) 5 MG TABS tablet TAKE 1 TABLET BY MOUTH TWICE A DAY   benazepril (LOTENSIN) 20 MG tablet TAKE 1 TABLET BY MOUTH EVERYDAY AT BEDTIME   brimonidine (ALPHAGAN) 0.2 % ophthalmic solution Place 1 drop into both eyes 2 (two) times daily.   Cholecalciferol (VITAMIN D3) 1000 units CAPS Take 1,000 Units by mouth at bedtime.    Cinnamon 500 MG TABS Take 500 mg by mouth at bedtime.    Coenzyme Q10 (CO Q-10) 100 MG CAPS Take 100 mg by mouth daily.   Cyanocobalamin (B-12 COMPLIANCE INJECTION) 1000 MCG/ML KIT Inject 1 mL as directed every 30 (thirty) days.   finasteride (PROSCAR) 5 MG tablet Take 5 mg by mouth daily.   lansoprazole (PREVACID) 15 MG capsule Take 15 mg by mouth daily before breakfast.   Multiple Vitamin (MULTIVITAMIN WITH MINERALS) TABS tablet Take 1 tablet by mouth every other day.   Probiotic Product (ALIGN) 4 MG CAPS daily.   sildenafil (VIAGRA) 100 MG tablet Take 100 mg by mouth as needed for erectile dysfunction.   simvastatin (ZOCOR) 10 MG tablet Take 10 mg by mouth at bedtime.    VYZULTA 0.024 % SOLN Place 1 drop into both eyes daily.    Family History    Family History  Problem Relation Age of Onset   Heart disease Mother    Heart disease Father    He indicated that his mother is deceased. He indicated that his father is deceased. He indicated that his maternal grandmother is deceased. He indicated that his maternal grandfather is deceased. He indicated that his paternal grandmother is deceased. He indicated that his paternal grandfather is deceased.   Social History    Social History   Socioeconomic History   Marital status: Married    Spouse name: Not on file   Number of children: Not on file   Years of education: Not on file   Highest education level: Not on file  Occupational History   Not on file  Tobacco Use   Smoking status: Former    Packs/day: 0.50    Years: 15.00    Total pack years: 7.50    Types: Cigarettes    Quit date: 07/21/1971     Years since quitting: 50.8   Smokeless tobacco: Never  Vaping Use   Vaping Use: Never used  Substance and Sexual Activity   Alcohol use: Yes    Comment: 2 glasses wine per day   Drug use: No   Sexual activity: Not on file  Other Topics Concern   Not on file  Social History Narrative   Not on file   Social Determinants of Health   Financial Resource Strain: Not on file  Food Insecurity: No Food Insecurity (05/19/2022)   Hunger Vital Sign    Worried About Running Out of Food in the Last Year: Never true    Ran Out of Food in the Last Year: Never true  Transportation Needs: No Transportation Needs (05/19/2022)   PRAPARE - Hydrologist (Medical): No    Lack of Transportation (Non-Medical): No  Physical  Activity: Not on file  Stress: Not on file  Social Connections: Not on file  Intimate Partner Violence: Not on file     Review of Systems    General:  No chills, fever, night sweats or weight changes.  Cardiovascular:  No chest pain, dyspnea on exertion, edema, orthopnea, palpitations, paroxysmal nocturnal dyspnea. Dermatological: No rash, lesions/masses Respiratory: No cough, dyspnea Urologic: No hematuria, dysuria Abdominal:   No nausea, vomiting, diarrhea, bright red blood per rectum, melena, or hematemesis Neurologic:  No visual changes, weakness, changes in mental status. All other systems reviewed and are otherwise negative except as noted above.  Physical Exam    VS:  BP 116/68 (BP Location: Left Arm, Patient Position: Sitting, Cuff Size: Normal)   Pulse 69   Ht 6' (1.829 m)   Wt 174 lb 12.8 oz (79.3 kg)   BMI 23.71 kg/m  , BMI Body mass index is 23.71 kg/m. GEN:  Well nourished, well developed, in no acute distress. HEENT: Normal. Neck: Supple, no JVD, carotid bruits, or masses. Cardiac: RRR, no murmurs, rubs, or gallops. No clubbing, cyanosis, edema.  Radials/DP/PT 2+ and equal bilaterally.  Respiratory:  Respirations regular and  unlabored, clear to auscultation bilaterally. GI: Soft, nontender, nondistended. MS: No deformity or atrophy. Skin: Warm and dry, no rash. Neuro: Strength and sensation are intact. Psych: Normal affect.  Accessory Clinical Findings    Recent Labs: From [facility]: 05/11/2022: Hemoglobin 13.6, ALT 15, creatinine 0.950, potassium 4.2.  Recent Lipid Panel Panel from [facility] 12/31/2021: LDL 60, HDL 60, TG 74, total 134.     ECG personally reviewed by me today: V-paced, rate 69 bpm.     CHA2DS2-VASc Score = 3   This indicates a 3.2% annual risk of stroke. The patient's score is based upon: CHF History: 0 HTN History: 1 Diabetes History: 0 Stroke History: 0 Vascular Disease History: 0 Age Score: 2 Gender Score: 0      Assessment & Plan    PAF. S/p successful cardioversion January 2019. Patient denies palpitations. Continue Eliquis. Bradycardia.  S/p pacemaker implantation August 2022.  Remote device check December 2023 showed normal device function with stable battery and leads.  Patient denies dizziness, lightheadedness, presyncope or syncope. HR today 69 bpm. Hypertension.  BP today 116/68. Patient denies headaches or dizziness. Continue benazepril. Hyperlipidemia. LDL September 2023 60, at goal. Continue simvastatin.  Preoperative cardiovascular risk assessment. Colonoscopy scheduled for 06/01/2021. Chart reviewed as part of pre-operative protocol coverage. Patient does not have a history of ischemic heart disease, PCI, heart failure, or stroke. According to the RCRI, patient has a 0.4% risk of MACE. Patient reports activity equivalent to 4.0 METS (working, walking on treadmill). Given past medical history and based on ACC/AHA guidelines, ELIGIO ANGERT would be at acceptable risk for the planned procedure without further cardiovascular testing. Patient was advised that if he develops new symptoms prior to surgery to contact our office to arrange a follow-up appointment.  He verbalized  understanding. Anti-coagulation. On Eliquis for PAF. Per Pharm D.: Procedure: 06/01/22 Date of procedure: colonoscopy   CHA2DS2-VASc Score = 3   This indicates a 3.2% annual risk of stroke. The patient's score is based upon: CHF History: 0 HTN History: 1 Diabetes History: 0 Stroke History: 0 Vascular Disease History: 0 Age Score: 2 Gender Score: 0   CrCl 63 ml/min Per office protocol, patient can hold Eliquis for 1-2 days prior to procedure.    I will route this recommendation to the requesting party via Epic  fax function.     Disposition: Follow-up with Dr. Curt Bears in 6 months or sooner as needed.    Justice Britain. Dorina Ribaudo, DNP, NP-C     05/26/2022, 9:01 AM [address] Office [phone] Fax [phone]

## 2022-05-26 NOTE — Patient Instructions (Signed)
Medication Instructions:  The current medical regimen is effective;  continue present plan and medications as directed. Please refer to the Current Medication list given to you today.  *If you need a refill on your cardiac medications before your next appointment, please call your pharmacy*  Lab Work: NONE If you have labs (blood work) drawn today and your tests are completely normal, you will receive your results only by: [place] (if you have MyChart) OR A paper copy in the mail If you have any lab test that is abnormal or we need to change your treatment, we will call you to review the results.  Testing/Procedures: NONE  Follow-Up: At [facility], you and your health needs are our priority.  As part of our continuing mission to provide you with exceptional heart care, we have created designated Provider Care Teams.  These Care Teams include your primary Cardiologist (physician) and Advanced Practice Providers (APPs -  Physician Assistants and Nurse Practitioners) who all work together to provide you with the care you need, when you need it.  Your next appointment:   6 month(s)  Provider:   DR Curt Bears     Other Instructions

## 2022-05-29 ENCOUNTER — Other Ambulatory Visit: Payer: Self-pay | Admitting: Cardiology

## 2022-05-31 NOTE — H&P (Signed)
History of Present Illness General:         87 year old male presents for evaluation of guaiac positive stool.        History of A-fib, hypertension, hyperlipidemia.        History of dual-chamber pacemaker implanted 12/17/2020        On Eliquis due to history of A-fib.        History of colon cancer (cecal adenocarcinoma diagnosed 2019, resected and no chemotherapy required)        EGD 2019-normal        Colonoscopy 09/12/2026-tubular adenoma, internal hemorrhoids, 2-year recall        Colonoscopy 12/08/2021 shows 1 adenoma removed, no repeat due to age        CBC normal 05/2021        CBC 12/31/2021 shows macrocytic anemia with Hgb 12.1, MCV 101.8        BUN 14, creatinine 1.05        Ferritin 22.8        FOBT guaiac + 02/11/2022        Patient states he does not have black stools or rectal bleeding        He does not have GI issues        Denies dysphagia        Denies reflux        Denies nausea/vomiting        Denies abdominal pain        States he is here and would like an EGD for further evaluation of heme positive stool and anemia        Echocardiogram 10/2020 shows ejection fraction 65 to 70%. Vital Signs Wt: 174.2, Ht: 72, BMI: 23.62, Temp: 97.7, Pulse sitting: 81, BP sitting: 114/66. Examination Gastroenterology Exam:        GENERAL APPEARANCE: Well developed, well nourished, no active distress, pleasant, no acute distress . EYES: Lids and conjunctiva normal. Sclera normal, pupils equal and reactive. RESPIRATORY Breath sounds normal. Respiration even and unlabored. CARDIOVASCULAR Normal RRR w/o murmers or gallops. No peripheral edema. SKIN Warm and dry. PSYCHIATRIC Alert and oriented x3, mood and affect appear normal..  Assessments 1. Heme positive stool - R19.5 (Primary) 2. Anemia, unspecified type - D64.9 Treatment 1. Heme positive stool       LAB: CBC with Diff (Collection Date & Time - 05/11/2022 09:34 AM)      LAB: Comp Metabolic Panel (Collection Date & Time - 05/11/2022  09:34 AM)      LAB: Ferritin (Collection Date & Time - 05/11/2022 09:34 AM)      LAB: Iron Panel (Collection Date & Time - 05/11/2022 09:34 AM)      IMAGING: EGD Clinical Notes: Heme positive stool with no overt signs of bleeding. Colonoscopy 11/2021 showed 1 tubular adenoma. Suspect that heme positivity and anemia could be multifactorial since anemia is macrocytic but there is low ferritin. Patient is also on Eliquis which could have triggered stool study to be positive. Patient appears healthy enough to undergo EGD at this time but will do at hospital for extra precaution.  The procedure EGD was thoroughly explained to the patient, including the risks, benefits and alternatives. We discussed the complications including Bleeding and perforation, sedation problems and missing something. The patient verbalized understanding and agree.            Referral To:               Reason:EGD -propofol-Dr. Therisa Doyne  2. Anemia,  unspecified type       LAB: CBC with Diff (Collection Date & Time - 05/11/2022 09:34 AM)      LAB: Comp Metabolic Panel (Collection Date & Time - 05/11/2022 09:34 AM)      LAB: Ferritin (Collection Date & Time - 05/11/2022 09:34 AM)      LAB: Iron Panel (Collection Date & Time - 05/11/2022 09:34 AM)      IMAGING: EGD Clinical Notes: Heme positive stool with no overt signs of bleeding. Colonoscopy 11/2021 showed 1 tubular adenoma. Suspect that heme positivity and anemia could be multifactorial since anemia is macrocytic but there is low ferritin. Patient is also on Eliquis which could have triggered stool study to be positive. Patient appears healthy enough to undergo EGD at this time but will do at hospital for extra precaution.  The procedure EGD was thoroughly explained to the patient, including the risks, benefits and alternatives. We discussed the complications including Bleeding and perforation, sedation problems and missing something. The patient verbalized understanding and  agree.  Will recheck lab work since last labs were 4 months ago    3. Others            Referral To:               Reason:EGD -propofol-Dr. Therisa Doyne Procedures Venipuncture:         Venipuncture:  Alexis Frock 05/11/2022 09:42:46 AM >, performed in right arm, 23 g butterfly, number of attempts: 1, blood sent to [facility].  Labs        Lab: Iron Panel (Collection Date & Time - 05/11/2022 09:34 AM)                     Value Reference Range      IRON BINDING CAPACITY, TOTAL 242 L 250-450 - ug/dL      IRON 109  50-212 - ug/dL      IRON SATURATION 45  20-55 - %      TRANSFERRIN 173 L 203-362 - mg/dL MCMICHAEL, BAYLEY 05/17/2022 08:27:16 AM > hgb has imrpoved and returned to normal. iron studies look great.      Lab: Ferritin (Collection Date & Time - 05/11/2022 09:34 AM)                     Value Reference Range      FERRITIN 25.9  23.9-336.2 - ng/mL MCMICHAEL, BAYLEY 05/17/2022 08:27:16 AM > hgb has imrpoved and returned to normal. iron studies look great.      Lab: Comp Metabolic Panel (Collection Date & Time - 05/11/2022 09:34 AM)                     Value Reference Range      GLUCOSE 121 H 70-99 - mg/dL      BUN 16  6-26 - mg/dL      CREATININE 0.95  0.60-1.30 - mg/dl      eGFR 79  >60 - calc      SODIUM 139  136-145 - mmol/L      POTASSIUM 4.2  3.5-5.5 - mmol/L      CHLORIDE 108 H 98-107 - mmol/L      C02 24  22-32 - mmol/L      ANION GAP 11.8  6.0-20.0 - mmol/L      CALCIUM 9.5  8.6-10.3 - mg/dL      CA-Alb corrected 9.27  8.60-10.30 - mg/dL  T PROTEIN 6.5  6.0-8.3 - g/dL      ALBUMIN 4.2  3.4-4.8 - g/dL      T.BILI 0.6  0.3-1.0 - mg/dL      ALP 75  38-126 - U/L      AST 16  0-39 - U/L      ALT 15  0-52 - U/L MCMICHAEL, BAYLEY 05/17/2022 08:27:16 AM > hgb has imrpoved and returned to normal. iron studies look great.      Lab: CBC with Diff (Collection Date & Time - 05/11/2022 09:34 AM)                     Value Reference Range      WBC 7.6  4.0-11.0 -  K/ul      RBC 4.13 L 4.20-5.80 - M/uL      HGB 13.6  13.0-17.0 - g/dL      HCT 41.0  39.0-52.0 - %      MCV 99.2 H 80.0-94.0 - fL      MCH 33.0  27.0-33.0 - pg      MCHC 33.3  32.0-36.0 - g/dL      RDW 14.3  11.5-15.5 - %      PLT 168  150-400 - K/uL      MPV 8.3  7.5-10.7 - fL      NRBC# 0.01  -      NEUT % 69.8  43.3-71.9 - %      NRBC% 0.10  - %      LYMPH% 18.1  16.8-43.5 - %      MONO % 11.0  4.6-12.4 - %      EOS % 0.9  0.0-7.8 - %      BASO % 0.2  0.0-1.0 - %      NEUT # 5.3  1.9-7.2 - K/uL      LYMPH# 1.40  1.10-2.70 - K/uL      MONO # 0.8  0.3-0.8 - K/uL      EOS # 0.1  0.0-0.6 - K/uL      BASO # 0.0  0.0-0.1 - K/uL

## 2022-05-31 NOTE — Telephone Encounter (Signed)
Prescription refill request for Eliquis received. Indication: AF Last office visit: 05/26/22 D Wittenborn NP Scr: 0.95 on 05/11/22 Age: 87 Weight: 79.3KG  Based on above findings Eliquis '5mg'$  twice daily is the appropriate dose.  Refill approved.

## 2022-06-01 ENCOUNTER — Encounter (HOSPITAL_COMMUNITY)
Admission: RE | Disposition: A | Discharge status: Home / Self Care | Payer: Self-pay | Source: Ambulatory Visit | Attending: Gastroenterology

## 2022-06-01 ENCOUNTER — Ambulatory Visit (HOSPITAL_COMMUNITY): Payer: Medicare PPO | Admitting: Anesthesiology

## 2022-06-01 ENCOUNTER — Encounter (HOSPITAL_COMMUNITY): Payer: Self-pay | Admitting: Gastroenterology

## 2022-06-01 ENCOUNTER — Other Ambulatory Visit: Payer: Self-pay

## 2022-06-01 ENCOUNTER — Ambulatory Visit (HOSPITAL_BASED_OUTPATIENT_CLINIC_OR_DEPARTMENT_OTHER): Payer: Medicare PPO | Admitting: Anesthesiology

## 2022-06-01 ENCOUNTER — Ambulatory Visit (HOSPITAL_COMMUNITY)
Admission: RE | Admit: 2022-06-01 | Discharge: 2022-06-01 | Disposition: A | Discharge status: Home / Self Care | Payer: Medicare PPO | Source: Ambulatory Visit | Attending: Gastroenterology | Admitting: Gastroenterology

## 2022-06-01 DIAGNOSIS — Z950 Presence of cardiac pacemaker: Secondary | ICD-10-CM | POA: Insufficient documentation

## 2022-06-01 DIAGNOSIS — Z85038 Personal history of other malignant neoplasm of large intestine: Secondary | ICD-10-CM | POA: Diagnosis not present

## 2022-06-01 DIAGNOSIS — I10 Essential (primary) hypertension: Secondary | ICD-10-CM | POA: Diagnosis not present

## 2022-06-01 DIAGNOSIS — R195 Other fecal abnormalities: Secondary | ICD-10-CM | POA: Insufficient documentation

## 2022-06-01 DIAGNOSIS — K317 Polyp of stomach and duodenum: Secondary | ICD-10-CM | POA: Diagnosis not present

## 2022-06-01 DIAGNOSIS — K3189 Other diseases of stomach and duodenum: Secondary | ICD-10-CM

## 2022-06-01 DIAGNOSIS — E785 Hyperlipidemia, unspecified: Secondary | ICD-10-CM | POA: Insufficient documentation

## 2022-06-01 DIAGNOSIS — K219 Gastro-esophageal reflux disease without esophagitis: Secondary | ICD-10-CM | POA: Diagnosis not present

## 2022-06-01 DIAGNOSIS — I4891 Unspecified atrial fibrillation: Secondary | ICD-10-CM

## 2022-06-01 DIAGNOSIS — Z87891 Personal history of nicotine dependence: Secondary | ICD-10-CM | POA: Insufficient documentation

## 2022-06-01 DIAGNOSIS — Z7901 Long term (current) use of anticoagulants: Secondary | ICD-10-CM | POA: Insufficient documentation

## 2022-06-01 HISTORY — PX: ESOPHAGOGASTRODUODENOSCOPY (EGD) WITH PROPOFOL: SHX5813

## 2022-06-01 HISTORY — PX: BIOPSY: SHX5522

## 2022-06-01 SURGERY — ESOPHAGOGASTRODUODENOSCOPY (EGD) WITH PROPOFOL
Anesthesia: Monitor Anesthesia Care

## 2022-06-01 MED ORDER — LIDOCAINE 2% (20 MG/ML) 5 ML SYRINGE
INTRAMUSCULAR | Status: DC | PRN
  Administered 2022-06-01: 80 mg via INTRAVENOUS

## 2022-06-01 MED ORDER — PROPOFOL 10 MG/ML IV BOLUS
INTRAVENOUS | Status: DC | PRN
  Administered 2022-06-01 (×2): 40 mg via INTRAVENOUS
  Administered 2022-06-01: 20 mg via INTRAVENOUS

## 2022-06-01 MED ORDER — PROPOFOL 10 MG/ML IV BOLUS
INTRAVENOUS | Status: AC
  Filled 2022-06-01: qty 20

## 2022-06-01 MED ORDER — SODIUM CHLORIDE 0.9 % IV SOLN: INTRAVENOUS | Status: DC

## 2022-06-01 MED ORDER — LACTATED RINGERS IV SOLN: INTRAVENOUS | Status: DC

## 2022-06-01 SURGICAL SUPPLY — 15 items

## 2022-06-01 NOTE — Interval H&P Note (Signed)
History and Physical Interval Note: 86/male with occult blood in stool on eliquis for an EGD with propofol.  06/01/2022 9:27 AM  Brian Horn  has presented today for EGD, with the diagnosis of Heme positive stool, anemia.  The various methods of treatment have been discussed with the patient and family. After consideration of risks, benefits and other options for treatment, the patient has consented to  Procedure(s): ESOPHAGOGASTRODUODENOSCOPY (EGD) WITH PROPOFOL (N/A) as a surgical intervention.  The patient's history has been reviewed, patient examined, no change in status, stable for surgery.  I have reviewed the patient's chart and labs.  Questions were answered to the patient's satisfaction.     Ronnette Juniper

## 2022-06-01 NOTE — Transfer of Care (Signed)
Immediate Anesthesia Transfer of Care Note  Patient: Brian Horn  Procedure(s) Performed: ESOPHAGOGASTRODUODENOSCOPY (EGD) WITH PROPOFOL BIOPSY  Patient Location: PACU  Anesthesia Type:MAC  Level of Consciousness: awake, alert , and oriented  Airway & Oxygen Therapy: Patient Spontanous Breathing and Patient connected to face mask oxygen  Post-op Assessment: Report given to RN and Post -op Vital signs reviewed and stable  Post vital signs: Reviewed and stable  Last Vitals:  Vitals Value Taken Time  BP    Temp    Pulse    Resp    SpO2      Last Pain:  Vitals:   06/01/22 0910  TempSrc: Temporal  PainSc: 0-No pain         Complications: No notable events documented.

## 2022-06-01 NOTE — Anesthesia Preprocedure Evaluation (Signed)
Anesthesia Evaluation  Patient identified by MRN, date of birth, ID band Patient awake    Reviewed: Allergy & Precautions, NPO status , Patient's Chart, lab work & pertinent test results  Airway Mallampati: II  TM Distance: >3 FB Neck ROM: Full    Dental  (+) Teeth Intact, Dental Advisory Given   Pulmonary former smoker   breath sounds clear to auscultation- rhonchi       Cardiovascular hypertension, Pt. on medications + dysrhythmias Atrial Fibrillation + pacemaker + Valvular Problems/Murmurs  Rhythm:Regular Rate:Normal  '19 Exercise Stress - Blood pressure demonstrated a normal response to exercise. ETT on flecainide. Baseline ECG LBBB. No QRS widening or arrhythmia. Non diagnostic for ischemia. Did have 2 mm ST depression in inferior lateral leads, but BBB and max HR 86% make non diagnostic for ischemia  '17 TTE - Mild LVH. EF 65% to 70%. Grade 1 diastolic   dysfunction. AV sclerosis without stenosis. Trivial TR.  Mild pulmonic stenosis. Peak  gradient of 32 mmHg.  3/23 Normal remote reviewed. Battery and lead parameters stable.    Neuro/Psych Tinnitus  negative psych ROS   GI/Hepatic Neg liver ROS,GERD  Medicated and Controlled,,  Endo/Other  negative endocrine ROS    Renal/GU Nephrolithaisis  negative genitourinary   Musculoskeletal negative musculoskeletal ROS (+)    Abdominal   Peds  Hematology  (+) Blood dyscrasia, anemia Hematochromatosis   Anesthesia Other Findings   Reproductive/Obstetrics                             Anesthesia Physical Anesthesia Plan  ASA: 3  Anesthesia Plan: MAC   Post-op Pain Management:    Induction: Intravenous  PONV Risk Score and Plan: 4 or greater and Treatment may vary due to age or medical condition and Propofol infusion  Airway Management Planned: Natural Airway and Nasal Cannula  Additional Equipment: None  Intra-op Plan:    Post-operative Plan:   Informed Consent: I have reviewed the patients History and Physical, chart, labs and discussed the procedure including the risks, benefits and alternatives for the proposed anesthesia with the patient or authorized representative who has indicated his/her understanding and acceptance.     Dental advisory given  Plan Discussed with: Anesthesiologist and CRNA  Anesthesia Plan Comments:         Anesthesia Quick Evaluation

## 2022-06-01 NOTE — Anesthesia Postprocedure Evaluation (Signed)
Anesthesia Post Note  Patient: Brian Horn  Procedure(s) Performed: ESOPHAGOGASTRODUODENOSCOPY (EGD) WITH PROPOFOL BIOPSY     Patient location during evaluation: PACU Anesthesia Type: MAC Level of consciousness: awake and alert Pain management: pain level controlled Vital Signs Assessment: post-procedure vital signs reviewed and stable Respiratory status: spontaneous breathing, nonlabored ventilation and respiratory function stable Cardiovascular status: blood pressure returned to baseline and stable Postop Assessment: no apparent nausea or vomiting Anesthetic complications: no   No notable events documented.  Last Vitals:  Vitals:   06/01/22 1000 06/01/22 1005  BP: 114/67 128/67  Pulse: (!) 59 (!) 59  Resp: 16 17  Temp:    SpO2: 96% 96%    Last Pain:  Vitals:   06/01/22 1005  TempSrc:   PainSc: 0-No pain                 Lynda Rainwater

## 2022-06-01 NOTE — Op Note (Signed)
[facility] Patient Name: Brian Horn Procedure Date: 06/01/2022 MRN: [id number] Attending MD: Ronnette Juniper , MD, [id number] Date of Birth: [birth year] CSN: [id number] Age: 87 Admit Type: Inpatient Procedure:                Upper GI endoscopy Indications:              Heme positive stool Providers:                Ronnette Juniper, MD, Helane Rima, RN, Luan Moore, Technician, [facility], CRNA Referring MD:             Annie Sable Medicines:                Monitored Anesthesia Care Complications:            No immediate complications. Estimated blood loss:                            Minimal. Estimated Blood Loss:     Estimated blood loss was minimal. Procedure:                Pre-Anesthesia Assessment:                           - Prior to the procedure, a History and Physical                            was performed, and patient medications and                            allergies were reviewed. The patient's tolerance of                            previous anesthesia was also reviewed. The risks                            and benefits of the procedure and the sedation                            options and risks were discussed with the patient.                            All questions were answered, and informed consent                            was obtained. Prior Anticoagulants: The patient has                            taken Eliquis (apixaban), last dose was 3 days                            prior to procedure. ASA Grade Assessment: III - A  patient with severe systemic disease. After                            reviewing the risks and benefits, the patient was                            deemed in satisfactory condition to undergo the                            procedure.                           After obtaining informed consent, the endoscope was                            passed under direct  vision. Throughout the                            procedure, the patient's blood pressure, pulse, and                            oxygen saturations were monitored continuously. The                            [id number]-H190 ([id number]) Olympus endoscope was introduced                            through the mouth, and advanced to the second part                            of duodenum. The upper GI endoscopy was                            accomplished without difficulty. The patient                            tolerated the procedure well. Scope In: Scope Out: Findings:      The examined esophagus was normal.      The Z-line was regular and was found 42 cm from the incisors.      Diffuse mildly erythematous mucosa without bleeding was found in the       gastric body and in the gastric antrum. Biopsies were taken with a cold       forceps for Helicobacter pylori testing.      Multiple large pedunculated and sessile polyps with no bleeding and no       stigmata of recent bleeding were found in the cardia, in the gastric       fundus and in the gastric body. Biopsies were taken with a cold forceps       for histology.      The examined duodenum was normal. Impression:               - Normal esophagus.                           - Z-line regular, 42 cm  from the incisors.                           - Erythematous mucosa in the gastric body and                            antrum. Biopsied.                           - Multiple gastric polyps. Biopsied.                           - Normal examined duodenum. Moderate Sedation:      Patient did not receive moderate sedation for this procedure, but       instead received monitored anesthesia care. Recommendation:           - Await pathology results.                           - Resume Eliquis (apixaban) at prior dose tomorrow.                           - Patient has a contact number available for                            emergencies. The signs and symptoms of  potential                            delayed complications were discussed with the                            patient. Return to normal activities tomorrow.                            Written discharge instructions were provided to the                            patient.                           - Resume regular diet. Procedure Code(s):        --- Professional ---                           [phone], Esophagogastroduodenoscopy, flexible,                            transoral; with biopsy, single or multiple Diagnosis Code(s):        --- Professional ---                           K31.89, Other diseases of stomach and duodenum                           K31.7, Polyp of stomach and duodenum  R19.5, Other fecal abnormalities CPT copyright 2022 American [employer] Association. All rights reserved. The codes documented in this report are preliminary and upon coder review may  be revised to meet current compliance requirements. Ronnette Juniper, MD 06/01/2022 9:52:04 AM This report has been signed electronically. Number of Addenda: 0

## 2022-06-02 LAB — SURGICAL PATHOLOGY

## 2022-06-04 ENCOUNTER — Encounter (HOSPITAL_COMMUNITY): Payer: Self-pay | Admitting: Gastroenterology

## 2022-06-16 ENCOUNTER — Other Ambulatory Visit: Payer: Self-pay | Admitting: Cardiology

## 2022-06-21 ENCOUNTER — Ambulatory Visit: Payer: Medicare PPO | Attending: Cardiology | Admitting: Cardiology

## 2022-06-21 ENCOUNTER — Encounter: Payer: Self-pay | Admitting: Cardiology

## 2022-06-21 VITALS — BP 146/74 | HR 71 | Ht 72.0 in | Wt 176.0 lb

## 2022-06-21 DIAGNOSIS — I441 Atrioventricular block, second degree: Secondary | ICD-10-CM | POA: Diagnosis not present

## 2022-06-21 DIAGNOSIS — D6869 Other thrombophilia: Secondary | ICD-10-CM | POA: Diagnosis not present

## 2022-06-21 DIAGNOSIS — I4819 Other persistent atrial fibrillation: Secondary | ICD-10-CM

## 2022-06-21 DIAGNOSIS — I10 Essential (primary) hypertension: Secondary | ICD-10-CM | POA: Diagnosis not present

## 2022-06-21 NOTE — Progress Notes (Signed)
Electrophysiology Office Note   Date:  06/21/2022   ID:  OLUWASEYI Horn, DOB 1936-01-12, MRN [id number]  PCP:  Corliss Blacker, MD  Cardiologist:   Primary Electrophysiologist:  Ogle Hoeffner Meredith [name], MD    No chief complaint on file.     History of Present Illness: Brian Horn is a 87 y.o. male who is being seen today for the evaluation of atrial fibrillation at the request of [name], MD. Presenting today for electrophysiology evaluation.    He has a history significant for persistent atrial fibrillation, hypertension, hyperlipidemia.  He was initially on flecainide but had significant QRS widening.  Flecainide was stopped.  He has a history of colon cancer and is post laparoscopic cholecystectomy in 2019.  He presented to clinic in 2-1 AV block and is post Medtronic dual-chamber pacemaker implanted 12/17/2020.  Today, denies symptoms of palpitations, chest pain, shortness of breath, orthopnea, PND, lower extremity edema, claudication, dizziness, presyncope, syncope, bleeding, or neurologic sequela. The patient is tolerating medications without difficulties.  Has been feeling well without complaint.  He has had minimal episodes of atrial fibrillation.   Past Medical History:  Diagnosis Date   Blood dyscrasia    hemachromatosis, takes tx q year   ED (erectile dysfunction)    GERD (gastroesophageal reflux disease)    Glaucoma    right eye   Hematuria, gross    History of kidney stones    Hyperlipidemia    Hypertension    Inguinal hernia    Paroxysmal atrial fibrillation (HCC)    Peyronie's disease    Pulmonic stenosis    mild   Tinnitus    Past Surgical History:  Procedure Laterality Date   APPENDECTOMY     BIOPSY  06/01/2022   Procedure: BIOPSY;  Surgeon: Ronnette Juniper, MD;  Location: [facility];  Service: Gastroenterology;;   CARDIOVERSION N/A 04/29/2017   Procedure: CARDIOVERSION;  Surgeon: [name], MD;  Location: [facility];  Service:  Cardiovascular;  Laterality: N/A;   CHOLECYSTECTOMY     COLONOSCOPY WITH PROPOFOL N/A 12/08/2021   Procedure: COLONOSCOPY WITH PROPOFOL;  Surgeon: Ronnette Juniper, MD;  Location: [facility];  Service: Gastroenterology;  Laterality: N/A;   colonscopy     CYSTOSCOPY WITH INSERTION OF UROLIFT N/A 03/15/2017   Procedure: CYSTOSCOPY WITH INSERTION OF UROLIFT;  Surgeon: [name], MD;  Location: [facility];  Service: Urology;  Laterality: N/A;   ESOPHAGOGASTRODUODENOSCOPY (EGD) WITH PROPOFOL N/A 06/01/2022   Procedure: ESOPHAGOGASTRODUODENOSCOPY (EGD) WITH PROPOFOL;  Surgeon: Ronnette Juniper, MD;  Location: [facility];  Service: Gastroenterology;  Laterality: N/A;   EYE SURGERY     ioc for cataracts   LAPAROSCOPIC RIGHT COLECTOMY Right 07/11/2017   Procedure: LAPAROSCOPIC RIGHT COLECTOMY, REPAIR LAPAROSCOPIC  ABDOMINAL HERNIA;  Surgeon: Alphonsa Overall, MD;  Location: [facility];  Service: General;  Laterality: Right;   PACEMAKER IMPLANT N/A 12/17/2020   Procedure: PACEMAKER IMPLANT;  Surgeon: Constance Haw, MD;  Location: [place] [facility];  Service: Cardiovascular;  Laterality: N/A;   POLYPECTOMY  12/08/2021   Procedure: POLYPECTOMY;  Surgeon: Ronnette Juniper, MD;  Location: [facility];  Service: Gastroenterology;;     Current Outpatient Medications  Medication Sig Dispense Refill   acetaminophen (TYLENOL) 500 MG tablet Take 500-1,000 mg by mouth every 6 (six) hours as needed (for pain.).     amLODipine (NORVASC) 5 MG tablet Take 5 mg by mouth every evening.     apixaban (ELIQUIS) 5 MG TABS tablet TAKE 1 TABLET  BY MOUTH TWICE A DAY 180 tablet 1   benazepril (LOTENSIN) 20 MG tablet TAKE 1 TABLET BY MOUTH EVERYDAY AT BEDTIME 90 tablet 1   brimonidine (ALPHAGAN) 0.2 % ophthalmic solution Place 1 drop into both eyes 2 (two) times daily.     Cholecalciferol (VITAMIN D3) 1000 units CAPS Take 1,000 Units by mouth at bedtime.      Cinnamon 500 MG TABS Take 500 mg by mouth at bedtime.       Coenzyme Q10 (CO Q-10 PO) Take 1 capsule by mouth every evening.     cyanocobalamin (VITAMIN B12) 1000 MCG/ML injection Inject 1,000 mcg into the muscle every 30 (thirty) days.     finasteride (PROSCAR) 5 MG tablet Take 5 mg by mouth at bedtime.     lansoprazole (PREVACID) 15 MG capsule Take 15 mg by mouth daily before breakfast.     Multiple Vitamin (MULTIVITAMIN WITH MINERALS) TABS tablet Take 1 tablet by mouth every evening.     Probiotic Product (ALIGN) 4 MG CAPS Take 4 mg by mouth in the morning.     simvastatin (ZOCOR) 20 MG tablet Take 10 mg by mouth every evening.     VYZULTA 0.024 % SOLN Place 1 drop into both eyes in the morning.     No current facility-administered medications for this visit.    Allergies:   Lipitor [atorvastatin calcium]   Social History:  The patient  reports that he quit smoking about 50 years ago. His smoking use included cigarettes. He has a 7.50 pack-year smoking history. He has never used smokeless tobacco. He reports current alcohol use. He reports that he does not use drugs.   Family History:  The patient's family history includes Heart disease in his father and mother.   ROS:  Please see the history of present illness.   Otherwise, review of systems is positive for none.   All other systems are reviewed and negative.   PHYSICAL EXAM: VS:  BP (!) 146/74   Pulse 71   Ht 6' (1.829 m)   Wt 176 lb (79.8 kg)   SpO2 97%   BMI 23.87 kg/m  , BMI Body mass index is 23.87 kg/m. GEN: Well nourished, well developed, in no acute distress  HEENT: normal  Neck: no JVD, carotid bruits, or masses Cardiac: RRR; no murmurs, rubs, or gallops,no edema  Respiratory:  clear to auscultation bilaterally, normal work of breathing GI: soft, nontender, nondistended, + BS MS: no deformity or atrophy  Skin: warm and dry, device site well healed Neuro:  Strength and sensation are intact Psych: euthymic mood, full affect  EKG:  EKG is not ordered today. Personal  review of the ekg ordered 05/26/22 shows AF, V paced  Personal review of the device interrogation today. Results in [place]: No results found for requested labs within last 365 days.    Lipid Panel  No results found for: "CHOL", "TRIG", "HDL", "CHOLHDL", "VLDL", "LDLCALC", "LDLDIRECT"   Wt Readings from Last 3 Encounters:  06/21/22 176 lb (79.8 kg)  05/26/22 174 lb 12.8 oz (79.3 kg)  12/08/21 169 lb 15.6 oz (77.1 kg)      Other studies Reviewed: Additional studies/ records that were reviewed today include: TTE 08/27/15  Review of the above records today demonstrates:  - Left ventricle: The cavity size was normal. Wall thickness was   increased in a pattern of mild LVH. Systolic function was   vigorous. The estimated ejection fraction was in the range  of 65%   to 70%. Wall motion was normal; there were no regional wall   motion abnormalities. Doppler parameters are consistent with   abnormal left ventricular relaxation (grade 1 diastolic   dysfunction). The E/e&' ratio is <8, suggesting normal LV filling   pressure. - Aortic valve: Sclerosis without stenosis. Transvalvular velocity   was minimally increased. Mean gradient (S): 8 mm Hg. Peak   gradient (S): 17 mm Hg. - Left atrium: The atrium was normal in size. - Atrial septum: No defect or patent foramen ovale was identified. - Tricuspid valve: There was trivial regurgitation. - Pulmonic valve: Poorly visualized. Mild pulmonic stenosis. Peak   gradient of 32 mmHg. - Pulmonary arteries: Poorly visualized. PA peak pressure: 11 mm Hg   (S). - Inferior vena cava: The vessel was normal in size. The   respirophasic diameter changes were in the normal range (= 50%),   consistent with normal central venous pressure.   ASSESSMENT AND PLAN:  1.  Persistent atrial fibrillation: Currently on Eliquis and diltiazem.  Was previously on flecainide but QRS widened.  CHA2DS2-VASc of 3.  Remains in sinus rhythm.  2.  2-1 AV  block: Status post Medtronic dual-chamber pacemaker implanted 12/17/2020.  Sensing, threshold, impedance within normal limits.  No changes at this time.  3.  Hypertension: Currently well-controlled  4.  Second hypercoagulable state: Currently on Eliquis for atrial fibrillation  Current medicines are reviewed at length with the patient today.   The patient does not have concerns regarding his medicines.  The following changes were made today: None  Labs/ tests ordered today include:  No orders of the defined types were placed in this encounter.     Disposition:   FU with Kelyn Koskela 6 months  Signed, Bitania Shankland Meredith [name], MD  06/21/2022 4:45 PM     [place] [address] [phone] (office) [phone] (fax)

## 2022-06-30 ENCOUNTER — Ambulatory Visit: Payer: Medicare PPO

## 2022-06-30 DIAGNOSIS — I441 Atrioventricular block, second degree: Secondary | ICD-10-CM

## 2022-06-30 LAB — CUP PACEART REMOTE DEVICE CHECK
Battery Remaining Longevity: 161 mo
Battery Voltage: 3.05 V
Brady Statistic AP VP Percent: 0.02 %
Brady Statistic AP VS Percent: 21.98 %
Brady Statistic AS VP Percent: 0.15 %
Brady Statistic AS VS Percent: 77.85 %
Brady Statistic RA Percent Paced: 22.31 %
Brady Statistic RV Percent Paced: 0.17 %
Date Time Interrogation Session: 20240305224516
Implantable Lead Connection Status: 753985
Implantable Lead Connection Status: 753985
Implantable Lead Implant Date: 20220824
Implantable Lead Implant Date: 20220824
Implantable Lead Location: 753859
Implantable Lead Location: 753860
Implantable Lead Model: 5076
Implantable Lead Model: 5076
Implantable Pulse Generator Implant Date: 20220824
Lead Channel Impedance Value: 285 Ohm
Lead Channel Impedance Value: 304 Ohm
Lead Channel Impedance Value: 399 Ohm
Lead Channel Impedance Value: 456 Ohm
Lead Channel Pacing Threshold Amplitude: 0.625 V
Lead Channel Pacing Threshold Amplitude: 0.875 V
Lead Channel Pacing Threshold Pulse Width: 0.4 ms
Lead Channel Pacing Threshold Pulse Width: 0.4 ms
Lead Channel Sensing Intrinsic Amplitude: 2.75 mV
Lead Channel Sensing Intrinsic Amplitude: 2.75 mV
Lead Channel Sensing Intrinsic Amplitude: 6.75 mV
Lead Channel Sensing Intrinsic Amplitude: 6.75 mV
Lead Channel Setting Pacing Amplitude: 1.5 V
Lead Channel Setting Pacing Amplitude: 2 V
Lead Channel Setting Pacing Pulse Width: 0.4 ms
Lead Channel Setting Sensing Sensitivity: 0.9 mV
Zone Setting Status: 755011

## 2022-08-03 NOTE — Progress Notes (Signed)
Remote pacemaker transmission.   

## 2022-08-25 DIAGNOSIS — L728 Other follicular cysts of the skin and subcutaneous tissue: Secondary | ICD-10-CM | POA: Diagnosis not present

## 2022-08-25 DIAGNOSIS — Z85828 Personal history of other malignant neoplasm of skin: Secondary | ICD-10-CM | POA: Diagnosis not present

## 2022-08-25 DIAGNOSIS — L570 Actinic keratosis: Secondary | ICD-10-CM | POA: Diagnosis not present

## 2022-08-25 DIAGNOSIS — D1801 Hemangioma of skin and subcutaneous tissue: Secondary | ICD-10-CM | POA: Diagnosis not present

## 2022-08-25 DIAGNOSIS — L814 Other melanin hyperpigmentation: Secondary | ICD-10-CM | POA: Diagnosis not present

## 2022-08-25 DIAGNOSIS — L723 Sebaceous cyst: Secondary | ICD-10-CM | POA: Diagnosis not present

## 2022-08-25 DIAGNOSIS — L821 Other seborrheic keratosis: Secondary | ICD-10-CM | POA: Diagnosis not present

## 2022-09-21 DIAGNOSIS — E538 Deficiency of other specified B group vitamins: Secondary | ICD-10-CM | POA: Diagnosis not present

## 2022-09-29 ENCOUNTER — Ambulatory Visit (INDEPENDENT_AMBULATORY_CARE_PROVIDER_SITE_OTHER): Payer: Medicare PPO

## 2022-09-29 DIAGNOSIS — I441 Atrioventricular block, second degree: Secondary | ICD-10-CM

## 2022-09-29 LAB — CUP PACEART REMOTE DEVICE CHECK
Battery Remaining Longevity: 155 mo
Battery Voltage: 3.02 V
Brady Statistic RA Percent Paced: 0.3 %
Brady Statistic RV Percent Paced: 96.98 %
Date Time Interrogation Session: 20240605205958
Implantable Lead Connection Status: 753985
Implantable Lead Connection Status: 753985
Implantable Lead Implant Date: 20220824
Implantable Lead Implant Date: 20220824
Implantable Lead Location: 753859
Implantable Lead Location: 753860
Implantable Lead Model: 5076
Implantable Lead Model: 5076
Implantable Pulse Generator Implant Date: 20220824
Lead Channel Impedance Value: 304 Ohm
Lead Channel Impedance Value: 304 Ohm
Lead Channel Impedance Value: 418 Ohm
Lead Channel Impedance Value: 570 Ohm
Lead Channel Pacing Threshold Amplitude: 0.75 V
Lead Channel Pacing Threshold Amplitude: 1 V
Lead Channel Pacing Threshold Pulse Width: 0.4 ms
Lead Channel Pacing Threshold Pulse Width: 0.4 ms
Lead Channel Sensing Intrinsic Amplitude: 1.125 mV
Lead Channel Sensing Intrinsic Amplitude: 1.125 mV
Lead Channel Sensing Intrinsic Amplitude: 7.75 mV
Lead Channel Sensing Intrinsic Amplitude: 7.75 mV
Lead Channel Setting Pacing Amplitude: 1.75 V
Lead Channel Setting Pacing Amplitude: 2 V
Lead Channel Setting Pacing Pulse Width: 0.4 ms
Lead Channel Setting Sensing Sensitivity: 0.9 mV
Zone Setting Status: 755011

## 2022-09-30 ENCOUNTER — Telehealth: Payer: Self-pay

## 2022-09-30 NOTE — Telephone Encounter (Signed)
Following alert received from [employer] received for AF continuous since 08/20/2022, on [facility] according to previous reports, good ventricular rate control.  Sent to triage due to persistence.  Patient denies any symptoms and reports he has been doing well and compliant with all medications. Did not send to AF clinic since patient will be due to see Dr. Kathlynn Grate APP within next 2 months. Will await recommendations.  EP scheduling. Patient has recall in for 06/16/23, although per Dr. Elberta Fortis last OV note on 06/21/22 ~ patient needed a 6 month follow up.

## 2022-10-21 ENCOUNTER — Other Ambulatory Visit: Payer: Self-pay | Admitting: Cardiology

## 2022-10-21 DIAGNOSIS — I4819 Other persistent atrial fibrillation: Secondary | ICD-10-CM

## 2022-10-21 NOTE — Telephone Encounter (Signed)
Eliquis 5mg  refill request received. Patient is 87 years old, weight-79.8kg, Crea-0.95 on 05/11/22 via [facility] from [place], [place], and last seen by Dr. Elberta Fortis on 05/26/22. Dose is appropriate based on dosing criteria. Will send in refill to requested pharmacy.    Indications for use not on original refill request but it continue to come up once refill sent.

## 2022-10-22 DIAGNOSIS — E538 Deficiency of other specified B group vitamins: Secondary | ICD-10-CM | POA: Diagnosis not present

## 2022-10-22 NOTE — Progress Notes (Signed)
Remote pacemaker transmission.   

## 2022-11-22 DIAGNOSIS — E538 Deficiency of other specified B group vitamins: Secondary | ICD-10-CM | POA: Diagnosis not present

## 2022-11-30 ENCOUNTER — Encounter: Payer: Self-pay | Admitting: Cardiology

## 2022-11-30 ENCOUNTER — Ambulatory Visit: Payer: Medicare PPO | Attending: Cardiology | Admitting: Cardiology

## 2022-11-30 VITALS — BP 120/82 | HR 69 | Ht 72.0 in | Wt 171.6 lb

## 2022-11-30 DIAGNOSIS — I10 Essential (primary) hypertension: Secondary | ICD-10-CM

## 2022-11-30 DIAGNOSIS — Z79899 Other long term (current) drug therapy: Secondary | ICD-10-CM

## 2022-11-30 DIAGNOSIS — I441 Atrioventricular block, second degree: Secondary | ICD-10-CM

## 2022-11-30 DIAGNOSIS — D6869 Other thrombophilia: Secondary | ICD-10-CM

## 2022-11-30 DIAGNOSIS — I4819 Other persistent atrial fibrillation: Secondary | ICD-10-CM | POA: Diagnosis not present

## 2022-11-30 LAB — CUP PACEART INCLINIC DEVICE CHECK
Date Time Interrogation Session: 20240806095002
Implantable Lead Connection Status: 753985
Implantable Lead Connection Status: 753985
Implantable Lead Implant Date: 20220824
Implantable Lead Implant Date: 20220824
Implantable Lead Location: 753859
Implantable Lead Location: 753860
Implantable Lead Model: 5076
Implantable Lead Model: 5076
Implantable Pulse Generator Implant Date: 20220824

## 2022-11-30 LAB — CBC

## 2022-11-30 LAB — BASIC METABOLIC PANEL
BUN/Creatinine Ratio: 13 (ref 10–24)
BUN: 15 mg/dL (ref 8–27)
CO2: 22 mmol/L (ref 20–29)
Calcium: 9.8 mg/dL (ref 8.6–10.2)
Chloride: 106 mmol/L (ref 96–106)
Creatinine, Ser: 1.12 mg/dL (ref 0.76–1.27)
Glucose: 96 mg/dL (ref 70–99)
Potassium: 4.2 mmol/L (ref 3.5–5.2)
Sodium: 142 mmol/L (ref 134–144)
eGFR: 64 mL/min/{1.73_m2} (ref >59)

## 2022-11-30 NOTE — Progress Notes (Signed)
Electrophysiology Office Note:   Date:  11/30/2022  ID:  [name], DOB [birth year], MRN [id number]  Primary Cardiologist: None Electrophysiologist: Zackariah Vanderpol Jorja Loa, MD      History of Present Illness:   Brian Horn is a 87 y.o. male with h/o atrial fibrillation, 2-1 AV block seen today for routine electrophysiology followup.  Since last being seen in our clinic the patient reports doing well.  He notes no major issues.  He has been more fatigued over the last few months.  Based on device interrogation, he went into atrial fibrillation in April.  He states that his fatigue has been going on for about that long.  He would like to get back into normal rhythm.  He did attribute his fatigue to his age.  he denies chest pain, palpitations, dyspnea, PND, orthopnea, nausea, vomiting, dizziness, syncope, edema, weight gain, or early satiety.      He has a history significant for persistent atrial fibrillation, hypertension, hyperlipidemia. He was initially on flecainide but had significant QRS widening. Flecainide was stopped. He has a history of colon cancer and is post laparoscopic cholecystectomy in 2019. He presented to clinic in 2-1 AV block and is post Medtronic dual-chamber pacemaker implanted 12/17/2020.        Review of systems complete and found to be negative unless listed in HPI.      EP Information / Studies Reviewed:    EKG is ordered today. Personal review as below.      PPM Interrogation-  reviewed in detail today,  See PACEART report.  Device History: Medtronic Single Chamber PPM implanted 12/17/2020 for Second Degree AV block  Risk Assessment/Calculations:    CHA2DS2-VASc Score = 3   This indicates a 3.2% annual risk of stroke. The patient's score is based upon: CHF History: 0 HTN History: 1 Diabetes History: 0 Stroke History: 0 Vascular Disease History: 0 Age Score: 2 Gender Score: 0             Physical Exam:   VS:  BP 120/82 (BP Location: Left  Arm, Patient Position: Sitting, Cuff Size: Normal)   Pulse 69   Ht 6' (1.829 m)   Wt 171 lb 9.6 oz (77.8 kg)   SpO2 97%   BMI 23.27 kg/m    Wt Readings from Last 3 Encounters:  11/30/22 171 lb 9.6 oz (77.8 kg)  06/21/22 176 lb (79.8 kg)  05/26/22 174 lb 12.8 oz (79.3 kg)     GEN: Well nourished, well developed in no acute distress NECK: No JVD; No carotid bruits CARDIAC: Regular rate and rhythm, no murmurs, rubs, gallops RESPIRATORY:  Clear to auscultation without rales, wheezing or rhonchi  ABDOMEN: Soft, non-tender, non-distended EXTREMITIES:  No edema; No deformity   ASSESSMENT AND PLAN:    1.  2-1 Second Degree AV block s/p Medtronic PPM  Normal PPM function See Pace Art report No changes today  2.  Persistent atrial fibrillation: Currently on Eliquis and diltiazem.  QRS widened and thus flecainide was stopped.  He is unfortunately in atrial fibrillation today.  He feels fatigued and short of breath.  He would like a rhythm control strategy.  We discussed medication options including amiodarone versus Tikosyn as he did not tolerate flecainide.  He Ambrose Wile discuss this further with his family and let us know which she would prefer.  3.  Hypertension: Currently well-controlled  4.  Secondary hypercoagulable state: Currently on Eliquis for atrial fibrillation  Disposition:   Follow up with Afib  Clinic in 6 months  Signed, Azjah Pardo Jorja Loa, MD

## 2022-11-30 NOTE — Patient Instructions (Addendum)
Medication Instructions:  Your physician recommends that you continue on your current medications as directed. Please refer to the Current Medication list given to you today.  *If you need a refill on your cardiac medications before your next appointment, please call your pharmacy*   Lab Work: Today:  BMET & CBC If you have labs (blood work) drawn today and your tests are completely normal, you will receive your results only by: MyChart Message (if you have MyChart) OR A paper copy in the mail If you have any lab test that is abnormal or we need to change your treatment, we will call you to review the results.   Testing/Procedures: None ordered   Follow-Up: At [facility], you and your health needs are our priority.  As part of our continuing mission to provide you with exceptional heart care, we have created designated Provider Care Teams.  These Care Teams include your primary Cardiologist (physician) and Advanced Practice Providers (APPs -  Physician Assistants and Nurse Practitioners) who all work together to provide you with the care you need, when you need it.   Your next appointment:   6 month(s)  The format for your next appointment:   In Person  Provider:   You will follow up in the Atrial Fibrillation Clinic located at [facility]. Your provider will be: Clint R. Fenton, PA-C  Or  Landry Mellow, PA   Thank you for choosing [facility]!!   Dory Horn, RN [phone]  Other Instructions  Amiodarone Tablets What is this medication? AMIODARONE (a MEE oh da rone) prevents and treats a fast or irregular heartbeat (arrhythmia). It works by slowing down overactive electric signals in the heart, which stabilizes your heart rhythm. It belongs to a group of medications called antiarrhythmics. This medicine may be used for other purposes; ask your health care provider or pharmacist if you have questions. COMMON BRAND NAME(S): Cordarone, Pacerone What  should I tell my care team before I take this medication? They need to know if you have any of these conditions: Liver disease Lung disease Other heart problems Thyroid disease An unusual or allergic reaction to amiodarone, iodine, other medications, foods, dyes, or preservatives Pregnant or trying to get pregnant Breast-feeding How should I use this medication? Take this medication by mouth with water. Take it as directed on the prescription label at the same time every day. You can take it with or without food. You should always take it the same way. Keep taking it unless your care team tells you to stop. A special MedGuide will be given to you by the pharmacist with each prescription and refill. Be sure to read this information carefully each time. Talk to your care team about the use of this medication in children. Special care may be needed. Overdosage: If you think you have taken too much of this medicine contact a poison control center or emergency room at once. NOTE: This medicine is only for you. Do not share this medicine with others. What if I miss a dose? If you miss a dose, take it as soon as you can. If it is almost time for your next dose, take only that dose. Do not take double or extra doses. What may interact with this medication? Do not take this medication with any of the following: Abarelix Apomorphine Arsenic trioxide Certain antibiotics, such as erythromycin, gemifloxacin, levofloxacin, or pentamidine Certain medications for depression, such as amoxapine or tricyclic antidepressants Certain medications for fungal infections, such as  fluconazole, itraconazole, ketoconazole, posaconazole, or voriconazole Certain medications for irregular heartbeat, such as disopyramide, dronedarone, ibutilide, propafenone, or sotalol Certain medications for malaria, such as chloroquine or halofantrine Cisapride Droperidol Haloperidol [place]orn Maprotiline Methadone Phenothiazines,  such as chlorpromazine, mesoridazine, or thioridazine Pimozide Ranolazine Red yeast rice Vardenafil This medication may also interact with the following: Antivirals for HIV Certain medications for blood pressure, heart disease, irregular heartbeat Certain medications for cholesterol, such as atorvastatin, cerivastatin, lovastatin, or simvastatin Certain medications for hepatitis C, such as sofosbuvir and ledipasvir; sofosbuvir Certain medications for seizures, such as phenytoin Certain medications for thyroid problems Certain medications that prevent or treat blood clots, such as warfarin Cholestyramine Cimetidine Clopidogrel Cyclosporine Dextromethorphan Diuretics Dofetilide Fentanyl General anesthetics Grapefruit juice Lidocaine Loratadine Methotrexate Other medications that cause heart rhythm changes Procainamide Quinidine Rifabutin, rifampin, or rifapentine St. John's Wort Trazodone Ziprasidone This list may not describe all possible interactions. Give your health care provider a list of all the medicines, herbs, non-prescription drugs, or dietary supplements you use. Also tell them if you smoke, drink alcohol, or use illegal drugs. Some items may interact with your medicine. What should I watch for while using this medication? Your condition will be monitored closely when you first begin therapy. This medication is often started in a hospital or other monitored health care setting. Once you are on maintenance therapy, visit your care team for regular checks on your progress. Because your condition and use of this medication carry some risk, it is a good idea to carry an identification card, necklace, or bracelet with details of your condition, medications, and care team. This medication may affect your coordination, reaction time, or judgment. Do not drive or operate machinery until you know how this medication affects you. Sit up or stand slowly to reduce the risk of dizzy  or fainting spells. Drinking alcohol with this medication can increase the risk of these side effects. This medication can make you more sensitive to the sun. Keep out of the sun. If you cannot avoid being in the sun, wear protective clothing and sunscreen. Do not use sun lamps, tanning beds, or tanning booths. You should have regular eye exams before and during treatment. Call your care team if you have blurred vision, see halos, or your eyes become sensitive to light. Your eyes may get dry. It may be helpful to use a lubricating eye solution or artificial tears solution. If you are going to have surgery or a procedure that requires contrast dyes, tell your care team that you are taking this medication. What side effects may I notice from receiving this medication? Side effects that you should report to your care team as soon as possible: Allergic reactions--skin rash, itching, hives, swelling of the face, lips, tongue, or throat Bluish-gray skin Change in vision such as blurry vision, seeing halos around lights, vision loss Heart failure--shortness of breath, swelling of the ankles, feet, or hands, sudden weight gain, unusual weakness or fatigue Heart rhythm changes--fast or irregular heartbeat, dizziness, feeling faint or lightheaded, chest pain, trouble breathing High thyroid levels (hyperthyroidism)--fast or irregular heartbeat, weight loss, excessive sweating or sensitivity to heat, tremors or shaking, anxiety, nervousness, irregular menstrual cycle or spotting Liver injury--right upper belly pain, loss of appetite, nausea, light-colored stool, dark yellow or brown urine, yellowing skin or eyes, unusual weakness or fatigue Low thyroid levels (hypothyroidism)--unusual weakness or fatigue, sensitivity to cold, constipation, hair loss, dry skin, weight gain, feelings of depression Lung injury--shortness of breath or trouble breathing, cough, spitting up  blood, chest pain, fever Pain, tingling, or  numbness in the hands or feet, muscle weakness, trouble walking, loss of balance or coordination Side effects that usually do not require medical attention (report to your care team if they continue or are bothersome): Nausea Vomiting This list may not describe all possible side effects. Call your doctor for medical advice about side effects. You may report side effects to FDA at 1-800-FDA-1088. Where should I keep my medication? Keep out of the reach of children and pets. Store at room temperature between 20 and 25 degrees C (68 and 77 degrees F). Protect from light. Keep container tightly closed. Throw away any unused medication after the expiration date. NOTE: This sheet is a summary. It may not cover all possible information. If you have questions about this medicine, talk to your doctor, pharmacist, or health care provider.  2024 Elsevier/Gold Standard (2021-10-30 00:00:00)    Dofetilide Capsules What is this medication? DOFETILIDE (doe FET il ide) treats a fast or irregular heartbeat (arrhythmia). It works by slowing down overactive electric signals in the heart, which stabilizes your heart rhythm. It belongs to a group of medications called antiarrhythmics. This medicine may be used for other purposes; ask your health care provider or pharmacist if you have questions. COMMON BRAND NAME(S): Tikosyn What should I tell my care team before I take this medication? They need to know if you have any of these conditions: Heart disease History of irregular heartbeat History of low levels of potassium or magnesium in the blood Kidney disease Liver disease An unusual or allergic reaction to dofetilide, other medications, foods, dyes, or preservatives Pregnant or trying to get pregnant Breast-feeding How should I use this medication? Take this medication by mouth with a glass of water. Follow the directions on the prescription label. Do not take with grapefruit juice. You can take it with or  without food. If it upsets your stomach, take it with food. Take your medication at regular intervals. Do not take it more often than directed. Do not stop taking except on your care team's advice. A special MedGuide will be given to you by the pharmacist with each prescription and refill. Be sure to read this information carefully each time. Talk to your care team about the use of this medication in children. Special care may be needed. Overdosage: If you think you have taken too much of this medicine contact a poison control center or emergency room at once. NOTE: This medicine is only for you. Do not share this medicine with others. What if I miss a dose? If you miss a dose, skip it. Take your next dose at the normal time. Do not take extra or 2 doses at the same time to make up for the missed dose. What may interact with this medication? Do not take this medication with any of the following: Cimetidine Cisapride Dolutegravir Dronedarone Erdafitinib Hydrochlorothiazide Ketoconazole Megestrol Pimozide Prochlorperazine Thioridazine Trimethoprim Verapamil This medication may also interact with the following: Amiloride Cannabinoids Certain antibiotics like erythromycin or clarithromycin Certain antiviral medications for HIV or hepatitis Certain medications for depression, anxiety, or psychotic disorders Digoxin Diltiazem Grapefruit juice Metformin Nefazodone Other medications that prolong the QT interval (an abnormal heart rhythm) Quinine Triamterene Zafirlukast Ziprasidone This list may not describe all possible interactions. Give your health care provider a list of all the medicines, herbs, non-prescription drugs, or dietary supplements you use. Also tell them if you smoke, drink alcohol, or use illegal drugs. Some items may interact with  your medicine. What should I watch for while using this medication? Your condition will be monitored carefully while you are receiving this  medication. What side effects may I notice from receiving this medication? Side effects that you should report to your care team as soon as possible: Allergic reactions--skin rash, itching, hives, swelling of the face, lips, tongue, or throat Chest pain Heart rhythm changes--fast or irregular heartbeat, dizziness, feeling faint or lightheaded, chest pain, trouble breathing Side effects that usually do not require medical attention (report to your care team if they continue or are bothersome): Dizziness Headache Nausea Stomach pain Trouble sleeping This list may not describe all possible side effects. Call your doctor for medical advice about side effects. You may report side effects to FDA at 1-800-FDA-1088. Where should I keep my medication? Keep out of the reach of children. Store at room temperature between 15 and 30 degrees C (59 and 86 degrees F). Throw away any unused medication after the expiration date. NOTE: This sheet is a summary. It may not cover all possible information. If you have questions about this medicine, talk to your doctor, pharmacist, or health care provider.  2024 Elsevier/Gold Standard (2021-03-13 00:00:00)

## 2022-12-07 ENCOUNTER — Telehealth: Payer: Self-pay | Admitting: Cardiology

## 2022-12-07 NOTE — Telephone Encounter (Signed)
Patient wants call back from RN Sherri to discuss procedure options.

## 2022-12-07 NOTE — Telephone Encounter (Signed)
Patient is requesting to speak with a nurse. Patient did not state what it was in regards to.

## 2022-12-07 NOTE — Telephone Encounter (Signed)
Called patient who states someone already called him back and answered his questions.

## 2022-12-14 ENCOUNTER — Encounter: Payer: Self-pay | Admitting: Cardiology

## 2022-12-15 NOTE — Telephone Encounter (Signed)
Pt spends a lot of time in the sun -- so made aware Amiodarone is probably not the best option. Pt aware I will send him information on Tikosyn adx so that he may check on cost.  States a friend of his is having an ablation and he is wondering if it is an option for him.... aware forwarding to Dr. Gerre Pebbles to see if he is a candidate.  He understands I will let him know.

## 2022-12-29 ENCOUNTER — Ambulatory Visit (INDEPENDENT_AMBULATORY_CARE_PROVIDER_SITE_OTHER): Payer: Medicare PPO

## 2022-12-29 DIAGNOSIS — I441 Atrioventricular block, second degree: Secondary | ICD-10-CM | POA: Diagnosis not present

## 2022-12-29 LAB — CUP PACEART REMOTE DEVICE CHECK
Battery Remaining Longevity: 149 mo
Battery Voltage: 3.02 V
Brady Statistic RA Percent Paced: 0.5 %
Brady Statistic RV Percent Paced: 99.75 %
Date Time Interrogation Session: 20240904063217
Implantable Lead Connection Status: 753985
Implantable Lead Connection Status: 753985
Implantable Lead Implant Date: 20220824
Implantable Lead Implant Date: 20220824
Implantable Lead Location: 753859
Implantable Lead Location: 753860
Implantable Lead Model: 5076
Implantable Lead Model: 5076
Implantable Pulse Generator Implant Date: 20220824
Lead Channel Impedance Value: 304 Ohm
Lead Channel Impedance Value: 304 Ohm
Lead Channel Impedance Value: 418 Ohm
Lead Channel Impedance Value: 532 Ohm
Lead Channel Pacing Threshold Amplitude: 0.75 V
Lead Channel Pacing Threshold Amplitude: 0.875 V
Lead Channel Pacing Threshold Pulse Width: 0.4 ms
Lead Channel Pacing Threshold Pulse Width: 0.4 ms
Lead Channel Sensing Intrinsic Amplitude: 0.875 mV
Lead Channel Sensing Intrinsic Amplitude: 0.875 mV
Lead Channel Sensing Intrinsic Amplitude: 6.375 mV
Lead Channel Sensing Intrinsic Amplitude: 6.375 mV
Lead Channel Setting Pacing Amplitude: 1.75 V
Lead Channel Setting Pacing Amplitude: 2 V
Lead Channel Setting Pacing Pulse Width: 0.4 ms
Lead Channel Setting Sensing Sensitivity: 0.9 mV
Zone Setting Status: 755011

## 2023-01-03 ENCOUNTER — Other Ambulatory Visit (HOSPITAL_COMMUNITY): Payer: Self-pay | Admitting: *Deleted

## 2023-01-03 MED ORDER — TIMOLOL HEMIHYDRATE 0.25 % OP SOLN: 1.0000 [drp] | Freq: Two times a day (BID) | OPHTHALMIC | Status: DC

## 2023-01-04 ENCOUNTER — Telehealth: Payer: Self-pay | Admitting: Pharmacist

## 2023-01-04 NOTE — Telephone Encounter (Signed)
Medication list reviewed in anticipation of upcoming Tikosyn initiation. Patient is not taking any contraindicated or QTc prolonging medications.   Patient is anticoagulated on Eliquis on the appropriate dose. Please ensure that patient has not missed any anticoagulation doses in the 3 weeks prior to Tikosyn initiation.   Patient will need to be counseled to avoid use of Benadryl while on Tikosyn and in the 2-3 days prior to Tikosyn initiation.  

## 2023-01-11 NOTE — Progress Notes (Signed)
Remote pacemaker transmission.   

## 2023-01-21 ENCOUNTER — Encounter (HOSPITAL_COMMUNITY): Payer: Self-pay

## 2023-01-25 ENCOUNTER — Encounter (HOSPITAL_COMMUNITY): Payer: Self-pay | Admitting: Cardiology

## 2023-01-25 ENCOUNTER — Encounter (HOSPITAL_COMMUNITY): Payer: Self-pay | Admitting: Physician Assistant

## 2023-01-25 ENCOUNTER — Inpatient Hospital Stay (HOSPITAL_COMMUNITY)
Admission: AD | Admit: 2023-01-25 | Discharge: 2023-01-28 | DRG: 309 | Disposition: A | Discharge status: Home / Self Care | Payer: Medicare PPO | Source: Ambulatory Visit | Attending: Cardiology | Admitting: Cardiology

## 2023-01-25 ENCOUNTER — Other Ambulatory Visit (HOSPITAL_COMMUNITY): Payer: Self-pay

## 2023-01-25 ENCOUNTER — Ambulatory Visit (HOSPITAL_BASED_OUTPATIENT_CLINIC_OR_DEPARTMENT_OTHER)
Admission: RE | Admit: 2023-01-25 | Discharge: 2023-01-25 | Disposition: A | Discharge status: Home / Self Care | Payer: Medicare PPO | Source: Ambulatory Visit | Attending: Physician Assistant | Admitting: Physician Assistant

## 2023-01-25 VITALS — BP 116/80 | HR 68 | Ht 72.0 in | Wt 175.4 lb

## 2023-01-25 DIAGNOSIS — Z950 Presence of cardiac pacemaker: Secondary | ICD-10-CM

## 2023-01-25 DIAGNOSIS — Z7901 Long term (current) use of anticoagulants: Secondary | ICD-10-CM

## 2023-01-25 DIAGNOSIS — I441 Atrioventricular block, second degree: Secondary | ICD-10-CM | POA: Diagnosis present

## 2023-01-25 DIAGNOSIS — E785 Hyperlipidemia, unspecified: Secondary | ICD-10-CM | POA: Diagnosis present

## 2023-01-25 DIAGNOSIS — D6869 Other thrombophilia: Secondary | ICD-10-CM | POA: Diagnosis present

## 2023-01-25 DIAGNOSIS — I4819 Other persistent atrial fibrillation: Secondary | ICD-10-CM | POA: Insufficient documentation

## 2023-01-25 DIAGNOSIS — I4891 Unspecified atrial fibrillation: Secondary | ICD-10-CM | POA: Diagnosis not present

## 2023-01-25 DIAGNOSIS — I10 Essential (primary) hypertension: Secondary | ICD-10-CM | POA: Diagnosis not present

## 2023-01-25 DIAGNOSIS — H409 Unspecified glaucoma: Secondary | ICD-10-CM | POA: Diagnosis present

## 2023-01-25 DIAGNOSIS — K219 Gastro-esophageal reflux disease without esophagitis: Secondary | ICD-10-CM | POA: Diagnosis present

## 2023-01-25 DIAGNOSIS — Z8679 Personal history of other diseases of the circulatory system: Secondary | ICD-10-CM | POA: Insufficient documentation

## 2023-01-25 DIAGNOSIS — Z79899 Other long term (current) drug therapy: Secondary | ICD-10-CM | POA: Diagnosis not present

## 2023-01-25 LAB — BASIC METABOLIC PANEL
Anion gap: 8 (ref 5–15)
BUN: 13 mg/dL (ref 8–23)
CO2: 23 mmol/L (ref 22–32)
Calcium: 9 mg/dL (ref 8.9–10.3)
Chloride: 108 mmol/L (ref 98–111)
Creatinine, Ser: 1.14 mg/dL (ref 0.61–1.24)
GFR, Estimated: >60 mL/min (ref >60)
Glucose, Bld: 109 mg/dL — ABNORMAL HIGH (ref 70–99)
Potassium: 3.9 mmol/L (ref 3.5–5.1)
Sodium: 139 mmol/L (ref 135–145)

## 2023-01-25 LAB — MAGNESIUM: Magnesium: 2.1 mg/dL (ref 1.7–2.4)

## 2023-01-25 MED ORDER — POTASSIUM CHLORIDE CRYS ER 20 MEQ PO TBCR
40.0000 meq | EXTENDED_RELEASE_TABLET | ORAL | Status: AC
  Administered 2023-01-25: 40 meq via ORAL
  Filled 2023-01-25: qty 2

## 2023-01-25 MED ORDER — DOFETILIDE 250 MCG PO CAPS
250.0000 ug | ORAL_CAPSULE | Freq: Two times a day (BID) | ORAL | Status: DC
  Administered 2023-01-25 – 2023-01-28 (×6): 250 ug via ORAL
  Filled 2023-01-25 (×6): qty 1

## 2023-01-25 MED ORDER — LATANOPROSTENE BUNOD 0.024 % OP SOLN
1.0000 [drp] | Freq: Every morning | OPHTHALMIC | Status: DC
  Administered 2023-01-26 – 2023-01-28 (×3): 1 [drp] via OPHTHALMIC
  Filled 2023-01-25: qty 1

## 2023-01-25 MED ORDER — SIMVASTATIN 20 MG PO TABS
10.0000 mg | ORAL_TABLET | Freq: Every evening | ORAL | Status: DC
  Administered 2023-01-25 – 2023-01-27 (×3): 10 mg via ORAL
  Filled 2023-01-25 (×3): qty 1

## 2023-01-25 MED ORDER — ACETAMINOPHEN 500 MG PO TABS: 500.0000 mg | ORAL_TABLET | Freq: Four times a day (QID) | ORAL | Status: DC | PRN

## 2023-01-25 MED ORDER — CO Q-10 200 MG PO CAPS: ORAL_CAPSULE | Freq: Every evening | ORAL | Status: DC

## 2023-01-25 MED ORDER — LATANOPROSTENE BUNOD 0.024 % OP SOLN: 1.0000 [drp] | Freq: Every morning | OPHTHALMIC | Status: DC

## 2023-01-25 MED ORDER — TIMOLOL MALEATE 0.25 % OP SOLN
1.0000 [drp] | Freq: Two times a day (BID) | OPHTHALMIC | Status: DC
  Administered 2023-01-25 – 2023-01-27 (×4): 1 [drp] via OPHTHALMIC
  Administered 2023-01-27: 2 [drp] via OPHTHALMIC
  Administered 2023-01-28: 1 [drp] via OPHTHALMIC
  Filled 2023-01-25: qty 5

## 2023-01-25 MED ORDER — PANTOPRAZOLE SODIUM 20 MG PO TBEC
20.0000 mg | DELAYED_RELEASE_TABLET | Freq: Every day | ORAL | Status: DC
  Administered 2023-01-27 – 2023-01-28 (×2): 20 mg via ORAL
  Filled 2023-01-25 (×3): qty 1

## 2023-01-25 MED ORDER — BRIMONIDINE TARTRATE 0.2 % OP SOLN
1.0000 [drp] | Freq: Two times a day (BID) | OPHTHALMIC | Status: DC
  Administered 2023-01-25 – 2023-01-28 (×6): 1 [drp] via OPHTHALMIC
  Filled 2023-01-25: qty 5

## 2023-01-25 MED ORDER — BENAZEPRIL HCL 20 MG PO TABS
20.0000 mg | ORAL_TABLET | Freq: Every day | ORAL | Status: DC
  Administered 2023-01-25 – 2023-01-27 (×3): 20 mg via ORAL
  Filled 2023-01-25 (×4): qty 1

## 2023-01-25 MED ORDER — VITAMIN D 25 MCG (1000 UNIT) PO TABS
1000.0000 [IU] | ORAL_TABLET | Freq: Every day | ORAL | Status: DC
  Administered 2023-01-25 – 2023-01-27 (×3): 1000 [IU] via ORAL
  Filled 2023-01-25 (×3): qty 1

## 2023-01-25 MED ORDER — ALIGN 4 MG PO CAPS: 4.0000 mg | ORAL_CAPSULE | Freq: Every morning | ORAL | Status: DC

## 2023-01-25 MED ORDER — FINASTERIDE 5 MG PO TABS
5.0000 mg | ORAL_TABLET | Freq: Every day | ORAL | Status: DC
  Administered 2023-01-25 – 2023-01-27 (×3): 5 mg via ORAL
  Filled 2023-01-25 (×3): qty 1

## 2023-01-25 MED ORDER — TIMOLOL HEMIHYDRATE 0.25 % OP SOLN: 1.0000 [drp] | Freq: Two times a day (BID) | OPHTHALMIC | Status: DC

## 2023-01-25 MED ORDER — AMLODIPINE BESYLATE 5 MG PO TABS
5.0000 mg | ORAL_TABLET | Freq: Every evening | ORAL | Status: DC
  Administered 2023-01-25 – 2023-01-27 (×3): 5 mg via ORAL
  Filled 2023-01-25 (×3): qty 1

## 2023-01-25 MED ORDER — ADULT MULTIVITAMIN W/MINERALS CH
1.0000 | ORAL_TABLET | Freq: Every evening | ORAL | Status: DC
  Administered 2023-01-25 – 2023-01-27 (×3): 1 via ORAL
  Filled 2023-01-25 (×3): qty 1

## 2023-01-25 MED ORDER — RISAQUAD PO CAPS
1.0000 | ORAL_CAPSULE | Freq: Every day | ORAL | Status: DC
  Administered 2023-01-26 – 2023-01-28 (×3): 1 via ORAL
  Filled 2023-01-25 (×3): qty 1

## 2023-01-25 MED ORDER — APIXABAN 5 MG PO TABS
5.0000 mg | ORAL_TABLET | Freq: Two times a day (BID) | ORAL | Status: DC
  Administered 2023-01-25 – 2023-01-28 (×6): 5 mg via ORAL
  Filled 2023-01-25 (×6): qty 1

## 2023-01-25 MED ORDER — SODIUM CHLORIDE 0.9 % IV SOLN: 250.0000 mL | INTRAVENOUS | Status: DC | PRN

## 2023-01-25 MED ORDER — SODIUM CHLORIDE 0.9% FLUSH: 3.0000 mL | INTRAVENOUS | Status: DC | PRN

## 2023-01-25 MED ORDER — SODIUM CHLORIDE 0.9% FLUSH
3.0000 mL | Freq: Two times a day (BID) | INTRAVENOUS | Status: DC
  Administered 2023-01-25 – 2023-01-28 (×6): 3 mL via INTRAVENOUS

## 2023-01-25 NOTE — TOC CM/SW Note (Signed)
Transition of Care [facility]) - Inpatient Brief Assessment   Patient Details  Name: [name] MRN: [id number] Date of Birth: 09/05/1935  Transition of Care [facility]) CM/SW Contact:    Gala Lewandowsky, RN Phone Number: 01/25/2023, 2:38 PM   Clinical Narrative: Transition of Care Department [facility]) has reviewed the patient. Patient presented for Tikosyn Load. Benefits check completed for cost- $10.00. Case Manager will discuss cost and pharmacy of choice as the patient progresses.   Transition of Care Asessment: Insurance and Status: Insurance coverage has been reviewed Patient has primary care physician: Yes Prior/Current Home Services: No current home services Social Determinants of Health Reivew: SDOH reviewed no interventions necessary Readmission risk has been reviewed: No Transition of care needs: transition of care needs identified, TOC will continue to follow

## 2023-01-25 NOTE — Progress Notes (Signed)
Pharmacy: Dofetilide (Tikosyn) - Initial Consult Assessment and Electrolyte Replacement  Pharmacy consulted to assist in monitoring and replacing electrolytes in this 87 y.o. male admitted on 01/25/2023 undergoing dofetilide initiation. First dofetilide dose: 250 mcg to start 8 PM 10/1  Assessment:  Patient Exclusion Criteria: If any screening criteria checked as "Yes", then  patient  should NOT receive dofetilide until criteria item is corrected.  If "Yes" please indicate correction plan.  YES  NO Patient  Exclusion Criteria Correction Plan   []   [x]   Baseline QTc interval is greater than or equal to 440 msec. IF above YES box checked dofetilide contraindicated unless patient has ICD; then may proceed if QTc 500-550 msec or with known ventricular conduction abnormalities may proceed with QTc 550-600 msec. QTc =  440    []   [x]   Patient is known or suspected to have a digoxin level greater than 2 ng/ml: No results found for: "DIGOXIN"     []   [x]   Creatinine clearance less than 20 ml/min (calculated using Cockcroft-Gault, actual body weight and serum creatinine): Estimated Creatinine Clearance: 52 mL/min (by C-G formula based on SCr of 1.14 mg/dL).     []   [x]  Patient has received drugs known to prolong the QT intervals within the last 48 hours (phenothiazines, tricyclics or tetracyclic antidepressants, erythromycin, H-1 antihistamines, cisapride, fluoroquinolones, azithromycin, ondansetron).   Updated information on QT prolonging agents is available to be searched on the following database:QT prolonging agents     []   [x]   Patient received a dose of hydrochlorothiazide (Oretic) alone or in any combination including triamterene (Dyazide, Maxzide) in the last 48 hours.    []   [x]  Patient received a medication known to increase dofetilide plasma concentrations prior to initial dofetilide dose:  Trimethoprim (Primsol, Proloprim) in the last 36 hours Verapamil (Calan, Verelan) in  the last 36 hours or a sustained release dose in the last 72 hours Megestrol (Megace) in the last 5 days  Cimetidine (Tagamet) in the last 6 hours Ketoconazole (Nizoral) in the last 24 hours Itraconazole (Sporanox) in the last 48 hours  Prochlorperazine (Compazine) in the last 36 hours     []   [x]   Patient is known to have a history of torsades de pointes; congenital or acquired long QT syndromes.    []   [x]   Patient has received a Class 1 antiarrhythmic with less than 2 half-lives since last dose. (Disopyramide, Quinidine, Procainamide, Lidocaine, Mexiletine, Flecainide, Propafenone)    []   [x]   Patient has received amiodarone therapy in the past 3 months or amiodarone level is greater than 0.3 ng/ml.    Labs:    Component Value Date/Time   K 3.9 01/25/2023 1035   MG 2.1 01/25/2023 1035     Plan: Select One Calculated CrCl  Dose q12h  []  > 60 ml/min 500 mcg  [x]  40-60 ml/min 250 mcg  []  20-40 ml/min 125 mcg   [x]   Physician selected initial dose within range recommended for patients level of renal function - will monitor for response.  []   Physician selected initial dose outside of range recommended for patients level of renal function - will discuss if the dose should be altered at this time.   Patient has been appropriately anticoagulated with Eliquis.  Potassium: K 3.8-3.9:  Hold Tikosyn initiation and give KCl 40 mEq po x1 then begin Tikosyn at least 2hr after KCl dose - do not need to recheck K   Magnesium: Mg >2: Appropriate to initiate Tikosyn, no replacement  needed     Thank you for allowing pharmacy to participate in this patient's care   Reece Leader, Colon Flattery, [facility] Clinical Pharmacist  01/25/2023 2:12 PM   [facility] pharmacy phone numbers are listed on [email or web address]

## 2023-01-25 NOTE — TOC Benefit Eligibility Note (Signed)
Patient Product/process development scientist completed.    The patient is insured through [place]. Patient has Medicare and is not eligible for a copay card, but may be able to apply for patient assistance, if available.    Ran test claim for dofetilide (Tikosyn) 250 mcg  and the current 30 day co-pay is $10.00.   This test claim was processed through [facility]- copay amounts may vary at other pharmacies due to pharmacy/plan contracts, or as the patient moves through the different stages of their insurance plan.     Roland Earl, CPHT Pharmacy Technician III Certified Patient Advocate [facility] Patient Advocate Team Direct Number: [phone]  Fax: [phone]

## 2023-01-25 NOTE — Progress Notes (Signed)
Primary Care Physician: Joya Martyr, MD Primary Cardiologist: None Electrophysiologist: Will Jorja Loa, MD  Referring Physician: Dr Elenor Legato Brian Horn is a 87 y.o. male with a history of HTN, HLD, 2nd degree AV block s/p PPM, atrial fibrillation who presents for follow up in the [facility].  The patient was initially on flecainide but this was discontinued due to QRS widening. Patient is on Eliquis for a CHADS2VASC score of 3.  On follow up today, patient presents for dofetilide admission. He denies any missed doses of anticoagulation in the past 3 weeks. He is mostly cardiac unaware but does feel more fatigued in afib.   Today, he denies symptoms of palpitations, chest pain, shortness of breath, orthopnea, PND, lower extremity edema, dizziness, presyncope, syncope, snoring, daytime somnolence, bleeding, or neurologic sequela. The patient is tolerating medications without difficulties and is otherwise without complaint today.    Atrial Fibrillation Risk Factors:  he does not have symptoms or diagnosis of sleep apnea. he does not have a history of rheumatic fever.   Atrial Fibrillation Management history:  Previous antiarrhythmic drugs: flecainide Previous cardioversions: 04/29/17 Previous ablations: none Anticoagulation history: Eliquis  ROS- All systems are reviewed and negative except as per the HPI above.  Past Medical History:  Diagnosis Date   Blood dyscrasia    hemachromatosis, takes tx q year   ED (erectile dysfunction)    GERD (gastroesophageal reflux disease)    Glaucoma    right eye   Hematuria, gross    History of kidney stones    Hyperlipidemia    Hypertension    Inguinal hernia    Paroxysmal atrial fibrillation (HCC)    Peyronie's disease    Pulmonic stenosis    mild   Tinnitus     Current Outpatient Medications  Medication Sig Dispense Refill   acetaminophen (TYLENOL) 500 MG tablet Take 500-1,000 mg  by mouth every 6 (six) hours as needed (for pain.).     amLODipine (NORVASC) 5 MG tablet Take 5 mg by mouth every evening.     apixaban (ELIQUIS) 5 MG TABS tablet Take 1 tablet (5 mg total) by mouth 2 (two) times daily. 180 tablet 1   benazepril (LOTENSIN) 20 MG tablet TAKE 1 TABLET BY MOUTH EVERYDAY AT BEDTIME 90 tablet 1   brimonidine (ALPHAGAN) 0.2 % ophthalmic solution Place 1 drop into both eyes 2 (two) times daily.     Cholecalciferol (VITAMIN D3) 1000 units CAPS Take 1,000 Units by mouth at bedtime.      Cinnamon 500 MG TABS Take 500 mg by mouth at bedtime.      Coenzyme Q10 (CO Q-10 PO) Take 1 capsule by mouth every evening.     cyanocobalamin (VITAMIN B12) 1000 MCG/ML injection Inject 1,000 mcg into the muscle every 30 (thirty) days.     finasteride (PROSCAR) 5 MG tablet Take 5 mg by mouth at bedtime.     lansoprazole (PREVACID) 15 MG capsule Take 15 mg by mouth daily before breakfast.     Multiple Vitamin (MULTIVITAMIN WITH MINERALS) TABS tablet Take 1 tablet by mouth every evening.     Probiotic Product (ALIGN) 4 MG CAPS Take 4 mg by mouth in the morning.     simvastatin (ZOCOR) 20 MG tablet Take 10 mg by mouth every evening.     timolol (BETIMOL) 0.25 % ophthalmic solution Place 1-2 drops into both eyes 2 (two) times daily.     VYZULTA 0.024 %  SOLN Place 1 drop into both eyes in the morning.     No current facility-administered medications for this encounter.    Physical Exam: BP 116/80   Pulse 68   Ht 6' (1.829 m)   Wt 79.6 kg   BMI 23.79 kg/m   GEN: Well nourished, well developed in no acute distress NECK: No JVD; No carotid bruits CARDIAC: Regular rate and rhythm, no murmurs, rubs, gallops RESPIRATORY:  Clear to auscultation without rales, wheezing or rhonchi  ABDOMEN: Soft, non-tender, non-distended EXTREMITIES:  No edema; No deformity   Wt Readings from Last 3 Encounters:  01/25/23 79.6 kg  11/30/22 77.8 kg  06/21/22 79.8 kg     EKG today demonstrates  V  paced rhythm with underlying afib Vent. rate 68 BPM PR interval * ms QRS duration 120 ms QT/QTcB 414/440 ms  Echo 10/29/20 demonstrated   1. Mild to moderate pulmonic stenosis is present. Vmax 3.3 m/s, MG 17  mmHG. Similar to prior echo. Appears at the level of the valve. Normal RV  size/function. Given the patient's age, would expect a benign course for  his pulmonic stenosis. Mild to moderate valvular pulmonic stenosis.   2. Left ventricular ejection fraction, by estimation, is 65 to 70%. Left  ventricular ejection fraction by 3D volume is 71 %. The left ventricle has  normal function. The left ventricle has no regional wall motion  abnormalities. Indeterminate diastolic  filling due to E-A fusion. The average left ventricular global  longitudinal strain is -26.6 %. The global longitudinal strain is normal.   3. Right ventricular systolic function is normal. The right ventricular  size is normal. Tricuspid regurgitation signal is inadequate for assessing  PA pressure.   4. The mitral valve is normal in structure. Trivial mitral valve  regurgitation. No evidence of mitral stenosis.   5. The aortic valve is tricuspid. Aortic valve regurgitation is not  visualized. No aortic stenosis is present.   6. The inferior vena cava is normal in size with greater than 50%  respiratory variability, suggesting right atrial pressure of 3 mmHg.   7. 2:1 AV block noted during the study.   Comparison(s): Changes from prior study are noted. EF remains unchanged.  Mild to moderate PS is present. 2:1 AV block present.    CHA2DS2-VASc Score = 3  The patient's score is based upon: CHF History: 0 HTN History: 1 Diabetes History: 0 Stroke History: 0 Vascular Disease History: 0 Age Score: 2 Gender Score: 0       ASSESSMENT AND PLAN: Persistent Atrial Fibrillation (ICD10:  I48.19) The patient's CHA2DS2-VASc score is 3, indicating a 3.2% annual risk of stroke.   Patient presents for dofetilide  admission. Continue Eliquis, states no missed doses in the last 3 weeks. No recent benadryl use PharmD has screened medications Labs today show creatinine at 1.14, K+ 3.9 and mag 2.1, CrCl calculated at 53 mL/min  Secondary Hypercoagulable State (ICD10:  D68.69) The patient is at significant risk for stroke/thromboembolism based upon his CHA2DS2-VASc Score of 3.  Continue Apixaban (Eliquis).   HTN Stable on current regimen  2nd degree AV block S/p PPM, followed by Dr Elberta Fortis and the device clinic    To be admitted later today once a bed becomes available.       Jorja Loa PA-C [facility] [address] [place], [place] [address] [phone]

## 2023-01-25 NOTE — H&P (Addendum)
Electrophysiology H&P  Note    Primary Care Physician: Joya Martyr, MD Primary Cardiologist: None Electrophysiologist: Melquisedec Journey Jorja Loa, MD  Referring Physician: Dr Elenor Legato Brian Horn is a 87 y.o. male with a history of HTN, HLD, 2nd degree AV block s/p PPM, atrial fibrillation who presents for follow up in the [facility].  The patient was initially on flecainide but this was discontinued due to QRS widening. Patient is on Eliquis for a CHADS2VASC score of 3.  On follow up today, patient presents for dofetilide admission. He denies any missed doses of anticoagulation in the past 3 weeks. He is mostly cardiac unaware but does feel more fatigued in afib.   Today, he denies symptoms of palpitations, chest pain, shortness of breath, orthopnea, PND, lower extremity edema, dizziness, presyncope, syncope, snoring, daytime somnolence, bleeding, or neurologic sequela. The patient is tolerating medications without difficulties and is otherwise without complaint today.    Atrial Fibrillation Risk Factors:  he does not have symptoms or diagnosis of sleep apnea. he does not have a history of rheumatic fever.   Atrial Fibrillation Management history:  Previous antiarrhythmic drugs: flecainide Previous cardioversions: 04/29/17 Previous ablations: none Anticoagulation history: Eliquis  ROS- All systems are reviewed and negative except as per the HPI above.  Past Medical History:  Diagnosis Date   Blood dyscrasia    hemachromatosis, takes tx q year   ED (erectile dysfunction)    GERD (gastroesophageal reflux disease)    Glaucoma    right eye   Hematuria, gross    History of kidney stones    Hyperlipidemia    Hypertension    Inguinal hernia    Paroxysmal atrial fibrillation (HCC)    Peyronie's disease    Pulmonic stenosis    mild   Tinnitus     Current Facility-Administered Medications  Medication Dose Route Frequency Provider Last  Rate Last Admin   0.9 %  sodium chloride infusion  250 mL Intravenous PRN Theophile Harvie Daphine Deutscher, MD       acetaminophen (TYLENOL) tablet 500-1,000 mg  500-1,000 mg Oral Q6H PRN Fenton, Clint R, PA       acidophilus (RISAQUAD) capsule 1 capsule  1 capsule Oral Daily Petrice Beedy Daphine Deutscher, MD       amLODipine (NORVASC) tablet 5 mg  5 mg Oral QPM Fenton, Clint R, PA       apixaban (ELIQUIS) tablet 5 mg  5 mg Oral BID Mahonri Seiden Daphine Deutscher, MD       benazepril (LOTENSIN) tablet 20 mg  20 mg Oral QHS Fenton, Clint R, PA       brimonidine (ALPHAGAN) 0.2 % ophthalmic solution 1 drop  1 drop Both Eyes BID Fenton, Clint R, PA       cholecalciferol (VITAMIN D3) 25 MCG (1000 UNIT) tablet 1,000 Units  1,000 Units Oral QHS Fenton, Clint R, PA       dofetilide (TIKOSYN) capsule 250 mcg  250 mcg Oral BID Simon Llamas Daphine Deutscher, MD       finasteride (PROSCAR) tablet 5 mg  5 mg Oral QHS Fenton, Clint R, PA       [START ON 01/26/2023] Latanoprostene Bunod 0.024 % SOLN 1 drop  1 drop Both Eyes q AM Shalea Tomczak Daphine Deutscher, MD       multivitamin with minerals tablet 1 tablet  1 tablet Oral QPM Fenton, Clint R, PA       [START ON 01/27/2023] pantoprazole (PROTONIX) EC tablet 20  mg  20 mg Oral QAC breakfast Fenton, Clint R, PA       potassium chloride SA (KLOR-CON M) CR tablet 40 mEq  40 mEq Oral STAT Carney, Gwenlyn Found, RPH       simvastatin (ZOCOR) tablet 10 mg  10 mg Oral QPM Fenton, Clint R, PA       sodium chloride flush (NS) 0.9 % injection 3 mL  3 mL Intravenous Q12H Arvin Abello Daphine Deutscher, MD       sodium chloride flush (NS) 0.9 % injection 3 mL  3 mL Intravenous PRN Darryon Bastin Daphine Deutscher, MD       timolol (TIMOPTIC) 0.25 % ophthalmic solution 1-2 drop  1-2 drop Both Eyes BID Regan Lemming, MD        Physical Exam: BP 116/80  Pulse 68  Ht 6' (1.829 m)  Wt 79.6 kg  BMI 23.79 kg/m   GEN: Well nourished, well developed in no acute distress NECK: No JVD; No carotid bruits CARDIAC: Regular rate and rhythm, no  murmurs, rubs, gallops RESPIRATORY:  Clear to auscultation without rales, wheezing or rhonchi  ABDOMEN: Soft, non-tender, non-distended EXTREMITIES:  No edema; No deformity   Wt Readings from Last 3 Encounters:  01/25/23 79.6 kg  11/30/22 77.8 kg  06/21/22 79.8 kg     EKG today demonstrates  V paced rhythm with underlying afib Vent. rate 68 BPM PR interval * ms QRS duration 120 ms QT/QTcB 414/440 ms  Echo 10/29/20 demonstrated   1. Mild to moderate pulmonic stenosis is present. Vmax 3.3 m/s, MG 17  mmHG. Similar to prior echo. Appears at the level of the valve. Normal RV  size/function. Given the patient's age, would expect a benign course for  his pulmonic stenosis. Mild to moderate valvular pulmonic stenosis.   2. Left ventricular ejection fraction, by estimation, is 65 to 70%. Left  ventricular ejection fraction by 3D volume is 71 %. The left ventricle has  normal function. The left ventricle has no regional wall motion  abnormalities. Indeterminate diastolic  filling due to E-A fusion. The average left ventricular global  longitudinal strain is -26.6 %. The global longitudinal strain is normal.   3. Right ventricular systolic function is normal. The right ventricular  size is normal. Tricuspid regurgitation signal is inadequate for assessing  PA pressure.   4. The mitral valve is normal in structure. Trivial mitral valve  regurgitation. No evidence of mitral stenosis.   5. The aortic valve is tricuspid. Aortic valve regurgitation is not  visualized. No aortic stenosis is present.   6. The inferior vena cava is normal in size with greater than 50%  respiratory variability, suggesting right atrial pressure of 3 mmHg.   7. 2:1 AV block noted during the study.   Comparison(s): Changes from prior study are noted. EF remains unchanged.  Mild to moderate PS is present. 2:1 AV block present.    CHA2DS2-VASc Score = 3  The patient's score is based upon: CHF History: 0 HTN  History: 1 Diabetes History: 0 Stroke History: 0 Vascular Disease History: 0 Age Score: 2 Gender Score: 0       ASSESSMENT AND PLAN: Persistent Atrial Fibrillation (ICD10:  I48.19) The patient's CHA2DS2-VASc score is 3, indicating a 3.2% annual risk of stroke.   Patient presents for dofetilide admission. Continue Eliquis, states no missed doses in the last 3 weeks. No recent benadryl use PharmD has screened medications Labs today show creatinine at 1.14, K+ 3.9 and mag 2.1, CrCl  calculated at 53 mL/min  Secondary Hypercoagulable State (ICD10:  D68.69) The patient is at significant risk for stroke/thromboembolism based upon his CHA2DS2-VASc Score of 3.  Continue Apixaban (Eliquis).   HTN Stable on current regimen  2nd degree AV block S/p PPM, followed by Dr Elberta Fortis and the device clinic  Patient presents for tikosyn as above  Casimiro Needle "Mardelle Matte" [place], PA-C  01/25/2023 3:21 PM   I have seen and examined this patient with Otilio Saber.  Agree with above, note added to reflect my findings.  Patient admitted to the hospital for dofetilide load.  Currently feeling well, though with mild fatigue and shortness of breath due to his atrial fibrillation.  No other acute complaints.  GEN: Well nourished, well developed, in no acute distress  HEENT: normal  Neck: no JVD, carotid bruits, or masses Cardiac: RRR; no murmurs, rubs, or gallops,no edema  Respiratory:  clear to auscultation bilaterally, normal work of breathing GI: soft, nontender, nondistended, + BS MS: no deformity or atrophy  Skin: warm and dry, device site well healed Neuro:  Strength and sensation are intact Psych: euthymic mood, full affect   Persistent atrial fibrillation: Patient feels poorly in atrial fibrillation.  Admission to the hospital for dofetilide load.  EKG after each dose.  Mckinzey Entwistle check potassium magnesium daily.  Cardioversion after the fourth dose. Hypertension: Continue current regiment Second-degree AV  block: Post pacemaker  Zendaya Groseclose M. Jacen Carlini MD 01/25/2023 4:32 PM

## 2023-01-26 ENCOUNTER — Other Ambulatory Visit: Payer: Self-pay

## 2023-01-26 DIAGNOSIS — I4819 Other persistent atrial fibrillation: Secondary | ICD-10-CM | POA: Diagnosis not present

## 2023-01-26 LAB — BASIC METABOLIC PANEL
Anion gap: 9 (ref 5–15)
BUN: 12 mg/dL (ref 8–23)
CO2: 20 mmol/L — ABNORMAL LOW (ref 22–32)
Calcium: 9 mg/dL (ref 8.9–10.3)
Chloride: 109 mmol/L (ref 98–111)
Creatinine, Ser: 1.05 mg/dL (ref 0.61–1.24)
GFR, Estimated: >60 mL/min (ref >60)
Glucose, Bld: 123 mg/dL — ABNORMAL HIGH (ref 70–99)
Potassium: 3.8 mmol/L (ref 3.5–5.1)
Sodium: 138 mmol/L (ref 135–145)

## 2023-01-26 LAB — MAGNESIUM: Magnesium: 2 mg/dL (ref 1.7–2.4)

## 2023-01-26 MED ORDER — MAGNESIUM SULFATE 2 GM/50ML IV SOLN
2.0000 g | Freq: Once | INTRAVENOUS | Status: AC
  Administered 2023-01-26: 2 g via INTRAVENOUS
  Filled 2023-01-26: qty 50

## 2023-01-26 MED ORDER — POTASSIUM CHLORIDE CRYS ER 20 MEQ PO TBCR
40.0000 meq | EXTENDED_RELEASE_TABLET | Freq: Once | ORAL | Status: DC
  Filled 2023-01-26: qty 2

## 2023-01-26 MED ORDER — POTASSIUM CHLORIDE CRYS ER 20 MEQ PO TBCR
40.0000 meq | EXTENDED_RELEASE_TABLET | Freq: Once | ORAL | Status: AC
  Administered 2023-01-26: 40 meq via ORAL
  Filled 2023-01-26: qty 2

## 2023-01-26 MED ORDER — SODIUM CHLORIDE 0.9 % IV SOLN: INTRAVENOUS | Status: DC

## 2023-01-26 NOTE — Progress Notes (Signed)
Pharmacy: Dofetilide (Tikosyn) - Follow Up Assessment and Electrolyte Replacement  Pharmacy consulted to assist in monitoring and replacing electrolytes in this 87 y.o. male admitted on 01/25/2023 undergoing dofetilide initiation. First dofetilide dose: 10/1 PM  Labs:    Component Value Date/Time   K 3.8 01/26/2023 0424   MG 2.0 01/26/2023 0424     Plan: Potassium: K 3.8-3.9:  Give KCl 40 mEq po x1   Magnesium: Mg 1.8-2: Give Mg 2 gm IV x1    Thank you for allowing pharmacy to participate in this patient's care   Carron Brazen 01/26/2023  6:47 AM

## 2023-01-26 NOTE — Progress Notes (Signed)
Morning EKG reviewed     Shows pt remains in afib at 62 bpm with stable QTc at 454 ms.  Continue  Tikosyn 250 mcg BID.   Potassium3.8 (10/02 0424) Magnesium  2.0 (10/02 0424) Creatinine, ser  1.05 (10/02 0424)  Pt will be NPO after midnight for DCCV if remains in afib    Canary Brim, MSN, APRN, NP-C, AGACNP-BC [facility] - Electrophysiology  01/26/2023, 11:55 AM

## 2023-01-26 NOTE — Progress Notes (Cosign Needed Addendum)
Electrophysiology Rounding Note  Patient Name: Brian Horn Date of Encounter: 01/26/2023  Primary Cardiologist: None  Electrophysiologist: Will Jorja Loa, MD    Subjective   Pt remains in afib on Tikosyn 250 mcg BID   QTc from EKG last pm shows stable QTc at corrected for QRS  The patient is doing well today.  At this time, the patient denies chest pain, shortness of breath, or any new concerns.  Inpatient Medications    Scheduled Meds:  acidophilus  1 capsule Oral Daily   amLODipine  5 mg Oral QPM   apixaban  5 mg Oral BID   benazepril  20 mg Oral QHS   brimonidine  1 drop Both Eyes BID   cholecalciferol  1,000 Units Oral QHS   dofetilide  250 mcg Oral BID   finasteride  5 mg Oral QHS   Latanoprostene Bunod  1 drop Both Eyes q AM   multivitamin with minerals  1 tablet Oral QPM   [START ON 01/27/2023] pantoprazole  20 mg Oral QAC breakfast   potassium chloride  40 mEq Oral Once   simvastatin  10 mg Oral QPM   sodium chloride flush  3 mL Intravenous Q12H   timolol  1-2 drop Both Eyes BID   Continuous Infusions:  sodium chloride     PRN Meds: sodium chloride, acetaminophen, sodium chloride flush   Vital Signs    Vitals:   01/25/23 1439 01/25/23 2023 01/26/23 0352 01/26/23 0806  BP:  137/85 119/89 (!) 134/90  Pulse:  64 82 64  Resp:    16  Temp: 97.7 F (36.5 C) 97.8 F (36.6 C) 97.9 F (36.6 C) 98.8 F (37.1 C)  TempSrc: Oral Oral Oral Oral  SpO2:  100% 98% 98%   No intake or output data in the 24 hours ending 01/26/23 0951 There were no vitals filed for this visit.  Physical Exam    Exam per Dr. Elberta Fortis   GEN- NAD, A&O x 3. Normal affect.  Lungs- CTAB, Normal effort.  Heart- Regular rate and rhythm (VP). No M/G/R GI- Soft, NT, ND Extremities- No clubbing, cyanosis, or edema Skin- no rash or lesion  Labs    CBC No results for input(s): "WBC", "NEUTROABS", "HGB", "HCT", "MCV", "PLT" in the last 72 hours. Basic Metabolic  Panel Recent Labs    01/25/23 1035 01/26/23 0424  NA 139 138  K 3.9 3.8  CL 108 109  CO2 23 20*  GLUCOSE 109* 123*  BUN 13 12  CREATININE 1.14 1.05  CALCIUM 9.0 9.0  MG 2.1 2.0    Telemetry    VP 60 (AF) (personally reviewed)  Patient Profile     Brian Horn is a 87 y.o. male with a past medical history significant for persistent atrial fibrillation.  They were admitted for tikosyn load.   Assessment & Plan    Persistent Atrial Fibrillation Pt remains in afib on Tikosyn 250 mcg BID  Continue Eliquis Creatinine, ser  1.05 (10/02 0424) Magnesium  2.0 (10/02 0424) Potassium3.8 (10/02 0424) Supplement both K and Mg per protocol  If pt does not convert chemically, plan on DCCV tomorrow   Informed Consent   Shared Decision Making/Informed Consent The risks (stroke, cardiac arrhythmias rarely resulting in the need for a temporary or permanent pacemaker, skin irritation or burns and complications associated with conscious sedation including aspiration, arrhythmia, respiratory failure and death), benefits (restoration of normal sinus rhythm) and alternatives of a direct current cardioversion were  explained in detail to Brian Horn and he agrees to proceed.        For questions or updates, please contact [facility] Please consult [email or web address] for contact info under Cardiology/STEMI.  Signed, Canary Brim, MSN, APRN, NP-C, AGACNP-BC [facility] - Electrophysiology  01/26/2023, 9:55 AM  I have seen and examined this patient with Canary Brim.  Agree with above, note added to reflect my findings.  Feeling well without complaint.  GEN: Well nourished, well developed, in no acute distress  HEENT: normal  Neck: no JVD, carotid bruits, or masses Cardiac: RRR; no murmurs, rubs, or gallops,no edema  Respiratory:  clear to auscultation bilaterally, normal work of breathing GI: soft, nontender, nondistended, + BS MS: no deformity or atrophy  Skin: warm and  dry, device site well healed Neuro:  Strength and sensation are intact Psych: euthymic mood, full affect   Persistent atrial fibrillation: Dofetilide load in progress.  QTc remained stable.  Will plan for cardioversion tomorrow.  Labs without abnormality.  Will M. Camnitz MD 01/27/2023 9:51 AM

## 2023-01-27 ENCOUNTER — Inpatient Hospital Stay (HOSPITAL_COMMUNITY): Payer: Medicare PPO | Admitting: Certified Registered Nurse Anesthetist

## 2023-01-27 ENCOUNTER — Encounter (HOSPITAL_COMMUNITY): Payer: Self-pay | Admitting: Cardiology

## 2023-01-27 ENCOUNTER — Encounter (HOSPITAL_COMMUNITY)
Admission: AD | Disposition: A | Discharge status: Home / Self Care | Payer: Self-pay | Source: Ambulatory Visit | Attending: Cardiology

## 2023-01-27 DIAGNOSIS — I4891 Unspecified atrial fibrillation: Secondary | ICD-10-CM | POA: Diagnosis not present

## 2023-01-27 DIAGNOSIS — I4819 Other persistent atrial fibrillation: Secondary | ICD-10-CM | POA: Diagnosis not present

## 2023-01-27 HISTORY — PX: CARDIOVERSION: SHX1299

## 2023-01-27 LAB — BASIC METABOLIC PANEL
Anion gap: 7 (ref 5–15)
BUN: 16 mg/dL (ref 8–23)
CO2: 25 mmol/L (ref 22–32)
Calcium: 9.1 mg/dL (ref 8.9–10.3)
Chloride: 108 mmol/L (ref 98–111)
Creatinine, Ser: 1.09 mg/dL (ref 0.61–1.24)
GFR, Estimated: >60 mL/min (ref >60)
Glucose, Bld: 111 mg/dL — ABNORMAL HIGH (ref 70–99)
Potassium: 4.3 mmol/L (ref 3.5–5.1)
Sodium: 140 mmol/L (ref 135–145)

## 2023-01-27 LAB — MAGNESIUM: Magnesium: 2.3 mg/dL (ref 1.7–2.4)

## 2023-01-27 SURGERY — CARDIOVERSION
Anesthesia: General

## 2023-01-27 MED ORDER — LIDOCAINE 2% (20 MG/ML) 5 ML SYRINGE
INTRAMUSCULAR | Status: DC | PRN
  Administered 2023-01-27: 20 mg via INTRAVENOUS

## 2023-01-27 MED ORDER — PROPOFOL 10 MG/ML IV BOLUS
INTRAVENOUS | Status: DC | PRN
  Administered 2023-01-27: 20 mg via INTRAVENOUS
  Administered 2023-01-27: 60 mg via INTRAVENOUS

## 2023-01-27 SURGICAL SUPPLY — 1 items: PAD DEFIB RADIO PHYSIO CONN (PAD) ×1 IMPLANT

## 2023-01-27 NOTE — Progress Notes (Addendum)
Electrophysiology Rounding Note  Patient Name: Brian Horn Date of Encounter: 01/27/2023  Primary Cardiologist: None  Electrophysiologist: Dakota Stangl Jorja Loa, MD    Subjective   Pt remains in afib on Tikosyn 250 mcg BID   QTc from EKG last pm shows stable QTc at  The patient is doing well today.  At this time, the patient denies chest pain, shortness of breath, or any new concerns.  Inpatient Medications    Scheduled Meds:  acidophilus  1 capsule Oral Daily   amLODipine  5 mg Oral QPM   apixaban  5 mg Oral BID   benazepril  20 mg Oral QHS   brimonidine  1 drop Both Eyes BID   cholecalciferol  1,000 Units Oral QHS   dofetilide  250 mcg Oral BID   finasteride  5 mg Oral QHS   Latanoprostene Bunod  1 drop Both Eyes q AM   multivitamin with minerals  1 tablet Oral QPM   pantoprazole  20 mg Oral QAC breakfast   simvastatin  10 mg Oral QPM   sodium chloride flush  3 mL Intravenous Q12H   timolol  1-2 drop Both Eyes BID   Continuous Infusions:  sodium chloride     sodium chloride     PRN Meds: sodium chloride, acetaminophen, sodium chloride flush   Vital Signs    Vitals:   01/26/23 1214 01/26/23 1832 01/26/23 2011 01/27/23 0329  BP: 123/80 136/74 (!) 143/86 123/84  Pulse: 65   69  Resp: 16  18   Temp: 97.6 F (36.4 C)  97.6 F (36.4 C) 97.8 F (36.6 C)  TempSrc: Oral  Oral Oral  SpO2: 97%  96% 98%    Intake/Output Summary (Last 24 hours) at 01/27/2023 0841 Last data filed at 01/26/2023 1500 Gross per 24 hour  Intake 0 ml  Output --  Net 0 ml   There were no vitals filed for this visit.  Physical Exam    GEN- NAD, A&O x 3. Normal affect.  Lungs- CTAB, Normal effort.  Heart- Regular rate and rhythm. No M/G/R GI- Soft, NT, ND Extremities- No clubbing, cyanosis, or edema Skin- no rash or lesion  Labs    CBC No results for input(s): "WBC", "NEUTROABS", "HGB", "HCT", "MCV", "PLT" in the last 72 hours. Basic Metabolic Panel Recent Labs     01/26/23 0424 01/27/23 0405  NA 138 140  K 3.8 4.3  CL 109 108  CO2 20* 25  GLUCOSE 123* 111*  BUN 12 16  CREATININE 1.05 1.09  CALCIUM 9.0 9.1  MG 2.0 2.3    Telemetry    VP, underlying atrial fibrillation 60's (personally reviewed)  Patient Profile     Brian Horn is a 87 y.o. male with a past medical history significant for persistent atrial fibrillation.  They were admitted for tikosyn load.   Assessment & Plan    Persistent Atrial Fibrillation Pt remains in afib on Tikosyn 250 mcg BID  Continue Eliquis Creatinine, ser  1.09 (10/03 0405) Magnesium  2.3 (10/03 0405) Potassium4.3 (10/03 0405) No electrolyte supplementation needed  If pt does not convert chemically, plan on DCCV today    For questions or updates, please contact [facility] Please consult [email or web address] for contact info under Cardiology/STEMI.  Signed, Canary Brim, MSN, APRN, NP-C, AGACNP-BC [facility] - Electrophysiology  01/27/2023, 8:41 AM  I have seen and examined this patient with Canary Brim.  Agree with above, note added to reflect my findings.  Feeling well without complaint.  Cardioversion planned for today.  GEN: Well nourished, well developed, in no acute distress  HEENT: normal  Neck: no JVD, carotid bruits, or masses Cardiac: RRR; no murmurs, rubs, or gallops,no edema  Respiratory:  clear to auscultation bilaterally, normal work of breathing GI: soft, nontender, nondistended, + BS MS: no deformity or atrophy  Skin: warm and dry, device site well healed Neuro:  Strength and sensation are intact Psych: euthymic mood, full affect   Persistent atrial fibrillation: QTc remained stable.  Currently on dofetilide.  Plan for cardioversion today.  Likely discharge home tomorrow.  Recent labs within normal limits.  Dishon Kehoe M. Leilany Digeronimo MD 01/27/2023 9:52 AM

## 2023-01-27 NOTE — Anesthesia Preprocedure Evaluation (Signed)
Anesthesia Evaluation  Patient identified by MRN, date of birth, ID band Patient awake    Reviewed: Allergy & Precautions, NPO status , Patient's Chart, lab work & pertinent test results  Airway Mallampati: II  TM Distance: >3 FB Neck ROM: Full    Dental no notable dental hx.    Pulmonary neg pulmonary ROS, former smoker   Pulmonary exam normal        Cardiovascular hypertension, + dysrhythmias Atrial Fibrillation  Rhythm:Irregular Rate:Normal     Neuro/Psych negative neurological ROS  negative psych ROS   GI/Hepatic Neg liver ROS,GERD  Medicated,,  Endo/Other    Renal/GU negative Renal ROS  negative genitourinary   Musculoskeletal negative musculoskeletal ROS (+)    Abdominal Normal abdominal exam  (+)   Peds  Hematology Lab Results      Component                Value               Date                      WBC                      7.8                 11/30/2022                HGB                      14.4                11/30/2022                HCT                      43.3                11/30/2022                MCV                      99 (H)              11/30/2022                PLT                      160                 11/30/2022             Lab Results      Component                Value               Date                      NA                       140                 01/27/2023                K  4.3                 01/27/2023                CO2                      25                  01/27/2023                GLUCOSE                  111 (H)             01/27/2023                BUN                      16                  01/27/2023                CREATININE               1.09                01/27/2023                CALCIUM                  9.1                 01/27/2023                EGFR                     64                  11/30/2022                GFRNONAA                  >60                 01/27/2023              Anesthesia Other Findings   Reproductive/Obstetrics                             Anesthesia Physical Anesthesia Plan  ASA: 3  Anesthesia Plan: General   Post-op Pain Management:    Induction: Intravenous  PONV Risk Score and Plan: 2 and Treatment may vary due to age or medical condition  Airway Management Planned: Nasal Cannula and Simple Face Mask  Additional Equipment: None  Intra-op Plan:   Post-operative Plan:   Informed Consent: I have reviewed the patients History and Physical, chart, labs and discussed the procedure including the risks, benefits and alternatives for the proposed anesthesia with the patient or authorized representative who has indicated his/her understanding and acceptance.     Dental advisory given  Plan Discussed with: CRNA  Anesthesia Plan Comments:        Anesthesia Quick Evaluation

## 2023-01-27 NOTE — Care Management (Signed)
01-27-23 1436 Patient presented for Tikosyn Load. Case Manager spoke with the patient regarding co pay cost. Patient is agreeable to cost and would like to have the initial Rx filled via [facility] and the Rx refills 90 day supply escribed to [facility] [place]. No further needs identified at this time.

## 2023-01-27 NOTE — Interval H&P Note (Signed)
History and Physical Interval Note:  01/27/2023 10:18 AM  Brian Horn  has presented today for surgery, with the diagnosis of afib.  The various methods of treatment have been discussed with the patient and family. After consideration of risks, benefits and other options for treatment, the patient has consented to  Procedure(s): CARDIOVERSION (N/A) as a surgical intervention.  The patient's history has been reviewed, patient examined, no change in status, stable for surgery.  I have reviewed the patient's chart and labs.  Questions were answered to the patient's satisfaction.     Olga Millers

## 2023-01-27 NOTE — Transfer of Care (Signed)
Immediate Anesthesia Transfer of Care Note  Patient: Brian Horn  Procedure(s) Performed: CARDIOVERSION  Patient Location: Cath Lab  Anesthesia Type:MAC  Level of Consciousness: drowsy and patient cooperative  Airway & Oxygen Therapy: Patient Spontanous Breathing and Patient connected to nasal cannula oxygen  Post-op Assessment: Report given to RN and Post -op Vital signs reviewed and stable  Post vital signs: Reviewed and stable  Last Vitals:  Vitals Value Taken Time  BP 119/80   Temp    Pulse 60 01/27/23 1116  Resp 17 01/27/23 1116  SpO2 99 % 01/27/23 1116  Vitals shown include unfiled device data.  Last Pain:  Vitals:   01/27/23 1000  TempSrc: Temporal  PainSc:       Patients Stated Pain Goal: 0 (01/27/23 0800)  Complications: No notable events documented.

## 2023-01-27 NOTE — Procedures (Signed)
Electrical Cardioversion Procedure Note GREIG ALTERGOTT [id number] 14-Feb-1936  Procedure: Electrical Cardioversion Indications:  Atrial Fibrillation  Procedure Details Consent: Risks of procedure as well as the alternatives and risks of each were explained to the (patient/caregiver).  Consent for procedure obtained. Time Out: Verified patient identification, verified procedure, site/side was marked, verified correct patient position, special equipment/implants available, medications/allergies/relevent history reviewed, required imaging and test results available.  Performed  Patient placed on cardiac monitor, pulse oximetry, supplemental oxygen as necessary.  Sedation given:  Pt sedated by anesthesia with lidocaine 20 mg and diprovan 80 mg IV. Pacer pads placed anterior and posterior chest.  Cardioverted 1 time(s).  Cardioverted at 200J.  Evaluation Findings: Post procedure EKG shows: NSR Complications: None Patient did tolerate procedure well.   Brian Horn 01/27/2023, 10:17 AM

## 2023-01-27 NOTE — Progress Notes (Signed)
Morning EKG reviewed     Shows has converted to NSR post DCCV at 80 bpm with stable QTc at 472 ms.  Continue  Tikosyn 250 mcg BID.   Potassium4.3 (10/03 0405) Magnesium  2.3 (10/03 0405) Creatinine, ser  1.09 (10/03 0405)  Plan for home Friday if QTc remains stable      Canary Brim, MSN, APRN, NP-C, AGACNP-BC [facility] - Electrophysiology  01/27/2023, 1:04 PM

## 2023-01-27 NOTE — Discharge Summary (Addendum)
ELECTROPHYSIOLOGY DISCHARGE SUMMARY    Patient ID: Brian Horn,  MRN: [id number], DOB/AGE: January 25, 1936 87 y.o.  Admit date: 01/25/2023 Discharge date: 01/28/2023    Primary Care Physician: Joya Martyr, MD  Primary Cardiologist: None  Electrophysiologist: Dr. Elberta Fortis   Primary Discharge Diagnosis:  1.  Persistent atrial fibrillation status post Tikosyn loading this admission   Allergies  Allergen Reactions   Lipitor [Atorvastatin Calcium] Other (See Comments)    Myalgias     Procedures This Admission:  1.  Tikosyn loading  2.  Direct current cardioversion on Thursday January 27, 2023 by Dr. Jens Som which successfully restored SR.  There were no early apparent complications.   Brief HPI: Brian Horn is a 87 y.o. male with a past medical history as noted above.  They were referred to EP for treatment options of atrial fibrillation.  Risks, benefits, and alternatives to Tikosyn were reviewed with the patient who wished to proceed with admission for loading.  Hospital Course:  The patient was admitted and Tikosyn was initiated.  Renal function and electrolytes were followed during the hospitalization.  Their QTc remained stable. On 01/27/23 they underwent direct current cardioversion which restored sinus rhythm. The patients QTc remained stable on Tikosyn. They were monitored on telemetry up to discharge. On the day of discharge, they were examined by Dr. Elberta Fortis  who considered them stable for discharge to home.  Follow-up has been arranged with the Atrial Fibrillation clinic in approximately 1 week.   Physical Exam: Vitals:   01/27/23 2024 01/28/23 0421 01/28/23 0909 01/28/23 1119  BP: (!) 148/86 121/79 (!) 143/87 124/77  Pulse: 67 60 61 (!) 59  Resp: 19 16 18 18   Temp: 98.7 F (37.1 C) 98 F (36.7 C) 98.1 F (36.7 C) 97.8 F (36.6 C)  TempSrc: Oral Oral Oral Oral  SpO2: 94% 95% 97% 98%    GEN- NAD, A&O x 3. Normal affect.  Lungs- CTAB, Normal  effort.  Heart- Regular rate and rhythm. No M/G/R GI- Soft, NT, ND Extremities- No clubbing, cyanosis, or edema Skin- no rash or lesion  Labs:   Lab Results  Component Value Date   WBC 7.8 11/30/2022   HGB 14.4 11/30/2022   HCT 43.3 11/30/2022   MCV 99 (H) 11/30/2022   PLT 160 11/30/2022    Recent Labs  Lab 01/28/23 0527  NA 136  K 3.9  CL 103  CO2 23  BUN 14  CREATININE 0.99  CALCIUM 9.2  GLUCOSE 115*    Discharge Medications:  Allergies as of 01/28/2023       Reactions   Lipitor [atorvastatin Calcium] Other (See Comments)   Myalgias        Medication List     TAKE these medications    acetaminophen 500 MG tablet Commonly known as: TYLENOL Take 500-1,000 mg by mouth every 6 (six) hours as needed (for pain.).   amLODipine 5 MG tablet Commonly known as: NORVASC Take 5 mg by mouth at bedtime.   apixaban 5 MG Tabs tablet Commonly known as: Eliquis Take 1 tablet (5 mg total) by mouth 2 (two) times daily.   benazepril 20 MG tablet Commonly known as: LOTENSIN TAKE 1 TABLET BY MOUTH EVERYDAY AT BEDTIME   brimonidine 0.2 % ophthalmic solution Commonly known as: ALPHAGAN Place 1 drop into both eyes 2 (two) times daily.   CINNAMON PO Take 1 tablet by mouth at bedtime.   CO Q-10 PO Take 1 capsule by mouth  at bedtime.   cyanocobalamin 1000 MCG/ML injection Commonly known as: VITAMIN B12 Inject 1,000 mcg into the muscle every 30 (thirty) days.   dofetilide 250 MCG capsule Commonly known as: TIKOSYN Take 1 capsule (250 mcg total) by mouth 2 (two) times daily.   finasteride 5 MG tablet Commonly known as: PROSCAR Take 5 mg by mouth at bedtime.   PREVACID 24HR PO Take 1 capsule by mouth daily.   PROBIOTIC PO Take 1 capsule by mouth daily.   simvastatin 20 MG tablet Commonly known as: ZOCOR Take 10 mg by mouth at bedtime.   timolol 0.25 % ophthalmic solution Commonly known as: BETIMOL Place 1-2 drops into both eyes 2 (two) times daily.    timolol 0.5 % ophthalmic solution Commonly known as: TIMOPTIC Place 1 drop into both eyes 2 (two) times daily.   VITAMIN D-3 PO Take 1 capsule by mouth at bedtime.   Vyzulta 0.024 % Soln Generic drug: Latanoprostene Bunod Place 1 drop into both eyes daily.        Disposition:  Home with follow up in AF clinic in 1 week as in AVS.   Duration of Discharge Encounter: Greater than 30 minutes including physician time.  Signed, Canary Brim, MSN, APRN, NP-C, AGACNP-BC [facility] - Electrophysiology  01/28/2023, 1:19 PM     I have seen and examined this patient with Canary Brim.  Agree with above, note added to reflect my findings.  Patient admitted to the hospital for dofetilide load.  He was initially in atrial fibrillation.  Required cardioversion.  Now in sinus rhythm.  QTc is remained stable.  Plan for discharge with follow-up in clinic.  GEN: Well nourished, well developed, in no acute distress  HEENT: normal  Neck: no JVD, carotid bruits, or masses Cardiac: RRR; no murmurs, rubs, or gallops,no edema  Respiratory:  clear to auscultation bilaterally, normal work of breathing GI: soft, nontender, nondistended, + BS MS: no deformity or atrophy  Skin: warm and dry, device site well healed Neuro:  Strength and sensation are intact Psych: euthymic mood, full affect     Burnett Spray M. Yarelis Ambrosino MD 01/28/2023 1:38 PM

## 2023-01-27 NOTE — Anesthesia Postprocedure Evaluation (Signed)
Anesthesia Post Note  Patient: Brian Horn  Procedure(s) Performed: CARDIOVERSION     Patient location during evaluation: PACU Anesthesia Type: General Level of consciousness: awake and alert Pain management: pain level controlled Vital Signs Assessment: post-procedure vital signs reviewed and stable Respiratory status: spontaneous breathing, nonlabored ventilation, respiratory function stable and patient connected to nasal cannula oxygen Cardiovascular status: blood pressure returned to baseline and stable Postop Assessment: no apparent nausea or vomiting Anesthetic complications: no  No notable events documented.  Last Vitals:  Vitals:   01/27/23 1145 01/27/23 1220  BP: 136/86 (!) 141/90  Pulse: (!) 59 60  Resp: 13 20  Temp:  (!) 35.9 C  SpO2: 98%     Last Pain:  Vitals:   01/27/23 1220  TempSrc: Axillary  PainSc: 1                  Kennieth Rad

## 2023-01-27 NOTE — Progress Notes (Signed)
Pharmacy: Dofetilide (Tikosyn) - Follow Up Assessment and Electrolyte Replacement  Pharmacy consulted to assist in monitoring and replacing electrolytes in this 87 y.o. male admitted on 01/25/2023 undergoing dofetilide initiation. First dofetilide dose: 10/1 PM  Labs:    Component Value Date/Time   K 4.3 01/27/2023 0405   MG 2.3 01/27/2023 0405     Plan: Potassium: K >/= 4: No additional supplementation needed  Magnesium: Mg > 2: No additional supplementation needed   Thank you for allowing pharmacy to participate in this patient's care   Carron Brazen 01/27/2023  7:16 AM

## 2023-01-27 NOTE — H&P (View-Only) (Signed)
Electrophysiology Rounding Note  Patient Name: Brian Horn Date of Encounter: 01/27/2023  Primary Cardiologist: None  Electrophysiologist: Dakota Stangl Jorja Loa, MD    Subjective   Pt remains in afib on Tikosyn 250 mcg BID   QTc from EKG last pm shows stable QTc at  The patient is doing well today.  At this time, the patient denies chest pain, shortness of breath, or any new concerns.  Inpatient Medications    Scheduled Meds:  acidophilus  1 capsule Oral Daily   amLODipine  5 mg Oral QPM   apixaban  5 mg Oral BID   benazepril  20 mg Oral QHS   brimonidine  1 drop Both Eyes BID   cholecalciferol  1,000 Units Oral QHS   dofetilide  250 mcg Oral BID   finasteride  5 mg Oral QHS   Latanoprostene Bunod  1 drop Both Eyes q AM   multivitamin with minerals  1 tablet Oral QPM   pantoprazole  20 mg Oral QAC breakfast   simvastatin  10 mg Oral QPM   sodium chloride flush  3 mL Intravenous Q12H   timolol  1-2 drop Both Eyes BID   Continuous Infusions:  sodium chloride     sodium chloride     PRN Meds: sodium chloride, acetaminophen, sodium chloride flush   Vital Signs    Vitals:   01/26/23 1214 01/26/23 1832 01/26/23 2011 01/27/23 0329  BP: 123/80 136/74 (!) 143/86 123/84  Pulse: 65   69  Resp: 16  18   Temp: 97.6 F (36.4 C)  97.6 F (36.4 C) 97.8 F (36.6 C)  TempSrc: Oral  Oral Oral  SpO2: 97%  96% 98%    Intake/Output Summary (Last 24 hours) at 01/27/2023 0841 Last data filed at 01/26/2023 1500 Gross per 24 hour  Intake 0 ml  Output --  Net 0 ml   There were no vitals filed for this visit.  Physical Exam    GEN- NAD, A&O x 3. Normal affect.  Lungs- CTAB, Normal effort.  Heart- Regular rate and rhythm. No M/G/R GI- Soft, NT, ND Extremities- No clubbing, cyanosis, or edema Skin- no rash or lesion  Labs    CBC No results for input(s): "WBC", "NEUTROABS", "HGB", "HCT", "MCV", "PLT" in the last 72 hours. Basic Metabolic Panel Recent Labs     01/26/23 0424 01/27/23 0405  NA 138 140  K 3.8 4.3  CL 109 108  CO2 20* 25  GLUCOSE 123* 111*  BUN 12 16  CREATININE 1.05 1.09  CALCIUM 9.0 9.1  MG 2.0 2.3    Telemetry    VP, underlying atrial fibrillation 60's (personally reviewed)  Patient Profile     Brian Horn is a 87 y.o. male with a past medical history significant for persistent atrial fibrillation.  They were admitted for tikosyn load.   Assessment & Plan    Persistent Atrial Fibrillation Pt remains in afib on Tikosyn 250 mcg BID  Continue Eliquis Creatinine, ser  1.09 (10/03 0405) Magnesium  2.3 (10/03 0405) Potassium4.3 (10/03 0405) No electrolyte supplementation needed  If pt does not convert chemically, plan on DCCV today    For questions or updates, please contact [facility] Please consult [email or web address] for contact info under Cardiology/STEMI.  Signed, Canary Brim, MSN, APRN, NP-C, AGACNP-BC [facility] - Electrophysiology  01/27/2023, 8:41 AM  I have seen and examined this patient with Canary Brim.  Agree with above, note added to reflect my findings.  Feeling well without complaint.  Cardioversion planned for today.  GEN: Well nourished, well developed, in no acute distress  HEENT: normal  Neck: no JVD, carotid bruits, or masses Cardiac: RRR; no murmurs, rubs, or gallops,no edema  Respiratory:  clear to auscultation bilaterally, normal work of breathing GI: soft, nontender, nondistended, + BS MS: no deformity or atrophy  Skin: warm and dry, device site well healed Neuro:  Strength and sensation are intact Psych: euthymic mood, full affect   Persistent atrial fibrillation: QTc remained stable.  Currently on dofetilide.  Plan for cardioversion today.  Likely discharge home tomorrow.  Recent labs within normal limits.  Dishon Kehoe M. Leilany Digeronimo MD 01/27/2023 9:52 AM

## 2023-01-27 NOTE — Anesthesia Procedure Notes (Signed)
Procedure Name: MAC Date/Time: 01/27/2023 11:07 AM  Performed by: Audie Pinto, CRNAPre-anesthesia Checklist: Patient identified, Emergency Drugs available, Suction available and Patient being monitored Patient Re-evaluated:Patient Re-evaluated prior to induction Oxygen Delivery Method: Nasal cannula Induction Type: IV induction Placement Confirmation: positive ETCO2 Dental Injury: Teeth and Oropharynx as per pre-operative assessment

## 2023-01-28 ENCOUNTER — Other Ambulatory Visit (HOSPITAL_COMMUNITY): Payer: Self-pay

## 2023-01-28 ENCOUNTER — Encounter (HOSPITAL_COMMUNITY): Payer: Self-pay | Admitting: Cardiology

## 2023-01-28 DIAGNOSIS — I4819 Other persistent atrial fibrillation: Secondary | ICD-10-CM | POA: Diagnosis not present

## 2023-01-28 LAB — BASIC METABOLIC PANEL
Anion gap: 10 (ref 5–15)
BUN: 14 mg/dL (ref 8–23)
CO2: 23 mmol/L (ref 22–32)
Calcium: 9.2 mg/dL (ref 8.9–10.3)
Chloride: 103 mmol/L (ref 98–111)
Creatinine, Ser: 0.99 mg/dL (ref 0.61–1.24)
GFR, Estimated: >60 mL/min (ref >60)
Glucose, Bld: 115 mg/dL — ABNORMAL HIGH (ref 70–99)
Potassium: 3.9 mmol/L (ref 3.5–5.1)
Sodium: 136 mmol/L (ref 135–145)

## 2023-01-28 LAB — MAGNESIUM: Magnesium: 2.2 mg/dL (ref 1.7–2.4)

## 2023-01-28 MED ORDER — POTASSIUM CHLORIDE CRYS ER 20 MEQ PO TBCR
40.0000 meq | EXTENDED_RELEASE_TABLET | Freq: Once | ORAL | Status: AC
  Administered 2023-01-28: 40 meq via ORAL
  Filled 2023-01-28: qty 2

## 2023-01-28 MED ORDER — DOFETILIDE 250 MCG PO CAPS
250.0000 ug | ORAL_CAPSULE | Freq: Two times a day (BID) | ORAL | 5 refills | Status: DC
  Filled 2023-01-28: qty 60, 30d supply, fill #0

## 2023-01-28 MED ORDER — AQUAPHOR EX OINT
TOPICAL_OINTMENT | Freq: Two times a day (BID) | CUTANEOUS | Status: DC | PRN
  Filled 2023-01-28: qty 50

## 2023-01-28 NOTE — Plan of Care (Signed)
  Problem: Education: Goal: Knowledge of General Education information will improve Description: Including pain rating scale, medication(s)/side effects and non-pharmacologic comfort measures 01/28/2023 1307 by Herma Carson, RN Outcome: Adequate for Discharge 01/28/2023 0803 by Herma Carson, RN Outcome: Progressing   Problem: Health Behavior/Discharge Planning: Goal: Ability to manage health-related needs will improve 01/28/2023 1307 by Herma Carson, RN Outcome: Adequate for Discharge 01/28/2023 0803 by Herma Carson, RN Outcome: Progressing   Problem: Clinical Measurements: Goal: Ability to maintain clinical measurements within normal limits will improve 01/28/2023 1307 by Herma Carson, RN Outcome: Adequate for Discharge 01/28/2023 0803 by Herma Carson, RN Outcome: Progressing Goal: Will remain free from infection 01/28/2023 1307 by Herma Carson, RN Outcome: Adequate for Discharge 01/28/2023 0803 by Herma Carson, RN Outcome: Progressing Goal: Diagnostic test results will improve 01/28/2023 1307 by Herma Carson, RN Outcome: Adequate for Discharge 01/28/2023 0803 by Herma Carson, RN Outcome: Progressing Goal: Respiratory complications will improve 01/28/2023 1307 by Herma Carson, RN Outcome: Adequate for Discharge 01/28/2023 0803 by Herma Carson, RN Outcome: Progressing Goal: Cardiovascular complication will be avoided 01/28/2023 1307 by Herma Carson, RN Outcome: Adequate for Discharge 01/28/2023 0803 by Herma Carson, RN Outcome: Progressing   Problem: Activity: Goal: Risk for activity intolerance will decrease 01/28/2023 1307 by Herma Carson, RN Outcome: Adequate for Discharge 01/28/2023 0803 by Herma Carson, RN Outcome: Progressing   Problem: Nutrition: Goal: Adequate nutrition will be maintained 01/28/2023 1307 by Herma Carson, RN Outcome: Adequate for Discharge 01/28/2023 0803 by Herma Carson, RN Outcome: Progressing    Problem: Coping: Goal: Level of anxiety will decrease 01/28/2023 1307 by Herma Carson, RN Outcome: Adequate for Discharge 01/28/2023 0803 by Herma Carson, RN Outcome: Progressing   Problem: Elimination: Goal: Will not experience complications related to bowel motility 01/28/2023 1307 by Herma Carson, RN Outcome: Adequate for Discharge 01/28/2023 0803 by Herma Carson, RN Outcome: Progressing Goal: Will not experience complications related to urinary retention 01/28/2023 1307 by Herma Carson, RN Outcome: Adequate for Discharge 01/28/2023 0803 by Herma Carson, RN Outcome: Progressing   Problem: Pain Managment: Goal: General experience of comfort will improve 01/28/2023 1307 by Herma Carson, RN Outcome: Adequate for Discharge 01/28/2023 0803 by Herma Carson, RN Outcome: Progressing   Problem: Safety: Goal: Ability to remain free from injury will improve 01/28/2023 1307 by Herma Carson, RN Outcome: Adequate for Discharge 01/28/2023 0803 by Herma Carson, RN Outcome: Progressing   Problem: Skin Integrity: Goal: Risk for impaired skin integrity will decrease 01/28/2023 1307 by Herma Carson, RN Outcome: Adequate for Discharge 01/28/2023 0803 by Herma Carson, RN Outcome: Progressing

## 2023-01-28 NOTE — Progress Notes (Signed)
Patient verbalized understanding of dc instructions. All belongings given to patient. Ccmd notified of dc order.

## 2023-01-28 NOTE — Plan of Care (Signed)

## 2023-01-28 NOTE — Progress Notes (Addendum)
Pharmacy: Dofetilide (Tikosyn) - Follow Up Assessment and Electrolyte Replacement  Pharmacy consulted to assist in monitoring and replacing electrolytes in this 87 y.o. male admitted on 01/25/2023 undergoing dofetilide initiation. First dofetilide dose: 10/1 PM  Labs:    Component Value Date/Time   K 3.9 01/28/2023 0527   MG 2.2 01/28/2023 7829     Plan: Potassium: K 3.8-3.9:  Give KCl 40 mEq po x1   Magnesium: Mg > 2: No additional supplementation needed   As patient has required on average 30 mEq of potassium replacement every day, recommend discharging patient with prescription for:  Potassium chloride 20 mEq  daily  Thank you for allowing pharmacy to participate in this patient's care   Carron Brazen 01/28/2023  6:22 AM

## 2023-02-04 ENCOUNTER — Ambulatory Visit (HOSPITAL_COMMUNITY)
Admit: 2023-02-04 | Discharge: 2023-02-04 | Disposition: A | Discharge status: Home / Self Care | Payer: Medicare PPO | Attending: Physician Assistant | Admitting: Physician Assistant

## 2023-02-04 ENCOUNTER — Encounter (HOSPITAL_COMMUNITY): Payer: Self-pay | Admitting: Physician Assistant

## 2023-02-04 VITALS — BP 130/80 | HR 73 | Ht 72.0 in | Wt 175.0 lb

## 2023-02-04 DIAGNOSIS — Z950 Presence of cardiac pacemaker: Secondary | ICD-10-CM | POA: Diagnosis not present

## 2023-02-04 DIAGNOSIS — I4819 Other persistent atrial fibrillation: Secondary | ICD-10-CM | POA: Diagnosis present

## 2023-02-04 DIAGNOSIS — I441 Atrioventricular block, second degree: Secondary | ICD-10-CM | POA: Insufficient documentation

## 2023-02-04 DIAGNOSIS — I10 Essential (primary) hypertension: Secondary | ICD-10-CM | POA: Diagnosis not present

## 2023-02-04 DIAGNOSIS — D6869 Other thrombophilia: Secondary | ICD-10-CM | POA: Insufficient documentation

## 2023-02-04 DIAGNOSIS — Z5181 Encounter for therapeutic drug level monitoring: Secondary | ICD-10-CM

## 2023-02-04 DIAGNOSIS — Z79899 Other long term (current) drug therapy: Secondary | ICD-10-CM | POA: Insufficient documentation

## 2023-02-04 DIAGNOSIS — Z7901 Long term (current) use of anticoagulants: Secondary | ICD-10-CM | POA: Diagnosis not present

## 2023-02-04 LAB — BASIC METABOLIC PANEL
Anion gap: 11 (ref 5–15)
BUN: 14 mg/dL (ref 8–23)
CO2: 22 mmol/L (ref 22–32)
Calcium: 9.4 mg/dL (ref 8.9–10.3)
Chloride: 108 mmol/L (ref 98–111)
Creatinine, Ser: 1.25 mg/dL — ABNORMAL HIGH (ref 0.61–1.24)
GFR, Estimated: 56 mL/min — ABNORMAL LOW (ref >60)
Glucose, Bld: 110 mg/dL — ABNORMAL HIGH (ref 70–99)
Potassium: 4.3 mmol/L (ref 3.5–5.1)
Sodium: 141 mmol/L (ref 135–145)

## 2023-02-04 LAB — MAGNESIUM: Magnesium: 2.2 mg/dL (ref 1.7–2.4)

## 2023-02-04 MED ORDER — DOFETILIDE 250 MCG PO CAPS: 250.0000 ug | ORAL_CAPSULE | Freq: Two times a day (BID) | ORAL | 1 refills | Status: DC

## 2023-02-04 NOTE — Progress Notes (Signed)
Primary Care Physician: Joya Martyr, MD Primary Cardiologist: None Electrophysiologist: Will Jorja Loa, MD  Referring Physician: Dr Elenor Legato Brian Horn is a 87 y.o. male with a history of HTN, HLD, 2nd degree AV block s/p PPM, atrial fibrillation who presents for follow up in the [facility].  The patient was initially on flecainide but this was discontinued due to QRS widening. Patient is on Eliquis for a CHADS2VASC score of 3.  On follow up today, patient is s/p dofetilide admission 10/1-10/4/24 with DCCV on 01/27/23. Patient reports that he has done well since discharge. He does feel that he has more energy in SR. No bleeding issues on anticoagulation.   Today, he denies symptoms of palpitations, chest pain, shortness of breath, orthopnea, PND, lower extremity edema, dizziness, presyncope, syncope, snoring, daytime somnolence, bleeding, or neurologic sequela. The patient is tolerating medications without difficulties and is otherwise without complaint today.    Atrial Fibrillation Risk Factors:  he does not have symptoms or diagnosis of sleep apnea. he does not have a history of rheumatic fever.   Atrial Fibrillation Management history:  Previous antiarrhythmic drugs: flecainide, dofetilide Previous cardioversions: 04/29/17, 01/27/23 Previous ablations: none Anticoagulation history: Eliquis  ROS- All systems are reviewed and negative except as per the HPI above.  Past Medical History:  Diagnosis Date   Blood dyscrasia    hemachromatosis, takes tx q year   ED (erectile dysfunction)    GERD (gastroesophageal reflux disease)    Glaucoma    right eye   Hematuria, gross    History of kidney stones    Hyperlipidemia    Hypertension    Inguinal hernia    Paroxysmal atrial fibrillation (HCC)    Peyronie's disease    Pulmonic stenosis    mild   Tinnitus     Current Outpatient Medications  Medication Sig Dispense Refill    acetaminophen (TYLENOL) 500 MG tablet Take 500-1,000 mg by mouth every 6 (six) hours as needed (for pain.).     amLODipine (NORVASC) 5 MG tablet Take 5 mg by mouth at bedtime.     apixaban (ELIQUIS) 5 MG TABS tablet Take 1 tablet (5 mg total) by mouth 2 (two) times daily. 180 tablet 1   benazepril (LOTENSIN) 20 MG tablet TAKE 1 TABLET BY MOUTH EVERYDAY AT BEDTIME 90 tablet 1   brimonidine (ALPHAGAN) 0.2 % ophthalmic solution Place 1 drop into both eyes 2 (two) times daily.     Cholecalciferol (VITAMIN D-3 PO) Take 1 capsule by mouth at bedtime.     CINNAMON PO Take 1 tablet by mouth at bedtime.     Coenzyme Q10 (CO Q-10 PO) Take 1 capsule by mouth at bedtime.     cyanocobalamin (VITAMIN B12) 1000 MCG/ML injection Inject 1,000 mcg into the muscle every 30 (thirty) days.     dofetilide (TIKOSYN) 250 MCG capsule Take 1 capsule (250 mcg total) by mouth 2 (two) times daily. 60 capsule 5   finasteride (PROSCAR) 5 MG tablet Take 5 mg by mouth at bedtime.     Lansoprazole (PREVACID 24HR PO) Take 1 capsule by mouth daily.     Probiotic Product (PROBIOTIC PO) Take 1 capsule by mouth daily.     simvastatin (ZOCOR) 20 MG tablet Take 10 mg by mouth at bedtime.     timolol (BETIMOL) 0.25 % ophthalmic solution Place 1-2 drops into both eyes 2 (two) times daily.     timolol (TIMOPTIC) 0.5 % ophthalmic  solution Place 1 drop into both eyes 2 (two) times daily.     VYZULTA 0.024 % SOLN Place 1 drop into both eyes daily.     No current facility-administered medications for this encounter.    Physical Exam: BP 130/80   Pulse 73   Ht 6' (1.829 m)   Wt 79.4 kg   BMI 23.73 kg/m   GEN: Well nourished, well developed in no acute distress NECK: No JVD; No carotid bruits CARDIAC: Regular rate and rhythm, no murmurs, rubs, gallops RESPIRATORY:  Clear to auscultation without rales, wheezing or rhonchi  ABDOMEN: Soft, non-tender, non-distended EXTREMITIES:  No edema; No deformity    Wt Readings from Last 3  Encounters:  02/04/23 79.4 kg  01/25/23 79.6 kg  11/30/22 77.8 kg     EKG today demonstrates  A sense V paced rhythm Vent. rate 73 BPM PR interval 186 ms QRS duration 150 ms QT/QTcB 470/517 ms   Echo 10/29/20 demonstrated   1. Mild to moderate pulmonic stenosis is present. Vmax 3.3 m/s, MG 17  mmHG. Similar to prior echo. Appears at the level of the valve. Normal RV  size/function. Given the patient's age, would expect a benign course for  his pulmonic stenosis. Mild to moderate valvular pulmonic stenosis.   2. Left ventricular ejection fraction, by estimation, is 65 to 70%. Left  ventricular ejection fraction by 3D volume is 71 %. The left ventricle has  normal function. The left ventricle has no regional wall motion  abnormalities. Indeterminate diastolic  filling due to E-A fusion. The average left ventricular global  longitudinal strain is -26.6 %. The global longitudinal strain is normal.   3. Right ventricular systolic function is normal. The right ventricular  size is normal. Tricuspid regurgitation signal is inadequate for assessing  PA pressure.   4. The mitral valve is normal in structure. Trivial mitral valve  regurgitation. No evidence of mitral stenosis.   5. The aortic valve is tricuspid. Aortic valve regurgitation is not  visualized. No aortic stenosis is present.   6. The inferior vena cava is normal in size with greater than 50%  respiratory variability, suggesting right atrial pressure of 3 mmHg.   7. 2:1 AV block noted during the study.   Comparison(s): Changes from prior study are noted. EF remains unchanged.  Mild to moderate PS is present. 2:1 AV block present.    CHA2DS2-VASc Score = 3  The patient's score is based upon: CHF History: 0 HTN History: 1 Diabetes History: 0 Stroke History: 0 Vascular Disease History: 0 Age Score: 2 Gender Score: 0       ASSESSMENT AND PLAN: Persistent Atrial Fibrillation (ICD10:  I48.19) The patient's  CHA2DS2-VASc score is 3, indicating a 3.2% annual risk of stroke.   S/p dofetilide admission 10/1-10/4/24 with DCCV on 01/27/23 Patient appears to be maintaining SR Continue dofetilide 250 mcg BID, QT stable Check bmet/mag today Continue Eliquis 5 mg BID  Secondary Hypercoagulable State (ICD10:  D68.69) The patient is at significant risk for stroke/thromboembolism based upon his CHA2DS2-VASc Score of 3.  Continue Apixaban (Eliquis).   HTN Stable on current regimen  2nd degree AV block S/p PPM, followed by Dr Elberta Fortis and the device clinic    Follow up with Dr Elberta Fortis as scheduled.       Jorja Loa PA-C Afib Clinic [facility] [address] [place], [place] [address] [phone]

## 2023-02-04 NOTE — Addendum Note (Signed)
Encounter addended by: Shona Simpson, RN on: 02/04/2023 11:38 AM  Actions taken: Pharmacy for encounter modified, Order list changed

## 2023-02-16 ENCOUNTER — Ambulatory Visit: Payer: Medicare PPO | Attending: Cardiology | Admitting: Cardiology

## 2023-02-16 ENCOUNTER — Encounter: Payer: Self-pay | Admitting: Cardiology

## 2023-02-16 VITALS — BP 140/78 | HR 68 | Ht 72.0 in | Wt 175.0 lb

## 2023-02-16 DIAGNOSIS — I4819 Other persistent atrial fibrillation: Secondary | ICD-10-CM

## 2023-02-16 DIAGNOSIS — D6869 Other thrombophilia: Secondary | ICD-10-CM | POA: Diagnosis not present

## 2023-02-16 DIAGNOSIS — I10 Essential (primary) hypertension: Secondary | ICD-10-CM

## 2023-02-16 DIAGNOSIS — I441 Atrioventricular block, second degree: Secondary | ICD-10-CM

## 2023-02-16 LAB — CUP PACEART INCLINIC DEVICE CHECK
Date Time Interrogation Session: 20241023131544
Implantable Lead Connection Status: 753985
Implantable Lead Connection Status: 753985
Implantable Lead Implant Date: 20220824
Implantable Lead Implant Date: 20220824
Implantable Lead Location: 753859
Implantable Lead Location: 753860
Implantable Lead Model: 5076
Implantable Lead Model: 5076
Implantable Pulse Generator Implant Date: 20220824

## 2023-02-16 NOTE — Progress Notes (Signed)
  Electrophysiology Office Note:   Date:  02/16/2023  ID:  Brian Horn, DOB January 31, 1936, MRN [id number]  Primary Cardiologist: None Electrophysiologist: Brian Marcella Jorja Loa, MD      History of Present Illness:   Brian Horn is a 87 y.o. male with h/o atrial fibrillation, second-degree AV block seen today for routine electrophysiology followup.   Since last being seen in our clinic the patient reports doing well.  He has noted no further episodes of atrial fibrillation since his ablation.  He has quite a bit less fatigue and shortness of breath.  he denies chest pain, palpitations, dyspnea, PND, orthopnea, nausea, vomiting, dizziness, syncope, edema, weight gain, or early satiety.   Review of systems complete and found to be negative unless listed in HPI.      EP Information / Studies Reviewed:    EKG is not ordered today. EKG from 02/04/23 reviewed which showed sinus rhythm, V paced      PPM Interrogation-  reviewed in detail today,  See PACEART report.  Device History: Medtronic Dual Chamber PPM implanted 12/17/20 for Second Degree AV block  Risk Assessment/Calculations:    CHA2DS2-VASc Score = 3   This indicates a 3.2% annual risk of stroke. The patient's score is based upon: CHF History: 0 HTN History: 1 Diabetes History: 0 Stroke History: 0 Vascular Disease History: 0 Age Score: 2 Gender Score: 0            Physical Exam:   VS:  BP (!) 140/78 (BP Location: Right Arm, Patient Position: Sitting, Cuff Size: Large)   Pulse 68   Ht 6' (1.829 m)   Wt 175 lb (79.4 kg)   SpO2 96%   BMI 23.73 kg/m    Wt Readings from Last 3 Encounters:  02/16/23 175 lb (79.4 kg)  02/04/23 175 lb (79.4 kg)  01/25/23 175 lb 6.4 oz (79.6 kg)     GEN: Well nourished, well developed in no acute distress NECK: No JVD; No carotid bruits CARDIAC: Regular rate and rhythm, no murmurs, rubs, gallops RESPIRATORY:  Clear to auscultation without rales, wheezing or rhonchi  ABDOMEN:  Soft, non-tender, non-distended EXTREMITIES:  No edema; No deformity   ASSESSMENT AND PLAN:    Second Degree AV block s/p Medtronic PPM  Normal PPM function See Pace Art report No changes today  2.  Persistent atrial fibrillation: Currently on dofetilide.  Remains in sinus rhythm without further episodes of atrial fibrillation since his ablation.  Continue with current management.  3.  Secondary hypercoagulable state: Currently on Eliquis for atrial fibrillation  4.  Hypertension: Mildly elevated but usually well-controlled  Disposition:   Follow up with Afib Clinic in 6 months  Signed, Brian Bostock Jorja Loa, MD

## 2023-02-16 NOTE — Patient Instructions (Signed)
Medication Instructions:  Your physician recommends that you continue on your current medications as directed. Please refer to the Current Medication list given to you today.  *If you need a refill on your cardiac medications before your next appointment, please call your pharmacy*   Lab Work: None ordered   Testing/Procedures: None ordered   Follow-Up: At [facility], you and your health needs are our priority.  As part of our continuing mission to provide you with exceptional heart care, we have created designated Provider Care Teams.  These Care Teams include your primary Cardiologist (physician) and Advanced Practice Providers (APPs -  Physician Assistants and Nurse Practitioners) who all work together to provide you with the care you need, when you need it.  Your next appointment:   06/02/2023  The format for your next appointment:   In Person  Provider:   You will follow up in the Atrial Fibrillation Clinic located at [facility]. Your provider will be: Clint R. Fenton, PA-C or Lake Bells, PA-C    Thank you for choosing [facility]!!   Dory Horn, RN [phone]  Other Instructions

## 2023-03-03 ENCOUNTER — Ambulatory Visit: Payer: Medicare PPO | Admitting: Physician Assistant

## 2023-03-04 DIAGNOSIS — E538 Deficiency of other specified B group vitamins: Secondary | ICD-10-CM | POA: Diagnosis not present

## 2023-03-16 ENCOUNTER — Other Ambulatory Visit: Payer: Self-pay | Admitting: Cardiology

## 2023-03-30 ENCOUNTER — Ambulatory Visit (INDEPENDENT_AMBULATORY_CARE_PROVIDER_SITE_OTHER): Payer: Medicare PPO

## 2023-03-30 DIAGNOSIS — I441 Atrioventricular block, second degree: Secondary | ICD-10-CM | POA: Diagnosis not present

## 2023-03-30 LAB — CUP PACEART REMOTE DEVICE CHECK
Battery Remaining Longevity: 143 mo
Battery Voltage: 3.02 V
Brady Statistic AP VP Percent: 40.19 %
Brady Statistic AP VS Percent: 0.02 %
Brady Statistic AS VP Percent: 59.36 %
Brady Statistic AS VS Percent: 0.43 %
Brady Statistic RA Percent Paced: 40.35 %
Brady Statistic RV Percent Paced: 99.54 %
Date Time Interrogation Session: 20241203212319
Implantable Lead Connection Status: 753985
Implantable Lead Connection Status: 753985
Implantable Lead Implant Date: 20220824
Implantable Lead Implant Date: 20220824
Implantable Lead Location: 753859
Implantable Lead Location: 753860
Implantable Lead Model: 5076
Implantable Lead Model: 5076
Implantable Pulse Generator Implant Date: 20220824
Lead Channel Impedance Value: 285 Ohm
Lead Channel Impedance Value: 304 Ohm
Lead Channel Impedance Value: 418 Ohm
Lead Channel Impedance Value: 456 Ohm
Lead Channel Pacing Threshold Amplitude: 0.625 V
Lead Channel Pacing Threshold Amplitude: 1 V
Lead Channel Pacing Threshold Pulse Width: 0.4 ms
Lead Channel Pacing Threshold Pulse Width: 0.4 ms
Lead Channel Sensing Intrinsic Amplitude: 15 mV
Lead Channel Sensing Intrinsic Amplitude: 15 mV
Lead Channel Sensing Intrinsic Amplitude: 2.75 mV
Lead Channel Sensing Intrinsic Amplitude: 2.75 mV
Lead Channel Setting Pacing Amplitude: 1.5 V
Lead Channel Setting Pacing Amplitude: 2 V
Lead Channel Setting Pacing Pulse Width: 0.4 ms
Lead Channel Setting Sensing Sensitivity: 0.9 mV
Zone Setting Status: 755011

## 2023-04-04 DIAGNOSIS — E538 Deficiency of other specified B group vitamins: Secondary | ICD-10-CM | POA: Diagnosis not present

## 2023-04-30 ENCOUNTER — Other Ambulatory Visit: Payer: Self-pay | Admitting: Cardiology

## 2023-04-30 DIAGNOSIS — I4819 Other persistent atrial fibrillation: Secondary | ICD-10-CM

## 2023-05-02 NOTE — Telephone Encounter (Signed)
 Prescription refill request for Eliquis received. Indication:afib Last office visit:10/24 Scr:1.25  10/24 Age: 88 Weight:79.4  kg  Prescription refilled

## 2023-05-05 DIAGNOSIS — E538 Deficiency of other specified B group vitamins: Secondary | ICD-10-CM | POA: Diagnosis not present

## 2023-06-02 ENCOUNTER — Encounter (HOSPITAL_COMMUNITY): Payer: Self-pay | Admitting: Internal Medicine

## 2023-06-02 ENCOUNTER — Ambulatory Visit (HOSPITAL_COMMUNITY)
Admission: RE | Admit: 2023-06-02 | Discharge: 2023-06-02 | Disposition: A | Discharge status: Home / Self Care | Payer: Medicare PPO | Source: Ambulatory Visit | Attending: Internal Medicine | Admitting: Internal Medicine

## 2023-06-02 VITALS — BP 122/84 | HR 63 | Ht 72.0 in | Wt 176.2 lb

## 2023-06-02 DIAGNOSIS — Z7901 Long term (current) use of anticoagulants: Secondary | ICD-10-CM | POA: Diagnosis not present

## 2023-06-02 DIAGNOSIS — D6869 Other thrombophilia: Secondary | ICD-10-CM

## 2023-06-02 DIAGNOSIS — Z950 Presence of cardiac pacemaker: Secondary | ICD-10-CM | POA: Diagnosis not present

## 2023-06-02 DIAGNOSIS — I441 Atrioventricular block, second degree: Secondary | ICD-10-CM | POA: Insufficient documentation

## 2023-06-02 DIAGNOSIS — I4819 Other persistent atrial fibrillation: Secondary | ICD-10-CM

## 2023-06-02 DIAGNOSIS — I10 Essential (primary) hypertension: Secondary | ICD-10-CM | POA: Diagnosis not present

## 2023-06-02 DIAGNOSIS — Z5181 Encounter for therapeutic drug level monitoring: Secondary | ICD-10-CM | POA: Diagnosis not present

## 2023-06-02 DIAGNOSIS — Z79899 Other long term (current) drug therapy: Secondary | ICD-10-CM | POA: Diagnosis not present

## 2023-06-02 LAB — BASIC METABOLIC PANEL
Anion gap: 11 (ref 5–15)
BUN: 13 mg/dL (ref 8–23)
CO2: 23 mmol/L (ref 22–32)
Calcium: 9.6 mg/dL (ref 8.9–10.3)
Chloride: 107 mmol/L (ref 98–111)
Creatinine, Ser: 1 mg/dL (ref 0.61–1.24)
GFR, Estimated: >60 mL/min (ref >60)
Glucose, Bld: 96 mg/dL (ref 70–99)
Potassium: 4 mmol/L (ref 3.5–5.1)
Sodium: 141 mmol/L (ref 135–145)

## 2023-06-02 LAB — MAGNESIUM: Magnesium: 2.3 mg/dL (ref 1.7–2.4)

## 2023-06-02 NOTE — Progress Notes (Signed)
 Primary Care Physician: Dwight Trula SQUIBB, MD Primary Cardiologist: None Electrophysiologist: Will Gladis Norton, MD  Referring Physician: Dr Norton Locus Brian Horn is a 88 y.o. male with a history of HTN, HLD, 2nd degree AV block s/p PPM, atrial fibrillation who presents for follow up in the [facility].  The patient was initially on flecainide  but this was discontinued due to QRS widening. Patient is on Eliquis  for a CHADS2VASC score of 3.  On follow up today, patient is s/p dofetilide  admission 10/1-10/4/24 with DCCV on 01/27/23. Patient reports that he has done well since discharge. He does feel that he has more energy in SR. No bleeding issues on anticoagulation.   On follow up 06/02/23, he is here for Tikosyn  surveillance. He is currently in AV paced rhythm. He has had no episodes of Afib since last office visit. 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 No missed doses of Tikosyn  250 mcg or Eliquis  5 mg BID.   Today, he denies symptoms of palpitations, chest pain, shortness of breath, orthopnea, PND, lower extremity edema, dizziness, presyncope, syncope, snoring, daytime somnolence, bleeding, or neurologic sequela. The patient is tolerating medications without difficulties and is otherwise without complaint today.    Atrial Fibrillation Risk Factors:  he does not have symptoms or diagnosis of sleep apnea. he does not have a history of rheumatic fever.   Atrial Fibrillation Management history:  Previous antiarrhythmic drugs: flecainide , dofetilide  Previous cardioversions: 04/29/17, 01/27/23 Previous ablations: none Anticoagulation history: Eliquis   ROS- All systems are reviewed and negative except as per the HPI above.  Past Medical History:  Diagnosis Date   Blood dyscrasia    hemachromatosis, takes tx q year   ED (erectile dysfunction)    GERD (gastroesophageal reflux disease)    Glaucoma    right eye   Hematuria,  gross    History of kidney stones    Hyperlipidemia    Hypertension    Inguinal hernia    Paroxysmal atrial fibrillation (HCC)    Peyronie's disease    Pulmonic stenosis    mild   Tinnitus     Current Outpatient Medications  Medication Sig Dispense Refill   acetaminophen  (TYLENOL ) 500 MG tablet Take 500-1,000 mg by mouth every 6 (six) hours as needed (for pain.).     amLODipine  (NORVASC ) 5 MG tablet Take 5 mg by mouth at bedtime.     benazepril  (LOTENSIN ) 20 MG tablet TAKE 1 TABLET BY MOUTH EVERYDAY AT BEDTIME 90 tablet 3   brimonidine  (ALPHAGAN ) 0.2 % ophthalmic solution Place 1 drop into both eyes 2 (two) times daily.     Cholecalciferol  (VITAMIN D -3 PO) Take 1 capsule by mouth at bedtime.     CINNAMON PO Take 1 tablet by mouth at bedtime.     Coenzyme Q10 (CO Q-10 PO) Take 1 capsule by mouth at bedtime.     cyanocobalamin  (VITAMIN B12) 1000 MCG/ML injection Inject 1,000 mcg into the muscle every 30 (thirty) days.     dofetilide  (TIKOSYN ) 250 MCG capsule Take 1 capsule (250 mcg total) by mouth 2 (two) times daily. 180 capsule 1   ELIQUIS  5 MG TABS tablet TAKE 1 TABLET BY MOUTH TWICE A DAY 180 tablet 1   finasteride  (PROSCAR ) 5 MG tablet Take 5 mg by mouth at bedtime.     Lansoprazole (PREVACID 24HR PO) Take 1 capsule by mouth daily.     Probiotic Product (PROBIOTIC PO) Take  1 capsule by mouth daily.     simvastatin  (ZOCOR ) 20 MG tablet Take 10 mg by mouth at bedtime.     timolol  (BETIMOL ) 0.25 % ophthalmic solution Place 1-2 drops into both eyes 2 (two) times daily.     timolol  (TIMOPTIC ) 0.5 % ophthalmic solution Place 1 drop into both eyes 2 (two) times daily.     VYZULTA  0.024 % SOLN Place 1 drop into both eyes daily.     No current facility-administered medications for this encounter.    Physical Exam: BP 122/84   Pulse 63   Ht 6' (1.829 m)   Wt 79.9 kg   BMI 23.90 kg/m   GEN- The patient is well appearing, alert and oriented x 3 today.   Neck - no JVD or carotid  bruit noted Lungs- Clear to ausculation bilaterally, normal work of breathing Heart- Regular rate and rhythm, no murmurs, rubs or gallops, PMI not laterally displaced Extremities- no clubbing, cyanosis, or edema Skin - no rash or ecchymosis noted   Wt Readings from Last 3 Encounters:  06/02/23 79.9 kg  02/16/23 79.4 kg  02/04/23 79.4 kg     EKG today demonstrates  Vent. rate 63 BPM PR interval 168 ms QRS duration 152 ms QT/QTcB 486/497 ms P-R-T axes 28 269 44 Atrial-sensed ventricular-paced rhythm Abnormal ECG When compared with ECG of 04-Feb-2023 10:43, PREVIOUS ECG IS PRESENT  Echo 10/29/20 demonstrated   1. Mild to moderate pulmonic stenosis is present. Vmax 3.3 m/s, MG 17  mmHG. Similar to prior echo. Appears at the level of the valve. Normal RV  size/function. Given the patient's age, would expect a benign course for  his pulmonic stenosis. Mild to moderate valvular pulmonic stenosis.   2. Left ventricular ejection fraction, by estimation, is 65 to 70%. Left  ventricular ejection fraction by 3D volume is 71 %. The left ventricle has  normal function. The left ventricle has no regional wall motion  abnormalities. Indeterminate diastolic  filling due to E-A fusion. The average left ventricular global  longitudinal strain is -26.6 %. The global longitudinal strain is normal.   3. Right ventricular systolic function is normal. The right ventricular  size is normal. Tricuspid regurgitation signal is inadequate for assessing  PA pressure.   4. The mitral valve is normal in structure. Trivial mitral valve  regurgitation. No evidence of mitral stenosis.   5. The aortic valve is tricuspid. Aortic valve regurgitation is not  visualized. No aortic stenosis is present.   6. The inferior vena cava is normal in size with greater than 50%  respiratory variability, suggesting right atrial pressure of 3 mmHg.   7. 2:1 AV block noted during the study.   Comparison(s): Changes from  prior study are noted. EF remains unchanged.  Mild to moderate PS is present. 2:1 AV block present.    CHA2DS2-VASc Score = 3  The patient's score is based upon: CHF History: 0 HTN History: 1 Diabetes History: 0 Stroke History: 0 Vascular Disease History: 0 Age Score: 2 Gender Score: 0       ASSESSMENT AND PLAN: Persistent Atrial Fibrillation (ICD10:  I48.19) The patient's CHA2DS2-VASc score is 3, indicating a 3.2% annual risk of stroke.   S/p dofetilide  admission 10/1-10/4/24 with DCCV on 01/27/23.  Patient is currently in AV paced rhythm.  High risk medication monitoring (ICD10: [id number]) Patient requires ongoing monitoring for anti-arrhythmic medication which has the potential to cause life threatening arrhythmias or AV block. Qtc stable. Corrected 465 ms.  Continue Tikosyn  250 mcg BID. Bmet and mag drawn today.    Secondary Hypercoagulable State (ICD10:  D68.69) The patient is at significant risk for stroke/thromboembolism based upon his CHA2DS2-VASc Score of 3.  Continue Apixaban  (Eliquis ).  No missed doses.   HTN Looks stable today no changes.  2nd degree AV block S/p PPM, followed by Dr Inocencio and the device clinic.    Follow up 3 months for Tikosyn  surveillance.    Dorn Heinrich, PA-C Afib Clinic [facility] [address] [place], [place] [address] [phone]

## 2023-06-03 ENCOUNTER — Other Ambulatory Visit (HOSPITAL_COMMUNITY): Payer: Self-pay | Admitting: Physician Assistant

## 2023-06-07 DIAGNOSIS — E538 Deficiency of other specified B group vitamins: Secondary | ICD-10-CM | POA: Diagnosis not present

## 2023-06-29 ENCOUNTER — Ambulatory Visit: Payer: Medicare PPO

## 2023-06-29 DIAGNOSIS — I441 Atrioventricular block, second degree: Secondary | ICD-10-CM

## 2023-07-01 LAB — CUP PACEART REMOTE DEVICE CHECK
Battery Remaining Longevity: 138 mo
Battery Voltage: 3.02 V
Brady Statistic AP VP Percent: 39.27 %
Brady Statistic AP VS Percent: 0.01 %
Brady Statistic AS VP Percent: 60.53 %
Brady Statistic AS VS Percent: 0.18 %
Brady Statistic RA Percent Paced: 39.32 %
Brady Statistic RV Percent Paced: 99.81 %
Date Time Interrogation Session: 20250304215638
Implantable Lead Connection Status: 753985
Implantable Lead Connection Status: 753985
Implantable Lead Implant Date: 20220824
Implantable Lead Implant Date: 20220824
Implantable Lead Location: 753859
Implantable Lead Location: 753860
Implantable Lead Model: 5076
Implantable Lead Model: 5076
Implantable Pulse Generator Implant Date: 20220824
Lead Channel Impedance Value: 285 Ohm
Lead Channel Impedance Value: 304 Ohm
Lead Channel Impedance Value: 418 Ohm
Lead Channel Impedance Value: 532 Ohm
Lead Channel Pacing Threshold Amplitude: 0.75 V
Lead Channel Pacing Threshold Amplitude: 0.875 V
Lead Channel Pacing Threshold Pulse Width: 0.4 ms
Lead Channel Pacing Threshold Pulse Width: 0.4 ms
Lead Channel Sensing Intrinsic Amplitude: 15 mV
Lead Channel Sensing Intrinsic Amplitude: 15 mV
Lead Channel Sensing Intrinsic Amplitude: 3 mV
Lead Channel Sensing Intrinsic Amplitude: 3 mV
Lead Channel Setting Pacing Amplitude: 1.5 V
Lead Channel Setting Pacing Amplitude: 2 V
Lead Channel Setting Pacing Pulse Width: 0.4 ms
Lead Channel Setting Sensing Sensitivity: 0.9 mV
Zone Setting Status: 755011

## 2023-07-07 DIAGNOSIS — K219 Gastro-esophageal reflux disease without esophagitis: Secondary | ICD-10-CM | POA: Diagnosis not present

## 2023-07-07 DIAGNOSIS — Z85038 Personal history of other malignant neoplasm of large intestine: Secondary | ICD-10-CM | POA: Diagnosis not present

## 2023-07-07 DIAGNOSIS — I480 Paroxysmal atrial fibrillation: Secondary | ICD-10-CM | POA: Diagnosis not present

## 2023-07-07 DIAGNOSIS — D6869 Other thrombophilia: Secondary | ICD-10-CM | POA: Diagnosis not present

## 2023-07-07 DIAGNOSIS — I10 Essential (primary) hypertension: Secondary | ICD-10-CM | POA: Diagnosis not present

## 2023-07-07 DIAGNOSIS — D510 Vitamin B12 deficiency anemia due to intrinsic factor deficiency: Secondary | ICD-10-CM | POA: Diagnosis not present

## 2023-07-08 DIAGNOSIS — E538 Deficiency of other specified B group vitamins: Secondary | ICD-10-CM | POA: Diagnosis not present

## 2023-07-29 DIAGNOSIS — H401133 Primary open-angle glaucoma, bilateral, severe stage: Secondary | ICD-10-CM | POA: Diagnosis not present

## 2023-08-08 DIAGNOSIS — E538 Deficiency of other specified B group vitamins: Secondary | ICD-10-CM | POA: Diagnosis not present

## 2023-08-11 NOTE — Progress Notes (Signed)
 Remote pacemaker transmission.

## 2023-08-11 NOTE — Addendum Note (Signed)
 Addended by: Lott Rouleau A on: 08/11/2023 01:47 PM   Modules accepted: Orders

## 2023-08-30 DIAGNOSIS — L820 Inflamed seborrheic keratosis: Secondary | ICD-10-CM | POA: Diagnosis not present

## 2023-08-30 DIAGNOSIS — L570 Actinic keratosis: Secondary | ICD-10-CM | POA: Diagnosis not present

## 2023-08-30 DIAGNOSIS — D2272 Melanocytic nevi of left lower limb, including hip: Secondary | ICD-10-CM | POA: Diagnosis not present

## 2023-08-30 DIAGNOSIS — L821 Other seborrheic keratosis: Secondary | ICD-10-CM | POA: Diagnosis not present

## 2023-08-30 DIAGNOSIS — Z85828 Personal history of other malignant neoplasm of skin: Secondary | ICD-10-CM | POA: Diagnosis not present

## 2023-08-30 DIAGNOSIS — D692 Other nonthrombocytopenic purpura: Secondary | ICD-10-CM | POA: Diagnosis not present

## 2023-08-30 DIAGNOSIS — D225 Melanocytic nevi of trunk: Secondary | ICD-10-CM | POA: Diagnosis not present

## 2023-08-30 DIAGNOSIS — L814 Other melanin hyperpigmentation: Secondary | ICD-10-CM | POA: Diagnosis not present

## 2023-09-01 ENCOUNTER — Encounter (HOSPITAL_COMMUNITY): Payer: Self-pay | Admitting: Internal Medicine

## 2023-09-01 ENCOUNTER — Ambulatory Visit (HOSPITAL_COMMUNITY)
Admission: RE | Admit: 2023-09-01 | Discharge: 2023-09-01 | Disposition: A | Discharge status: Home / Self Care | Payer: Medicare PPO | Source: Ambulatory Visit | Attending: Internal Medicine | Admitting: Internal Medicine

## 2023-09-01 VITALS — BP 134/78 | HR 66 | Ht 72.0 in | Wt 176.8 lb

## 2023-09-01 DIAGNOSIS — Z79899 Other long term (current) drug therapy: Secondary | ICD-10-CM

## 2023-09-01 DIAGNOSIS — Z5181 Encounter for therapeutic drug level monitoring: Secondary | ICD-10-CM

## 2023-09-01 DIAGNOSIS — I4819 Other persistent atrial fibrillation: Secondary | ICD-10-CM

## 2023-09-01 DIAGNOSIS — D6869 Other thrombophilia: Secondary | ICD-10-CM | POA: Diagnosis not present

## 2023-09-01 NOTE — Progress Notes (Signed)
 Primary Care Physician: Tena Feeling, MD Primary Cardiologist: None Electrophysiologist: Will Cortland Ding, MD  Referring Physician: Dr Pincus Bridgeman Brian Horn is a 88 y.o. male with a history of HTN, HLD, 2nd degree AV block s/p PPM, atrial fibrillation who presents for follow up in the [facility].  The patient was initially on flecainide  but this was discontinued due to QRS widening. Patient is on Eliquis  for a CHADS2VASC score of 3. S/p dofetilide  admission 10/1-10/4/24 with DCCV on 01/27/23.   On follow up 09/01/23, he is here for Tikosyn  surveillance. He has had no episodes of Afib since last office visit. 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 No missed doses of Tikosyn  or Eliquis .   Today, he denies symptoms of palpitations, chest pain, shortness of breath, orthopnea, PND, lower extremity edema, dizziness, presyncope, syncope, snoring, daytime somnolence, bleeding, or neurologic sequela. The patient is tolerating medications without difficulties and is otherwise without complaint today.    Atrial Fibrillation Risk Factors:  he does not have symptoms or diagnosis of sleep apnea. he does not have a history of rheumatic fever.   Atrial Fibrillation Management history:  Previous antiarrhythmic drugs: flecainide , dofetilide  Previous cardioversions: 04/29/17, 01/27/23 Previous ablations: none Anticoagulation history: Eliquis   ROS- All systems are reviewed and negative except as per the HPI above.  Past Medical History:  Diagnosis Date   Blood dyscrasia    hemachromatosis, takes tx q year   ED (erectile dysfunction)    GERD (gastroesophageal reflux disease)    Glaucoma    right eye   Hematuria, gross    History of kidney stones    Hyperlipidemia    Hypertension    Inguinal hernia    Paroxysmal atrial fibrillation (HCC)    Peyronie's disease    Pulmonic stenosis    mild   Tinnitus     Current Outpatient Medications   Medication Sig Dispense Refill   acetaminophen  (TYLENOL ) 500 MG tablet Take 500-1,000 mg by mouth every 6 (six) hours as needed (for pain.).     amLODipine  (NORVASC ) 5 MG tablet Take 5 mg by mouth at bedtime.     benazepril  (LOTENSIN ) 20 MG tablet TAKE 1 TABLET BY MOUTH EVERYDAY AT BEDTIME 90 tablet 3   brimonidine  (ALPHAGAN ) 0.2 % ophthalmic solution Place 1 drop into both eyes 2 (two) times daily.     Cholecalciferol  (VITAMIN D -3 PO) Take 1 capsule by mouth at bedtime.     CINNAMON PO Take 1 tablet by mouth at bedtime.     Coenzyme Q10 (CO Q-10 PO) Take 1 capsule by mouth at bedtime.     cyanocobalamin  (VITAMIN B12) 1000 MCG/ML injection Inject 1,000 mcg into the muscle every 30 (thirty) days.     dofetilide  (TIKOSYN ) 250 MCG capsule TAKE 1 CAPSULE BY MOUTH 2 TIMES DAILY. 180 capsule 1   ELIQUIS  5 MG TABS tablet TAKE 1 TABLET BY MOUTH TWICE A DAY 180 tablet 1   finasteride  (PROSCAR ) 5 MG tablet Take 5 mg by mouth at bedtime.     Lansoprazole (PREVACID 24HR PO) Take 1 capsule by mouth daily.     Probiotic Product (PROBIOTIC PO) Take 1 capsule by mouth daily.     simvastatin  (ZOCOR ) 20 MG tablet Take 10 mg by mouth at bedtime.     timolol  (BETIMOL ) 0.25 % ophthalmic solution Place 1-2 drops into both eyes 2 (two) times daily.     timolol  (TIMOPTIC ) 0.5 % ophthalmic solution  Place 1 drop into both eyes 2 (two) times daily.     VYZULTA  0.024 % SOLN Place 1 drop into both eyes daily.     No current facility-administered medications for this encounter.    Physical Exam: BP 134/78   Pulse 66   Ht 6' (1.829 m)   Wt 80.2 kg   BMI 23.98 kg/m   GEN- The patient is well appearing, alert and oriented x 3 today.   Neck - no JVD or carotid bruit noted Lungs- Clear to ausculation bilaterally, normal work of breathing Heart- Regular rate and rhythm, no murmurs, rubs or gallops, PMI not laterally displaced Extremities- no clubbing, cyanosis, or edema Skin - no rash or ecchymosis noted   Wt  Readings from Last 3 Encounters:  09/01/23 80.2 kg  06/02/23 79.9 kg  02/16/23 79.4 kg     EKG today demonstrates  Vent. rate 66 BPM PR interval 180 ms QRS duration 162 ms QT/QTcB 484/507 ms P-R-T axes 62 -88 60 Atrial-sensed ventricular-paced rhythm Abnormal ECG When compared with ECG of 02-Jun-2023 09:55, Vent. rate has increased BY 3 BPM  Echo 10/29/20 demonstrated   1. Mild to moderate pulmonic stenosis is present. Vmax 3.3 m/s, MG 17  mmHG. Similar to prior echo. Appears at the level of the valve. Normal RV  size/function. Given the patient's age, would expect a benign course for  his pulmonic stenosis. Mild to moderate valvular pulmonic stenosis.   2. Left ventricular ejection fraction, by estimation, is 65 to 70%. Left  ventricular ejection fraction by 3D volume is 71 %. The left ventricle has  normal function. The left ventricle has no regional wall motion  abnormalities. Indeterminate diastolic  filling due to E-A fusion. The average left ventricular global  longitudinal strain is -26.6 %. The global longitudinal strain is normal.   3. Right ventricular systolic function is normal. The right ventricular  size is normal. Tricuspid regurgitation signal is inadequate for assessing  PA pressure.   4. The mitral valve is normal in structure. Trivial mitral valve  regurgitation. No evidence of mitral stenosis.   5. The aortic valve is tricuspid. Aortic valve regurgitation is not  visualized. No aortic stenosis is present.   6. The inferior vena cava is normal in size with greater than 50%  respiratory variability, suggesting right atrial pressure of 3 mmHg.   7. 2:1 AV block noted during the study.   Comparison(s): Changes from prior study are noted. EF remains unchanged.  Mild to moderate PS is present. 2:1 AV block present.    CHA2DS2-VASc Score = 3  The patient's score is based upon: CHF History: 0 HTN History: 1 Diabetes History: 0 Stroke History: 0 Vascular  Disease History: 0 Age Score: 2 Gender Score: 0       ASSESSMENT AND PLAN: Persistent Atrial Fibrillation (ICD10:  I48.19) The patient's CHA2DS2-VASc score is 3, indicating a 3.2% annual risk of stroke.   S/p dofetilide  admission 10/1-10/4/24 with DCCV on 01/27/23.  Patient is currently in AV paced rhythm. Continue current regimen without change. He is doing well with low burden on Tikosyn .    High risk medication monitoring (ICD10: J342684) Patient requires ongoing monitoring for anti-arrhythmic medication which has the potential to cause life threatening arrhythmias or AV block. Qtc stable. Manual correction 464 ms. Continue Tikosyn  250 mcg BID. Bmet and mag drawn today.  Secondary Hypercoagulable State (ICD10:  D68.69) The patient is at significant risk for stroke/thromboembolism based upon his CHA2DS2-VASc Score of 3.  Continue Apixaban  (Eliquis ).  No missed doses.   HTN Stable today.   2nd degree AV block S/p PPM, followed by Dr Lawana Pray and the device clinic.    Follow up 3 months for Tikosyn  surveillance.    Woody Heading, PA-C Afib Clinic [facility] [address] [place], [place] [address] [phone]

## 2023-09-02 LAB — BASIC METABOLIC PANEL WITH GFR
BUN/Creatinine Ratio: 14 (ref 10–24)
BUN: 14 mg/dL (ref 8–27)
CO2: 20 mmol/L (ref 20–29)
Calcium: 9.6 mg/dL (ref 8.6–10.2)
Chloride: 105 mmol/L (ref 96–106)
Creatinine, Ser: 0.97 mg/dL (ref 0.76–1.27)
Glucose: 104 mg/dL — ABNORMAL HIGH (ref 70–99)
Potassium: 4.1 mmol/L (ref 3.5–5.2)
Sodium: 142 mmol/L (ref 134–144)
eGFR: 76 mL/min/{1.73_m2} (ref >59)

## 2023-09-02 LAB — MAGNESIUM: Magnesium: 2.2 mg/dL (ref 1.6–2.3)

## 2023-09-08 DIAGNOSIS — E538 Deficiency of other specified B group vitamins: Secondary | ICD-10-CM | POA: Diagnosis not present

## 2023-09-28 ENCOUNTER — Ambulatory Visit (INDEPENDENT_AMBULATORY_CARE_PROVIDER_SITE_OTHER): Payer: Medicare PPO

## 2023-09-28 DIAGNOSIS — I441 Atrioventricular block, second degree: Secondary | ICD-10-CM

## 2023-09-28 LAB — CUP PACEART REMOTE DEVICE CHECK
Battery Remaining Longevity: 133 mo
Battery Voltage: 3.02 V
Brady Statistic AP VP Percent: 44.14 %
Brady Statistic AP VS Percent: 0 %
Brady Statistic AS VP Percent: 55.81 %
Brady Statistic AS VS Percent: 0.05 %
Brady Statistic RA Percent Paced: 44.11 %
Brady Statistic RV Percent Paced: 99.95 %
Date Time Interrogation Session: 20250603213001
Implantable Lead Connection Status: 753985
Implantable Lead Connection Status: 753985
Implantable Lead Implant Date: 20220824
Implantable Lead Implant Date: 20220824
Implantable Lead Location: 753859
Implantable Lead Location: 753860
Implantable Lead Model: 5076
Implantable Lead Model: 5076
Implantable Pulse Generator Implant Date: 20220824
Lead Channel Impedance Value: 304 Ohm
Lead Channel Impedance Value: 304 Ohm
Lead Channel Impedance Value: 418 Ohm
Lead Channel Impedance Value: 494 Ohm
Lead Channel Pacing Threshold Amplitude: 0.625 V
Lead Channel Pacing Threshold Amplitude: 0.75 V
Lead Channel Pacing Threshold Pulse Width: 0.4 ms
Lead Channel Pacing Threshold Pulse Width: 0.4 ms
Lead Channel Sensing Intrinsic Amplitude: 15 mV
Lead Channel Sensing Intrinsic Amplitude: 15 mV
Lead Channel Sensing Intrinsic Amplitude: 2.625 mV
Lead Channel Sensing Intrinsic Amplitude: 2.625 mV
Lead Channel Setting Pacing Amplitude: 1.5 V
Lead Channel Setting Pacing Amplitude: 2 V
Lead Channel Setting Pacing Pulse Width: 0.4 ms
Lead Channel Setting Sensing Sensitivity: 0.9 mV
Zone Setting Status: 755011

## 2023-10-07 ENCOUNTER — Ambulatory Visit: Payer: Self-pay | Admitting: Cardiology

## 2023-10-10 DIAGNOSIS — E538 Deficiency of other specified B group vitamins: Secondary | ICD-10-CM | POA: Diagnosis not present

## 2023-11-10 DIAGNOSIS — E538 Deficiency of other specified B group vitamins: Secondary | ICD-10-CM | POA: Diagnosis not present

## 2023-11-12 ENCOUNTER — Other Ambulatory Visit: Payer: Self-pay | Admitting: Cardiology

## 2023-11-12 DIAGNOSIS — I4819 Other persistent atrial fibrillation: Secondary | ICD-10-CM

## 2023-11-14 NOTE — Telephone Encounter (Signed)
 Prescription refill request for Brian Horn  received. Indication: AF Last office visit: 09/01/23  [name] PA-C Scr: 0.97 on 09/01/23  Epic Age: 88 Weight: 80.2kg  Based on above findings Brian Horn  5mg  twice daily is the appropriate dose.  Refill approved.

## 2023-11-17 NOTE — Progress Notes (Signed)
 Remote pacemaker transmission.

## 2023-11-29 DIAGNOSIS — H401133 Primary open-angle glaucoma, bilateral, severe stage: Secondary | ICD-10-CM | POA: Diagnosis not present

## 2023-12-01 ENCOUNTER — Ambulatory Visit (HOSPITAL_COMMUNITY)
Admission: RE | Admit: 2023-12-01 | Discharge: 2023-12-01 | Disposition: A | Discharge status: Home / Self Care | Source: Ambulatory Visit | Attending: Internal Medicine | Admitting: Internal Medicine

## 2023-12-01 VITALS — BP 140/76 | HR 67 | Ht 72.0 in | Wt 171.0 lb

## 2023-12-01 DIAGNOSIS — I4819 Other persistent atrial fibrillation: Secondary | ICD-10-CM | POA: Diagnosis not present

## 2023-12-01 DIAGNOSIS — Z79899 Other long term (current) drug therapy: Secondary | ICD-10-CM

## 2023-12-01 DIAGNOSIS — Z5181 Encounter for therapeutic drug level monitoring: Secondary | ICD-10-CM | POA: Diagnosis not present

## 2023-12-01 DIAGNOSIS — I4891 Unspecified atrial fibrillation: Secondary | ICD-10-CM | POA: Diagnosis not present

## 2023-12-01 DIAGNOSIS — D6869 Other thrombophilia: Secondary | ICD-10-CM | POA: Diagnosis not present

## 2023-12-01 NOTE — Progress Notes (Signed)
 Primary Care Physician: Dwight Trula SQUIBB, MD Primary Cardiologist: None Electrophysiologist: Will Gladis Norton, MD  Referring Physician: Dr Norton Locus SHOUA RESSLER is a 88 y.o. male with a history of HTN, HLD, 2nd degree AV block s/p PPM, atrial fibrillation who presents for follow up in the [facility].  The patient was initially on flecainide  but this was discontinued due to QRS widening. Patient is on Eliquis  for a CHADS2VASC score of 3. S/p dofetilide  admission 10/1-10/4/24 with DCCV on 01/27/23.   On follow up 12/01/23, patient is here for Tikosyn  surveillance. He is currently in AV paced rhythm. Most recent device check in June reassuring with low burden. No missed doses of Tikosyn  or Eliquis .   Today, he denies symptoms of palpitations, chest pain, shortness of breath, orthopnea, PND, lower extremity edema, dizziness, presyncope, syncope, snoring, daytime somnolence, bleeding, or neurologic sequela. The patient is tolerating medications without difficulties and is otherwise without complaint today.    Atrial Fibrillation Risk Factors:  he does not have symptoms or diagnosis of sleep apnea. he does not have a history of rheumatic fever.   Atrial Fibrillation Management history:  Previous antiarrhythmic drugs: flecainide , dofetilide  Previous cardioversions: 04/29/17, 01/27/23 Previous ablations: none Anticoagulation history: Eliquis   ROS- All systems are reviewed and negative except as per the HPI above.  Past Medical History:  Diagnosis Date   Blood dyscrasia    hemachromatosis, takes tx q year   ED (erectile dysfunction)    GERD (gastroesophageal reflux disease)    Glaucoma    right eye   Hematuria, gross    History of kidney stones    Hyperlipidemia    Hypertension    Inguinal hernia    Paroxysmal atrial fibrillation (HCC)    Peyronie's disease    Pulmonic stenosis    mild   Tinnitus     Current Outpatient Medications  Medication  Sig Dispense Refill   acetaminophen  (TYLENOL ) 500 MG tablet Take 500-1,000 mg by mouth every 6 (six) hours as needed (for pain.). (Patient taking differently: Take 500-1,000 mg by mouth as needed (for pain.).)     amLODipine  (NORVASC ) 5 MG tablet Take 5 mg by mouth at bedtime.     benazepril  (LOTENSIN ) 20 MG tablet TAKE 1 TABLET BY MOUTH EVERYDAY AT BEDTIME 90 tablet 3   brimonidine  (ALPHAGAN ) 0.2 % ophthalmic solution Place 1 drop into both eyes 2 (two) times daily.     Cholecalciferol  (VITAMIN D -3 PO) Take 1 capsule by mouth at bedtime.     CINNAMON PO Take 1 tablet by mouth at bedtime.     Coenzyme Q10 (CO Q-10 PO) Take 1 capsule by mouth at bedtime.     cyanocobalamin  (VITAMIN B12) 1000 MCG/ML injection Inject 1,000 mcg into the muscle every 30 (thirty) days.     dofetilide  (TIKOSYN ) 250 MCG capsule TAKE 1 CAPSULE BY MOUTH 2 TIMES DAILY. 180 capsule 1   ELIQUIS  5 MG TABS tablet TAKE 1 TABLET BY MOUTH TWICE A DAY 180 tablet 1   finasteride  (PROSCAR ) 5 MG tablet Take 5 mg by mouth at bedtime.     Lansoprazole (PREVACID 24HR PO) Take 1 capsule by mouth daily.     Probiotic Product (PROBIOTIC PO) Take 1 capsule by mouth daily.     simvastatin  (ZOCOR ) 20 MG tablet Take 10 mg by mouth at bedtime.     timolol  (TIMOPTIC ) 0.5 % ophthalmic solution Place 1 drop into both eyes 2 (two) times daily.  VYZULTA  0.024 % SOLN Place 1 drop into both eyes daily.     No current facility-administered medications for this encounter.    Physical Exam: BP (!) 140/76   Pulse 67   Ht 6' (1.829 m)   Wt 77.6 kg   BMI 23.19 kg/m   GEN- The patient is well appearing, alert and oriented x 3 today.   Neck - no JVD or carotid bruit noted Lungs- Clear to ausculation bilaterally, normal work of breathing Heart- Regular rate and rhythm, no murmurs, rubs or gallops, PMI not laterally displaced Extremities- no clubbing, cyanosis, or edema Skin - no rash or ecchymosis noted   Wt Readings from Last 3 Encounters:   12/01/23 77.6 kg  09/01/23 80.2 kg  06/02/23 79.9 kg     EKG today demonstrates  Vent. rate 67 BPM PR interval 220 ms QRS duration 130 ms QT/QTcB 442/467 ms P-R-T axes 77 -88 92 Atrial-sensed ventricular-paced rhythm with prolonged AV conduction Abnormal ECG When compared with ECG of 01-Sep-2023 09:54, No significant change was found  Echo 10/29/20 demonstrated   1. Mild to moderate pulmonic stenosis is present. Vmax 3.3 m/s, MG 17  mmHG. Similar to prior echo. Appears at the level of the valve. Normal RV  size/function. Given the patient's age, would expect a benign course for  his pulmonic stenosis. Mild to moderate valvular pulmonic stenosis.   2. Left ventricular ejection fraction, by estimation, is 65 to 70%. Left  ventricular ejection fraction by 3D volume is 71 %. The left ventricle has  normal function. The left ventricle has no regional wall motion  abnormalities. Indeterminate diastolic  filling due to E-A fusion. The average left ventricular global  longitudinal strain is -26.6 %. The global longitudinal strain is normal.   3. Right ventricular systolic function is normal. The right ventricular  size is normal. Tricuspid regurgitation signal is inadequate for assessing  PA pressure.   4. The mitral valve is normal in structure. Trivial mitral valve  regurgitation. No evidence of mitral stenosis.   5. The aortic valve is tricuspid. Aortic valve regurgitation is not  visualized. No aortic stenosis is present.   6. The inferior vena cava is normal in size with greater than 50%  respiratory variability, suggesting right atrial pressure of 3 mmHg.   7. 2:1 AV block noted during the study.   Comparison(s): Changes from prior study are noted. EF remains unchanged.  Mild to moderate PS is present. 2:1 AV block present.    CHA2DS2-VASc Score = 3  The patient's score is based upon: CHF History: 0 HTN History: 1 Diabetes History: 0 Stroke History: 0 Vascular Disease  History: 0 Age Score: 2 Gender Score: 0       ASSESSMENT AND PLAN: Persistent Atrial Fibrillation (ICD10:  I48.19) The patient's CHA2DS2-VASc score is 3, indicating a 3.2% annual risk of stroke.   S/p dofetilide  admission 10/1-10/4/24 with DCCV on 01/27/23.  Patient is currently in AV paced rhythm. Most recent device check in June was reassuring with low burden.  High risk medication monitoring (ICD10: U5195107) Patient requires ongoing monitoring for anti-arrhythmic medication which has the potential to cause life threatening arrhythmias or AV block. Qtc stable. Continue Tikosyn  250 mcg BID. Bmet and mag drawn today.   Secondary Hypercoagulable State (ICD10:  D68.69) The patient is at significant risk for stroke/thromboembolism based upon his CHA2DS2-VASc Score of 3.  Continue Apixaban  (Eliquis ).  No missed doses.   HTN Stable today.   2nd degree AV block  S/p PPM, followed by Dr Inocencio and the device clinic.    Follow up 3 months for Tikosyn  surveillance.    Dorn Heinrich, PA-C Afib Clinic [facility] [address] [place], [place] [address] [phone]

## 2023-12-02 ENCOUNTER — Ambulatory Visit (HOSPITAL_COMMUNITY): Payer: Self-pay | Admitting: Internal Medicine

## 2023-12-02 LAB — BASIC METABOLIC PANEL WITH GFR
BUN/Creatinine Ratio: 13 (ref 10–24)
BUN: 13 mg/dL (ref 8–27)
CO2: 19 mmol/L — ABNORMAL LOW (ref 20–29)
Calcium: 9.4 mg/dL (ref 8.6–10.2)
Chloride: 108 mmol/L — ABNORMAL HIGH (ref 96–106)
Creatinine, Ser: 0.99 mg/dL (ref 0.76–1.27)
Glucose: 99 mg/dL (ref 70–99)
Potassium: 4.3 mmol/L (ref 3.5–5.2)
Sodium: 144 mmol/L (ref 134–144)
eGFR: 74 mL/min/1.73 (ref >59)

## 2023-12-02 LAB — MAGNESIUM: Magnesium: 2.2 mg/dL (ref 1.6–2.3)

## 2023-12-12 DIAGNOSIS — E538 Deficiency of other specified B group vitamins: Secondary | ICD-10-CM | POA: Diagnosis not present

## 2023-12-28 ENCOUNTER — Ambulatory Visit (INDEPENDENT_AMBULATORY_CARE_PROVIDER_SITE_OTHER)

## 2023-12-28 DIAGNOSIS — I4891 Unspecified atrial fibrillation: Secondary | ICD-10-CM | POA: Diagnosis not present

## 2023-12-29 ENCOUNTER — Ambulatory Visit: Payer: Self-pay | Admitting: Cardiology

## 2023-12-29 LAB — CUP PACEART REMOTE DEVICE CHECK
Battery Remaining Longevity: 129 mo
Battery Voltage: 3.01 V
Brady Statistic AP VP Percent: 44.96 %
Brady Statistic AP VS Percent: 0 %
Brady Statistic AS VP Percent: 54.99 %
Brady Statistic AS VS Percent: 0.05 %
Brady Statistic RA Percent Paced: 44.93 %
Brady Statistic RV Percent Paced: 99.95 %
Date Time Interrogation Session: 20250902221518
Implantable Lead Connection Status: 753985
Implantable Lead Connection Status: 753985
Implantable Lead Implant Date: 20220824
Implantable Lead Implant Date: 20220824
Implantable Lead Location: 753859
Implantable Lead Location: 753860
Implantable Lead Model: 5076
Implantable Lead Model: 5076
Implantable Pulse Generator Implant Date: 20220824
Lead Channel Impedance Value: 285 Ohm
Lead Channel Impedance Value: 304 Ohm
Lead Channel Impedance Value: 399 Ohm
Lead Channel Impedance Value: 760 Ohm
Lead Channel Pacing Threshold Amplitude: 0.875 V
Lead Channel Pacing Threshold Amplitude: 0.875 V
Lead Channel Pacing Threshold Pulse Width: 0.4 ms
Lead Channel Pacing Threshold Pulse Width: 0.4 ms
Lead Channel Sensing Intrinsic Amplitude: 15 mV
Lead Channel Sensing Intrinsic Amplitude: 15 mV
Lead Channel Sensing Intrinsic Amplitude: 2.75 mV
Lead Channel Sensing Intrinsic Amplitude: 2.75 mV
Lead Channel Setting Pacing Amplitude: 1.75 V
Lead Channel Setting Pacing Amplitude: 2 V
Lead Channel Setting Pacing Pulse Width: 0.4 ms
Lead Channel Setting Sensing Sensitivity: 0.9 mV
Zone Setting Status: 755011

## 2024-01-07 NOTE — Progress Notes (Signed)
 Remote PPM Transmission

## 2024-01-10 DIAGNOSIS — N529 Male erectile dysfunction, unspecified: Secondary | ICD-10-CM | POA: Diagnosis not present

## 2024-01-10 DIAGNOSIS — I480 Paroxysmal atrial fibrillation: Secondary | ICD-10-CM | POA: Diagnosis not present

## 2024-01-10 DIAGNOSIS — Z0000 Encounter for general adult medical examination without abnormal findings: Secondary | ICD-10-CM | POA: Diagnosis not present

## 2024-01-10 DIAGNOSIS — E7800 Pure hypercholesterolemia, unspecified: Secondary | ICD-10-CM | POA: Diagnosis not present

## 2024-01-10 DIAGNOSIS — R634 Abnormal weight loss: Secondary | ICD-10-CM | POA: Diagnosis not present

## 2024-01-10 DIAGNOSIS — Z79899 Other long term (current) drug therapy: Secondary | ICD-10-CM | POA: Diagnosis not present

## 2024-01-10 DIAGNOSIS — I10 Essential (primary) hypertension: Secondary | ICD-10-CM | POA: Diagnosis not present

## 2024-01-10 DIAGNOSIS — E538 Deficiency of other specified B group vitamins: Secondary | ICD-10-CM | POA: Diagnosis not present

## 2024-01-12 DIAGNOSIS — E538 Deficiency of other specified B group vitamins: Secondary | ICD-10-CM | POA: Diagnosis not present

## 2024-01-19 DIAGNOSIS — N401 Enlarged prostate with lower urinary tract symptoms: Secondary | ICD-10-CM | POA: Diagnosis not present

## 2024-01-19 DIAGNOSIS — N5201 Erectile dysfunction due to arterial insufficiency: Secondary | ICD-10-CM | POA: Diagnosis not present

## 2024-01-19 DIAGNOSIS — R351 Nocturia: Secondary | ICD-10-CM | POA: Diagnosis not present

## 2024-01-30 ENCOUNTER — Other Ambulatory Visit: Payer: Self-pay | Admitting: Cardiology

## 2024-02-08 ENCOUNTER — Other Ambulatory Visit (HOSPITAL_COMMUNITY): Payer: Self-pay | Admitting: Physician Assistant

## 2024-02-13 DIAGNOSIS — Z23 Encounter for immunization: Secondary | ICD-10-CM | POA: Diagnosis not present

## 2024-02-13 DIAGNOSIS — E538 Deficiency of other specified B group vitamins: Secondary | ICD-10-CM | POA: Diagnosis not present

## 2024-03-01 ENCOUNTER — Ambulatory Visit (HOSPITAL_COMMUNITY)
Admission: RE | Admit: 2024-03-01 | Discharge: 2024-03-01 | Disposition: A | Discharge status: Home / Self Care | Source: Ambulatory Visit | Attending: Internal Medicine | Admitting: Internal Medicine

## 2024-03-01 ENCOUNTER — Encounter (HOSPITAL_COMMUNITY): Payer: Self-pay | Admitting: Internal Medicine

## 2024-03-01 VITALS — BP 148/100 | HR 66 | Ht 72.0 in | Wt 171.8 lb

## 2024-03-01 DIAGNOSIS — Z5181 Encounter for therapeutic drug level monitoring: Secondary | ICD-10-CM | POA: Diagnosis not present

## 2024-03-01 DIAGNOSIS — I4819 Other persistent atrial fibrillation: Secondary | ICD-10-CM

## 2024-03-01 DIAGNOSIS — Z79899 Other long term (current) drug therapy: Secondary | ICD-10-CM | POA: Diagnosis not present

## 2024-03-01 DIAGNOSIS — D6869 Other thrombophilia: Secondary | ICD-10-CM | POA: Diagnosis not present

## 2024-03-01 NOTE — Progress Notes (Signed)
 Primary Care Physician: Dwight Trula SQUIBB, MD Primary Cardiologist: None Electrophysiologist: Will Gladis Norton, MD  Referring Physician: Dr Norton Locus DEANDRAE WAJDA is a 88 y.o. male with a history of HTN, HLD, 2nd degree AV block s/p PPM, atrial fibrillation who presents for follow up in the [facility].  The patient was initially on flecainide  but this was discontinued due to QRS widening. Patient is on Eliquis  for a CHADS2VASC score of 3. S/p dofetilide  admission 10/1-10/4/24 with DCCV on 01/27/23.   On follow up 03/01/24, patient is here for Tikosyn  surveillance. He has had overall low Afib burden since last office visit. No missed doses of Tikosyn  or Eliquis .   Today, he denies symptoms of palpitations, chest pain, shortness of breath, orthopnea, PND, lower extremity edema, dizziness, presyncope, syncope, snoring, daytime somnolence, bleeding, or neurologic sequela. The patient is tolerating medications without difficulties and is otherwise without complaint today.    Atrial Fibrillation Risk Factors:  he does not have symptoms or diagnosis of sleep apnea. he does not have a history of rheumatic fever.   Atrial Fibrillation Management history:  Previous antiarrhythmic drugs: flecainide , dofetilide  Previous cardioversions: 04/29/17, 01/27/23 Previous ablations: none Anticoagulation history: Eliquis   ROS- All systems are reviewed and negative except as per the HPI above.  Past Medical History:  Diagnosis Date   Blood dyscrasia    hemachromatosis, takes tx q year   ED (erectile dysfunction)    GERD (gastroesophageal reflux disease)    Glaucoma    right eye   Hematuria, gross    History of kidney stones    Hyperlipidemia    Hypertension    Inguinal hernia    Paroxysmal atrial fibrillation (HCC)    Peyronie's disease    Pulmonic stenosis    mild   Tinnitus     Current Outpatient Medications  Medication Sig Dispense Refill   acetaminophen   (TYLENOL ) 500 MG tablet Take 500-1,000 mg by mouth every 6 (six) hours as needed (for pain.).     amLODipine  (NORVASC ) 5 MG tablet Take 5 mg by mouth at bedtime.     benazepril  (LOTENSIN ) 20 MG tablet TAKE 1 TABLET BY MOUTH EVERYDAY AT BEDTIME 90 tablet 0   brimonidine  (ALPHAGAN ) 0.2 % ophthalmic solution Place 1 drop into both eyes 2 (two) times daily.     Cholecalciferol  (VITAMIN D -3 PO) Take 1 capsule by mouth at bedtime.     CINNAMON PO Take 1 tablet by mouth at bedtime.     Coenzyme Q10 (CO Q-10 PO) Take 1 capsule by mouth at bedtime.     cyanocobalamin  (VITAMIN B12) 1000 MCG/ML injection Inject 1,000 mcg into the muscle every 30 (thirty) days.     dofetilide  (TIKOSYN ) 250 MCG capsule TAKE 1 CAPSULE BY MOUTH 2 TIMES DAILY. 180 capsule 3   ELIQUIS  5 MG TABS tablet TAKE 1 TABLET BY MOUTH TWICE A DAY 180 tablet 1   finasteride  (PROSCAR ) 5 MG tablet Take 5 mg by mouth at bedtime.     Lansoprazole (PREVACID 24HR PO) Take 1 capsule by mouth daily.     Probiotic Product (PROBIOTIC PO) Take 1 capsule by mouth daily.     simvastatin  (ZOCOR ) 20 MG tablet Take 10 mg by mouth at bedtime.     timolol  (TIMOPTIC ) 0.5 % ophthalmic solution Place 1 drop into both eyes 2 (two) times daily.     VYZULTA  0.024 % SOLN Place 1 drop into both eyes daily.  No current facility-administered medications for this encounter.    Physical Exam: BP (!) 148/100   Pulse 66   Ht 6' (1.829 m)   Wt 77.9 kg   BMI 23.30 kg/m   GEN- The patient is well appearing, alert and oriented x 3 today.   Neck - no JVD or carotid bruit noted Lungs- Clear to ausculation bilaterally, normal work of breathing Heart- Regular rate and rhythm, no murmurs, rubs or gallops, PMI not laterally displaced Extremities- no clubbing, cyanosis, or edema Skin - no rash or ecchymosis noted   Wt Readings from Last 3 Encounters:  03/01/24 77.9 kg  12/01/23 77.6 kg  09/01/23 80.2 kg     EKG today demonstrates  Vent. rate 66 BPM PR  interval 188 ms QRS duration 150 ms QT/QTcB 470/492 ms P-R-T axes 56 -85 80 Atrial-sensed ventricular-paced rhythm Abnormal ECG When compared with ECG of 01-Dec-2023 10:01, No significant change was found  Echo 10/29/20 demonstrated   1. Mild to moderate pulmonic stenosis is present. Vmax 3.3 m/s, MG 17  mmHG. Similar to prior echo. Appears at the level of the valve. Normal RV  size/function. Given the patient's age, would expect a benign course for  his pulmonic stenosis. Mild to moderate valvular pulmonic stenosis.   2. Left ventricular ejection fraction, by estimation, is 65 to 70%. Left  ventricular ejection fraction by 3D volume is 71 %. The left ventricle has  normal function. The left ventricle has no regional wall motion  abnormalities. Indeterminate diastolic  filling due to E-A fusion. The average left ventricular global  longitudinal strain is -26.6 %. The global longitudinal strain is normal.   3. Right ventricular systolic function is normal. The right ventricular  size is normal. Tricuspid regurgitation signal is inadequate for assessing  PA pressure.   4. The mitral valve is normal in structure. Trivial mitral valve  regurgitation. No evidence of mitral stenosis.   5. The aortic valve is tricuspid. Aortic valve regurgitation is not  visualized. No aortic stenosis is present.   6. The inferior vena cava is normal in size with greater than 50%  respiratory variability, suggesting right atrial pressure of 3 mmHg.   7. 2:1 AV block noted during the study.   Comparison(s): Changes from prior study are noted. EF remains unchanged.  Mild to moderate PS is present. 2:1 AV block present.    CHA2DS2-VASc Score = 3  The patient's score is based upon: CHF History: 0 HTN History: 1 Diabetes History: 0 Stroke History: 0 Vascular Disease History: 0 Age Score: 2 Gender Score: 0       ASSESSMENT AND PLAN: Persistent Atrial Fibrillation (ICD10:  I48.19) The patient's  CHA2DS2-VASc score is 3, indicating a 3.2% annual risk of stroke.   S/p dofetilide  admission 10/1-10/4/24 with DCCV on 01/27/23.  Patient is currently in AV paced rhythm. Most recent device check reassuring with no significant Afib burden.   High risk medication monitoring (ICD10: J342684) Patient requires ongoing monitoring for anti-arrhythmic medication which has the potential to cause life threatening arrhythmias or AV block. Qtc stable. Corrected 461 ms Continue Tikosyn  250 mcg BID. Bmet and mag drawn today.  Secondary Hypercoagulable State (ICD10:  D68.69) The patient is at significant risk for stroke/thromboembolism based upon his CHA2DS2-VASc Score of 3.  Continue Apixaban  (Eliquis ).  No missed doses.   HTN Elevated today, he notes BP diary with elevated readings. Recommended to speak with PCP can consider increase in amlodipine .   2nd degree AV block  S/p PPM, followed by Dr Inocencio and the device clinic.    Follow up 6 months for Tikosyn  surveillance.    Dorn Heinrich, PA-C Afib Clinic [facility] [address] [place], [place] [address] [phone]

## 2024-03-02 ENCOUNTER — Ambulatory Visit (HOSPITAL_COMMUNITY): Payer: Self-pay | Admitting: Internal Medicine

## 2024-03-02 LAB — BASIC METABOLIC PANEL WITH GFR
BUN/Creatinine Ratio: 14 (ref 10–24)
BUN: 13 mg/dL (ref 8–27)
CO2: 24 mmol/L (ref 20–29)
Calcium: 9.8 mg/dL (ref 8.6–10.2)
Chloride: 104 mmol/L (ref 96–106)
Creatinine, Ser: 0.91 mg/dL (ref 0.76–1.27)
Glucose: 85 mg/dL (ref 70–99)
Potassium: 4.1 mmol/L (ref 3.5–5.2)
Sodium: 141 mmol/L (ref 134–144)
eGFR: 82 mL/min/1.73 (ref >59)

## 2024-03-02 LAB — MAGNESIUM: Magnesium: 2.2 mg/dL (ref 1.6–2.3)

## 2024-03-15 DIAGNOSIS — E538 Deficiency of other specified B group vitamins: Secondary | ICD-10-CM | POA: Diagnosis not present

## 2024-03-28 ENCOUNTER — Ambulatory Visit

## 2024-03-28 DIAGNOSIS — I4819 Other persistent atrial fibrillation: Secondary | ICD-10-CM | POA: Diagnosis not present

## 2024-03-29 ENCOUNTER — Ambulatory Visit: Payer: Self-pay | Admitting: Cardiology

## 2024-03-29 LAB — CUP PACEART REMOTE DEVICE CHECK
Battery Remaining Longevity: 124 mo
Battery Voltage: 3.01 V
Brady Statistic AP VP Percent: 41.86 %
Brady Statistic AP VS Percent: 0 %
Brady Statistic AS VP Percent: 58.04 %
Brady Statistic AS VS Percent: 0.09 %
Brady Statistic RA Percent Paced: 41.86 %
Brady Statistic RV Percent Paced: 99.91 %
Date Time Interrogation Session: 20251204094139
Implantable Lead Connection Status: 753985
Implantable Lead Connection Status: 753985
Implantable Lead Implant Date: 20220824
Implantable Lead Implant Date: 20220824
Implantable Lead Location: 753859
Implantable Lead Location: 753860
Implantable Lead Model: 5076
Implantable Lead Model: 5076
Implantable Pulse Generator Implant Date: 20220824
Lead Channel Impedance Value: 285 Ohm
Lead Channel Impedance Value: 285 Ohm
Lead Channel Impedance Value: 399 Ohm
Lead Channel Impedance Value: 532 Ohm
Lead Channel Pacing Threshold Amplitude: 0.625 V
Lead Channel Pacing Threshold Amplitude: 1 V
Lead Channel Pacing Threshold Pulse Width: 0.4 ms
Lead Channel Pacing Threshold Pulse Width: 0.4 ms
Lead Channel Sensing Intrinsic Amplitude: 15 mV
Lead Channel Sensing Intrinsic Amplitude: 15 mV
Lead Channel Sensing Intrinsic Amplitude: 2.625 mV
Lead Channel Sensing Intrinsic Amplitude: 2.625 mV
Lead Channel Setting Pacing Amplitude: 1.5 V
Lead Channel Setting Pacing Amplitude: 2 V
Lead Channel Setting Pacing Pulse Width: 0.4 ms
Lead Channel Setting Sensing Sensitivity: 0.9 mV
Zone Setting Status: 755011

## 2024-03-30 DIAGNOSIS — H401133 Primary open-angle glaucoma, bilateral, severe stage: Secondary | ICD-10-CM | POA: Diagnosis not present

## 2024-03-30 DIAGNOSIS — Z961 Presence of intraocular lens: Secondary | ICD-10-CM | POA: Diagnosis not present

## 2024-03-30 DIAGNOSIS — H52203 Unspecified astigmatism, bilateral: Secondary | ICD-10-CM | POA: Diagnosis not present

## 2024-03-30 DIAGNOSIS — H33301 Unspecified retinal break, right eye: Secondary | ICD-10-CM | POA: Diagnosis not present

## 2024-04-03 NOTE — Progress Notes (Signed)
 Remote PPM Transmission

## 2024-05-16 ENCOUNTER — Other Ambulatory Visit: Payer: Self-pay | Admitting: Cardiology

## 2024-05-31 ENCOUNTER — Other Ambulatory Visit: Payer: Self-pay

## 2024-05-31 ENCOUNTER — Other Ambulatory Visit (HOSPITAL_COMMUNITY): Payer: Self-pay | Admitting: Physician Assistant

## 2024-05-31 ENCOUNTER — Ambulatory Visit

## 2024-05-31 ENCOUNTER — Emergency Department

## 2024-05-31 ENCOUNTER — Emergency Department
Admission: EM | Admit: 2024-05-31 | Discharge: 2024-05-31 | Disposition: A | Discharge status: Home / Self Care | Attending: Emergency Medicine | Admitting: Emergency Medicine

## 2024-05-31 DIAGNOSIS — S0990XA Unspecified injury of head, initial encounter: Secondary | ICD-10-CM | POA: Insufficient documentation

## 2024-05-31 DIAGNOSIS — Z7901 Long term (current) use of anticoagulants: Secondary | ICD-10-CM | POA: Insufficient documentation

## 2024-05-31 DIAGNOSIS — W1839XA Other fall on same level, initial encounter: Secondary | ICD-10-CM | POA: Insufficient documentation

## 2024-05-31 DIAGNOSIS — W19XXXA Unspecified fall, initial encounter: Secondary | ICD-10-CM

## 2024-05-31 NOTE — ED Triage Notes (Signed)
 Pt states that he fell backwards 1 week ago and wants a CT scan to make sure everything is ok. No symptoms reported. Pt takes Eliquis .

## 2024-05-31 NOTE — ED Provider Notes (Signed)
" ° °  [facility] Provider Note    Event Date/Time   First MD Initiated Contact with Patient 05/31/24 1731     (approximate)   History   Fall   HPI  Brian Horn is a 89 y.o. male who reports fall with head injury which occurred a couple of days ago, he is on blood thinner.  He is here for CT scan referred by his PCP     Physical Exam   Triage Vital Signs: ED Triage Vitals  Encounter Vitals Group     BP 05/31/24 1542 (!) 117/103     Girls Systolic BP Percentile --      Girls Diastolic BP Percentile --      Boys Systolic BP Percentile --      Boys Diastolic BP Percentile --      Pulse Rate 05/31/24 1542 75     Resp 05/31/24 1542 19     Temp 05/31/24 1542 (!) 97.4 F (36.3 C)     Temp Source 05/31/24 1542 Oral     SpO2 05/31/24 1542 95 %     Weight 05/31/24 1540 78.5 kg (173 lb)     Height 05/31/24 1540 1.829 m (6')     Head Circumference --      Peak Flow --      Pain Score 05/31/24 1540 0     Pain Loc --      Pain Education --      Exclude from Growth Chart --     Most recent vital signs: Vitals:   05/31/24 1542  BP: (!) 117/103  Pulse: 75  Resp: 19  Temp: (!) 97.4 F (36.3 C)  SpO2: 95%     General: Awake, no distress.  CV:  Good peripheral perfusion.  Resp:  Normal effort.  Abd:  No distention.  Other:  Normal neurologic exam, cranial nerves II through XII are normal, EOMI   ED Results / Procedures / Treatments   Labs (all labs ordered are listed, but only abnormal results are displayed) Labs Reviewed - No data to display   EKG     RADIOLOGY CT head viewed interpreted by me, no acute abnormality, confirmed by radiology    PROCEDURES:  Critical Care performed:   Procedures   MEDICATIONS ORDERED IN ED: Medications - No data to display   IMPRESSION / MDM / ASSESSMENT AND PLAN / ED COURSE  I reviewed the triage vital signs and the nursing notes. Patient's presentation is most consistent with acute  presentation with potential threat to life or bodily function.  Patient presents after fall with head injury while on blood thinners.  Referred by PCP for CT head  Imaging obtained and is reassuring, his exam is unremarkable.  He is quite impatient for discharge.        FINAL CLINICAL IMPRESSION(S) / ED DIAGNOSES   Final diagnoses:  Fall, initial encounter  Injury of head, initial encounter     Rx / DC Orders   ED Discharge Orders     None        Note:  This document was prepared using Dragon voice recognition software and may include unintentional dictation errors.   Arlander Charleston, MD 05/31/24 2224  "

## 2024-06-27 ENCOUNTER — Ambulatory Visit

## 2024-08-28 ENCOUNTER — Ambulatory Visit (HOSPITAL_COMMUNITY): Admitting: Internal Medicine

## 2024-09-26 ENCOUNTER — Ambulatory Visit

## 2024-12-26 ENCOUNTER — Ambulatory Visit
